# Patient Record
Sex: Female | Born: 1960 | Race: White | Hispanic: No | Marital: Married | Smoking: Former smoker
Health system: Southern US, Community
[De-identification: ages and names within clinical notes are randomized; demographics above are authoritative.]

## PROBLEM LIST (undated history)

## (undated) DIAGNOSIS — D219 Benign neoplasm of connective and other soft tissue, unspecified: Secondary | ICD-10-CM

## (undated) DIAGNOSIS — T8859XA Other complications of anesthesia, initial encounter: Secondary | ICD-10-CM

## (undated) DIAGNOSIS — R7303 Prediabetes: Secondary | ICD-10-CM

## (undated) DIAGNOSIS — F419 Anxiety disorder, unspecified: Secondary | ICD-10-CM

## (undated) DIAGNOSIS — R011 Cardiac murmur, unspecified: Secondary | ICD-10-CM

## (undated) DIAGNOSIS — K219 Gastro-esophageal reflux disease without esophagitis: Secondary | ICD-10-CM

## (undated) DIAGNOSIS — M1990 Unspecified osteoarthritis, unspecified site: Secondary | ICD-10-CM

## (undated) DIAGNOSIS — F32A Depression, unspecified: Secondary | ICD-10-CM

## (undated) DIAGNOSIS — T4145XA Adverse effect of unspecified anesthetic, initial encounter: Secondary | ICD-10-CM

## (undated) DIAGNOSIS — J302 Other seasonal allergic rhinitis: Secondary | ICD-10-CM

## (undated) DIAGNOSIS — E349 Endocrine disorder, unspecified: Secondary | ICD-10-CM

## (undated) DIAGNOSIS — H9190 Unspecified hearing loss, unspecified ear: Secondary | ICD-10-CM

## (undated) DIAGNOSIS — A6920 Lyme disease, unspecified: Secondary | ICD-10-CM

## (undated) HISTORY — DX: Benign neoplasm of connective and other soft tissue, unspecified: D21.9

## (undated) HISTORY — DX: Lyme disease, unspecified: A69.20

## (undated) HISTORY — PX: APPENDECTOMY: SHX54

## (undated) HISTORY — DX: Unspecified osteoarthritis, unspecified site: M19.90

## (undated) HISTORY — PX: COCHLEAR IMPLANT: SUR684

## (undated) HISTORY — DX: Gastro-esophageal reflux disease without esophagitis: K21.9

## (undated) HISTORY — DX: Endocrine disorder, unspecified: E34.9

---

## 1997-12-20 HISTORY — PX: TUBAL LIGATION: SHX77

## 2006-04-17 ENCOUNTER — Emergency Department (HOSPITAL_COMMUNITY): Admission: EM | Admit: 2006-04-17 | Discharge: 2006-04-17 | Payer: Self-pay | Admitting: *Deleted

## 2006-08-24 ENCOUNTER — Ambulatory Visit: Payer: Self-pay | Admitting: Internal Medicine

## 2006-08-26 ENCOUNTER — Other Ambulatory Visit: Admission: RE | Admit: 2006-08-26 | Discharge: 2006-08-26 | Payer: Self-pay | Admitting: Obstetrics & Gynecology

## 2006-09-06 ENCOUNTER — Ambulatory Visit: Payer: Self-pay | Admitting: Internal Medicine

## 2006-10-05 ENCOUNTER — Ambulatory Visit: Payer: Self-pay | Admitting: Internal Medicine

## 2006-12-05 ENCOUNTER — Ambulatory Visit: Payer: Self-pay | Admitting: Internal Medicine

## 2006-12-24 ENCOUNTER — Encounter: Payer: Self-pay | Admitting: Internal Medicine

## 2006-12-24 DIAGNOSIS — K589 Irritable bowel syndrome without diarrhea: Secondary | ICD-10-CM

## 2006-12-24 DIAGNOSIS — G5600 Carpal tunnel syndrome, unspecified upper limb: Secondary | ICD-10-CM

## 2006-12-24 DIAGNOSIS — N949 Unspecified condition associated with female genital organs and menstrual cycle: Secondary | ICD-10-CM

## 2006-12-24 DIAGNOSIS — J309 Allergic rhinitis, unspecified: Secondary | ICD-10-CM | POA: Insufficient documentation

## 2006-12-24 DIAGNOSIS — M129 Arthropathy, unspecified: Secondary | ICD-10-CM | POA: Insufficient documentation

## 2006-12-24 DIAGNOSIS — K219 Gastro-esophageal reflux disease without esophagitis: Secondary | ICD-10-CM | POA: Insufficient documentation

## 2006-12-24 DIAGNOSIS — H8109 Meniere's disease, unspecified ear: Secondary | ICD-10-CM | POA: Insufficient documentation

## 2006-12-24 DIAGNOSIS — E119 Type 2 diabetes mellitus without complications: Secondary | ICD-10-CM

## 2006-12-24 DIAGNOSIS — G43909 Migraine, unspecified, not intractable, without status migrainosus: Secondary | ICD-10-CM | POA: Insufficient documentation

## 2007-07-17 ENCOUNTER — Encounter (INDEPENDENT_AMBULATORY_CARE_PROVIDER_SITE_OTHER): Payer: Self-pay | Admitting: Internal Medicine

## 2008-03-08 ENCOUNTER — Ambulatory Visit: Payer: Self-pay | Admitting: Internal Medicine

## 2008-03-08 DIAGNOSIS — K625 Hemorrhage of anus and rectum: Secondary | ICD-10-CM

## 2008-03-08 LAB — CONVERTED CEMR LAB: Hemoglobin: 12.9 g/dL

## 2009-09-17 ENCOUNTER — Ambulatory Visit (HOSPITAL_COMMUNITY): Admission: RE | Admit: 2009-09-17 | Discharge: 2009-09-17 | Payer: Self-pay | Admitting: Internal Medicine

## 2009-09-25 ENCOUNTER — Ambulatory Visit (HOSPITAL_COMMUNITY): Admission: RE | Admit: 2009-09-25 | Discharge: 2009-09-25 | Payer: Self-pay | Admitting: Internal Medicine

## 2012-08-01 ENCOUNTER — Other Ambulatory Visit (HOSPITAL_COMMUNITY): Payer: Self-pay | Admitting: Internal Medicine

## 2012-08-01 DIAGNOSIS — R109 Unspecified abdominal pain: Secondary | ICD-10-CM

## 2012-08-07 ENCOUNTER — Other Ambulatory Visit (HOSPITAL_COMMUNITY): Payer: Self-pay

## 2012-08-10 ENCOUNTER — Ambulatory Visit (HOSPITAL_COMMUNITY)
Admission: RE | Admit: 2012-08-10 | Discharge: 2012-08-10 | Disposition: A | Discharge status: Home / Self Care | Payer: BC Managed Care – PPO | Source: Ambulatory Visit | Attending: Internal Medicine | Admitting: Internal Medicine

## 2012-08-10 DIAGNOSIS — R110 Nausea: Secondary | ICD-10-CM | POA: Insufficient documentation

## 2012-08-10 DIAGNOSIS — R109 Unspecified abdominal pain: Secondary | ICD-10-CM

## 2013-05-15 ENCOUNTER — Other Ambulatory Visit (HOSPITAL_COMMUNITY): Payer: Self-pay | Admitting: Internal Medicine

## 2013-05-15 DIAGNOSIS — Z139 Encounter for screening, unspecified: Secondary | ICD-10-CM

## 2013-06-07 ENCOUNTER — Ambulatory Visit (HOSPITAL_COMMUNITY)
Admission: RE | Admit: 2013-06-07 | Discharge: 2013-06-07 | Disposition: A | Discharge status: Home / Self Care | Payer: BC Managed Care – PPO | Source: Ambulatory Visit | Attending: Internal Medicine | Admitting: Internal Medicine

## 2013-06-07 DIAGNOSIS — Z1231 Encounter for screening mammogram for malignant neoplasm of breast: Secondary | ICD-10-CM | POA: Insufficient documentation

## 2013-06-07 DIAGNOSIS — Z139 Encounter for screening, unspecified: Secondary | ICD-10-CM

## 2013-10-05 ENCOUNTER — Emergency Department (HOSPITAL_COMMUNITY): Payer: BC Managed Care – PPO

## 2013-10-05 ENCOUNTER — Emergency Department (HOSPITAL_COMMUNITY)
Admission: EM | Admit: 2013-10-05 | Discharge: 2013-10-05 | Disposition: A | Discharge status: Home / Self Care | Payer: BC Managed Care – PPO | Attending: Emergency Medicine | Admitting: Emergency Medicine

## 2013-10-05 ENCOUNTER — Encounter (HOSPITAL_COMMUNITY): Payer: Self-pay | Admitting: Emergency Medicine

## 2013-10-05 ENCOUNTER — Encounter: Payer: Self-pay | Admitting: Gynecology

## 2013-10-05 DIAGNOSIS — S161XXA Strain of muscle, fascia and tendon at neck level, initial encounter: Secondary | ICD-10-CM

## 2013-10-05 DIAGNOSIS — S39012A Strain of muscle, fascia and tendon of lower back, initial encounter: Secondary | ICD-10-CM

## 2013-10-05 DIAGNOSIS — Z79899 Other long term (current) drug therapy: Secondary | ICD-10-CM | POA: Insufficient documentation

## 2013-10-05 DIAGNOSIS — S335XXA Sprain of ligaments of lumbar spine, initial encounter: Secondary | ICD-10-CM | POA: Insufficient documentation

## 2013-10-05 DIAGNOSIS — Y92410 Unspecified street and highway as the place of occurrence of the external cause: Secondary | ICD-10-CM | POA: Insufficient documentation

## 2013-10-05 DIAGNOSIS — S139XXA Sprain of joints and ligaments of unspecified parts of neck, initial encounter: Secondary | ICD-10-CM | POA: Insufficient documentation

## 2013-10-05 DIAGNOSIS — Y9389 Activity, other specified: Secondary | ICD-10-CM | POA: Insufficient documentation

## 2013-10-05 HISTORY — DX: Unspecified hearing loss, unspecified ear: H91.90

## 2013-10-05 MED ORDER — CYCLOBENZAPRINE HCL 10 MG PO TABS
10.0000 mg | ORAL_TABLET | Freq: Once | ORAL | Status: AC
  Administered 2013-10-05: 10 mg via ORAL
  Filled 2013-10-05: qty 1

## 2013-10-05 MED ORDER — CYCLOBENZAPRINE HCL 10 MG PO TABS: 10.0000 mg | ORAL_TABLET | Freq: Three times a day (TID) | ORAL | Status: DC | PRN
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
## 2013-10-05 NOTE — ED Notes (Signed)
Pt seen and eval by EDPa for initial assessment. 
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
## 2013-10-08 NOTE — ED Provider Notes (Signed)
Medical screening examination/treatment/procedure(s) were performed by non-physician practitioner and as supervising physician I was immediately available for consultation/collaboration.   Lou Loewe W Flint Hakeem, MD 10/08/13 2223 

## 2013-10-18 ENCOUNTER — Ambulatory Visit (INDEPENDENT_AMBULATORY_CARE_PROVIDER_SITE_OTHER): Payer: BC Managed Care – PPO | Admitting: Certified Nurse Midwife

## 2013-10-18 ENCOUNTER — Encounter: Payer: Self-pay | Admitting: Certified Nurse Midwife

## 2013-10-18 VITALS — BP 122/86 | HR 80 | Resp 18 | Ht 68.0 in | Wt 220.0 lb

## 2013-10-18 DIAGNOSIS — Z0000 Encounter for general adult medical examination without abnormal findings: Secondary | ICD-10-CM

## 2013-10-18 DIAGNOSIS — B373 Candidiasis of vulva and vagina: Secondary | ICD-10-CM

## 2013-10-18 DIAGNOSIS — Z01419 Encounter for gynecological examination (general) (routine) without abnormal findings: Secondary | ICD-10-CM

## 2013-10-18 DIAGNOSIS — M1990 Unspecified osteoarthritis, unspecified site: Secondary | ICD-10-CM | POA: Insufficient documentation

## 2013-10-18 LAB — POCT URINALYSIS DIPSTICK

## 2013-10-18 MED ORDER — NYSTATIN-TRIAMCINOLONE 100000-0.1 UNIT/GM-% EX CREA: TOPICAL_CREAM | Freq: Two times a day (BID) | CUTANEOUS | Status: DC

## 2013-10-18 NOTE — Progress Notes (Signed)
52 y.o. G6P1001 Married Caucasian Fe here for annual exam.  Periods regular until this month with no period this month so far. Patient has menstrual migraine once monthly,uses Excedrin migraine with great response. Patient is partially deaf, hears slightly, loss started at age 20. Functions well with speech. Patient does follow up as indicated from audiology. Patient complaining of vaginal itching that PCP/ and dermatology had been treating since 5/14. No new personal products. Patient describes as external itching only.  Patient's last menstrual period was 09/03/2013.          Sexually active: yes  The current method of family planning (spouse 23)   Exercising: yes  walking 2-4 miles/wk , lift weights 3x/wk, sit ups 3x/wk Smoker:  no  Health Maintenance: Pap:  2012/2013 Normal  Per patient MMG:  04/2013 negative per patient Colonoscopy:  3 years ago-Normal BMD:   Not recently TDaP:  Not sure  Labs: Hgb: PCP ; Urine: Leuks 2   reports that she has never smoked. She does not have any smokeless tobacco history on file. She reports that she does not drink alcohol or use illicit drugs.  Past Medical History  Diagnosis Date  . Hearing deficit   . Fibroid     Breast  . Hormone disorder   . Arthritis     Past Surgical History  Procedure Laterality Date  . Appendectomy    . Tubal ligation  1991    Current Outpatient Prescriptions  Medication Sig Dispense Refill  . chlorpheniramine (CHLOR-TRIMETON) 4 MG tablet Take 4 mg by mouth daily.      Marland Kitchen esomeprazole (NEXIUM) 20 MG capsule Take 20 mg by mouth daily before breakfast.      . mupirocin ointment (BACTROBAN) 2 % Apply topically as needed.      . Vitamin D, Ergocalciferol, (DRISDOL) 50000 UNITS CAPS capsule Take 50,000 Units by mouth every 7 (seven) days.      . cyclobenzaprine (FLEXERIL) 10 MG tablet Take 1 tablet (10 mg total) by mouth 3 (three) times daily as needed.  21 tablet  0   No current facility-administered medications for  this visit.    Family History  Problem Relation Age of Onset  . Adopted: Yes  . Breast cancer Mother     Late 40's-40's  . Lupus Maternal Aunt     ROS:  Pertinent items are noted in HPI.  Otherwise, a comprehensive ROS was negative.  Exam:   BP 122/86  Pulse 80  Resp 18  Ht 5\' 8"  (1.727 m)  Wt 220 lb (99.791 kg)  BMI 33.46 kg/m2  LMP 09/03/2013 Height: 5\' 8"  (172.7 cm)  Ht Readings from Last 3 Encounters:  10/18/13 5\' 8"  (1.727 m)  10/05/13 5\' 7"  (1.702 m)  12/05/06 5\' 9"  (1.753 m)    General appearance: alert, cooperative and appears stated age Head: Normocephalic, without obvious abnormality, atraumatic Neck: no adenopathy, supple, symmetrical, trachea midline and thyroid normal to inspection and palpation Lungs: clear to auscultation bilaterally Breasts: normal appearance, no masses or tenderness, No nipple retraction or dimpling, No nipple discharge or bleeding, No axillary or supraclavicular adenopathy Heart: regular rate and rhythm Abdomen: soft, non-tender; no masses,  no organomegaly Extremities: extremities normal, atraumatic, no cyanosis or edema Skin: Skin color, texture, turgor normal. No rashes or lesions Lymph nodes: Cervical, supraclavicular, and axillary nodes normal. No abnormal inguinal nodes palpated Neurologic: Grossly normal   Pelvic: External genitalia:  no lesions, red with scaling and exudate.  Urethra:  normal appearing urethra with no masses, tenderness or lesions              Bartholin's and Skene's: normal                 Vagina: normal appearing vagina with normal color and discharge, no lesions              Cervix: normal, non tender              Pap taken: yes Bimanual Exam:  Uterus:  normal size, contour, position, consistency, mobility, non-tender and anteverted              Adnexa: normal adnexa and no mass, fullness, tenderness               Rectovaginal: Confirms               Anus:  normal sphincter tone, no  lesions  A:  Well Woman with normal exam  Contraception none (spouse 70)  Progressive hearing loss ? Etiology being followed by audiologist, must face patient and speak very loud  Yeast vulvitis  P:   Reviewed health and wellness pertinent to exam  Reviewed findings of yeast and probable cause of itching. Rx Mycolog cream see order. Discussed aveeno sitz bath prn comfort if needed. Questions addressed. Change underwear when becomes moist to avoid increase risk of.  pap smear as per guidelines   Mammogram yearly, Discussed genetic screening available with history of early breast cancer in mother(patient adopted, only history given) pap smear taken today with HPVHR counseled on breast self exam, mammography screening, adequate intake of calcium and vitamin D, diet and exercise  return annually  Rv recheck yeast 2 weeks  An After Visit Summary was printed and given to the patient.

## 2013-10-18 NOTE — Patient Instructions (Signed)

## 2013-10-19 NOTE — Progress Notes (Signed)
Note reviewed, agree with plan.  Kataryna Mcquilkin, MD  

## 2013-10-22 LAB — IPS PAP TEST WITH HPV

## 2013-11-02 ENCOUNTER — Ambulatory Visit (INDEPENDENT_AMBULATORY_CARE_PROVIDER_SITE_OTHER): Payer: BC Managed Care – PPO | Admitting: Certified Nurse Midwife

## 2013-11-02 VITALS — BP 100/62 | HR 68 | Resp 16 | Ht 69.0 in | Wt 221.0 lb

## 2013-11-02 DIAGNOSIS — B373 Candidiasis of vulva and vagina: Secondary | ICD-10-CM

## 2013-11-02 NOTE — Progress Notes (Signed)
52 y.o. Married Caucasian female G1P1001 here for follow up of yeast vaginitis treated with Mycolog  initiated on October 18, 2013. Completed all medication as directed.  Denies any symptoms of vaginal itching, burning or discharge. Patient has not started menses yet, but is aware to notify if no menses by mid December. No other health issues  O: Healthy WD,WN female Affect: Normal, hearing impaired  Pelvic exam:EXTERNAL GENITALIA: normal appearing vulva with no masses, tenderness or lesions wet prep negative VAGINA: no abnormal discharge or lesions, Wet Prep/KOH no pathogens and ph 4.0 CERVIX: no lesions or cervical motion tenderness and normal, non tender UTERUS: normal ADNEXA: no masses palpable, nontender and adnexa normal  A:Yeast vulvitis resolved  Perimenopausal with amenorrhea Contraception: spouse vasectomy  P: Discussed findings of negative wet prep, patient reassured. Instructed to call if period occurs or does not occur by mid December.   RV prn

## 2013-11-06 NOTE — Progress Notes (Signed)
Note reviewed, agree with plan.  Jearld Hemp, MD  

## 2013-12-27 ENCOUNTER — Encounter (INDEPENDENT_AMBULATORY_CARE_PROVIDER_SITE_OTHER): Payer: Self-pay | Admitting: *Deleted

## 2014-01-10 ENCOUNTER — Encounter (INDEPENDENT_AMBULATORY_CARE_PROVIDER_SITE_OTHER): Payer: Self-pay | Admitting: *Deleted

## 2014-01-10 ENCOUNTER — Ambulatory Visit (INDEPENDENT_AMBULATORY_CARE_PROVIDER_SITE_OTHER): Payer: BC Managed Care – PPO | Admitting: Internal Medicine

## 2014-01-10 ENCOUNTER — Other Ambulatory Visit (INDEPENDENT_AMBULATORY_CARE_PROVIDER_SITE_OTHER): Payer: Self-pay | Admitting: *Deleted

## 2014-01-10 ENCOUNTER — Encounter (INDEPENDENT_AMBULATORY_CARE_PROVIDER_SITE_OTHER): Payer: Self-pay | Admitting: Internal Medicine

## 2014-01-10 VITALS — BP 112/66 | HR 72 | Temp 98.2°F | Ht 67.0 in | Wt 221.3 lb

## 2014-01-10 DIAGNOSIS — K219 Gastro-esophageal reflux disease without esophagitis: Secondary | ICD-10-CM | POA: Insufficient documentation

## 2014-01-10 NOTE — Patient Instructions (Signed)
EGD with DR. Rehman

## 2014-01-10 NOTE — Progress Notes (Addendum)
Subjective:     Patient ID: Brandi Farley, female   DOB: [birth year], 53 y.o.   MRN: [id number]  HPI Referred to our office by Dr Nevada Crane for uncontrolled GERD. She has been on Protonix Symptoms started about a year ago. She was started on Nexium but really did not help her symptoms. . She says the Protonix relieves her symptoms. She stopped the Protonix for 3 days to see if her symptoms would return and they did.  She had epigastric tenderness.  If feels like someone is poking her. She developed this pain after being under stress. She says it is hard to swallow at time. She does have some nausea. She is not having dysphagia. Appetite is okay. No weight loss. Usually has a BM about once a day.  No melena or bright red rectal bleeding.  06/29/2013 H and H 13.8 and 83.8  Review of Systems see hpi Current Outpatient Prescriptions  Medication Sig Dispense Refill  . Aspirin-Acetaminophen-Caffeine (EXCEDRIN MIGRAINE PO) Take by mouth.      . chlorpheniramine (CHLOR-TRIMETON) 4 MG tablet Take 4 mg by mouth daily.      Marland Kitchen guaiFENesin (MUCINEX) 600 MG 12 hr tablet Take by mouth 2 (two) times daily as needed.      . hydrochlorothiazide (HYDRODIURIL) 25 MG tablet Take 25 mg by mouth daily.      Marland Kitchen ibuprofen (ADVIL,MOTRIN) 200 MG tablet Take 200 mg by mouth every 6 (six) hours as needed.      . mupirocin ointment (BACTROBAN) 2 % Apply topically as needed.      . nystatin-triamcinolone (MYCOLOG II) cream Apply 1 application topically 2 (two) times daily.      Marland Kitchen oxymetazoline (AFRIN) 0.05 % nasal spray Place 1 spray into both nostrils 2 (two) times daily.      . pantoprazole (PROTONIX) 40 MG tablet Take 40 mg by mouth daily.      . Vitamin D, Ergocalciferol, (DRISDOL) 50000 UNITS CAPS capsule Take 50,000 Units by mouth every 7 (seven) days.       No current facility-administered medications for this visit.   Past Medical History  Diagnosis Date  . Hearing deficit   . Fibroid     Breast  . Hormone  disorder   . Arthritis    Past Surgical History  Procedure Laterality Date  . Appendectomy    . Tubal ligation  1991   Allergies  Allergen Reactions  . Bactrim [Sulfamethoxazole-Tmp Ds] Shortness Of Breath and Other (See Comments)    Cramping  . Doryx [Doxycycline] Shortness Of Breath  . Keflex [Cephalexin] Shortness Of Breath     Married, one child. Is a librian     Objective:   Physical Exam  Filed Vitals:   01/10/14 1133  BP: 112/66  Pulse: 72  Temp: 98.2 F (36.8 C)  Height: 5\' 7"  (1.702 m)  Weight: 221 lb 4.8 oz (100.381 kg)   Alert and oriented. Skin warm and dry. Oral mucosa is moist.   . Sclera anicteric, conjunctivae is pink. Thyroid not enlarged. No cervical lymphadenopathy. Lungs clear. Heart regular rate and rhythm.  Abdomen is soft. Bowel sounds are positive. No hepatomegaly. No abdominal masses felt. No tenderness.  No edema to lower extremities.       Assessment:   GERD controlled at this time. PUD needs to be ruled out.     Plan:     EGD with Dr Laural Golden.

## 2014-02-06 ENCOUNTER — Encounter (HOSPITAL_COMMUNITY): Payer: Self-pay | Admitting: *Deleted

## 2014-02-06 ENCOUNTER — Encounter (HOSPITAL_COMMUNITY)
Admission: RE | Disposition: A | Discharge status: Home / Self Care | Payer: Self-pay | Source: Ambulatory Visit | Attending: Internal Medicine

## 2014-02-06 ENCOUNTER — Ambulatory Visit (HOSPITAL_COMMUNITY)
Admission: RE | Admit: 2014-02-06 | Discharge: 2014-02-06 | Disposition: A | Discharge status: Home / Self Care | Payer: BC Managed Care – PPO | Source: Ambulatory Visit | Attending: Internal Medicine | Admitting: Internal Medicine

## 2014-02-06 DIAGNOSIS — R1013 Epigastric pain: Secondary | ICD-10-CM | POA: Insufficient documentation

## 2014-02-06 DIAGNOSIS — K449 Diaphragmatic hernia without obstruction or gangrene: Secondary | ICD-10-CM | POA: Insufficient documentation

## 2014-02-06 DIAGNOSIS — K2960 Other gastritis without bleeding: Secondary | ICD-10-CM

## 2014-02-06 DIAGNOSIS — Z87891 Personal history of nicotine dependence: Secondary | ICD-10-CM | POA: Insufficient documentation

## 2014-02-06 DIAGNOSIS — K219 Gastro-esophageal reflux disease without esophagitis: Secondary | ICD-10-CM

## 2014-02-06 HISTORY — PX: ESOPHAGOGASTRODUODENOSCOPY: SHX5428

## 2014-02-06 HISTORY — DX: Other seasonal allergic rhinitis: J30.2

## 2014-02-06 SURGERY — EGD (ESOPHAGOGASTRODUODENOSCOPY)
Anesthesia: Moderate Sedation

## 2014-02-06 MED ORDER — STERILE WATER FOR IRRIGATION IR SOLN
Status: DC | PRN
  Administered 2014-02-06: 12:00:00

## 2014-02-06 MED ORDER — MEPERIDINE HCL 50 MG/ML IJ SOLN
INTRAMUSCULAR | Status: DC | PRN
  Administered 2014-02-06 (×2): 25 mg via INTRAVENOUS

## 2014-02-06 MED ORDER — SODIUM CHLORIDE 0.9 % IV SOLN
INTRAVENOUS | Status: DC
  Administered 2014-02-06: 12:00:00 via INTRAVENOUS

## 2014-02-06 MED ORDER — MIDAZOLAM HCL 5 MG/5ML IJ SOLN
INTRAMUSCULAR | Status: DC | PRN
  Administered 2014-02-06 (×2): 2 mg via INTRAVENOUS
  Administered 2014-02-06: 1 mg via INTRAVENOUS

## 2014-02-06 MED ORDER — MIDAZOLAM HCL 5 MG/5ML IJ SOLN
INTRAMUSCULAR | Status: AC
  Filled 2014-02-06: qty 10

## 2014-02-06 MED ORDER — BUTAMBEN-TETRACAINE-BENZOCAINE 2-2-14 % EX AERO
INHALATION_SPRAY | CUTANEOUS | Status: DC | PRN
  Administered 2014-02-06: 1 via TOPICAL

## 2014-02-06 MED ORDER — MEPERIDINE HCL 50 MG/ML IJ SOLN
INTRAMUSCULAR | Status: AC
  Filled 2014-02-06: qty 1

## 2014-02-06 NOTE — Discharge Instructions (Signed)
Resume usual medications and diet. No driving for 24 hours. Physician will contact you with results of blood test.  Gastrointestinal Endoscopy Care After Refer to this sheet in the next few weeks. These instructions provide you with information on caring for yourself after your procedure. Your caregiver may also give you more specific instructions. Your treatment has been planned according to current medical practices, but problems sometimes occur. Call your caregiver if you have any problems or questions after your procedure. HOME CARE INSTRUCTIONS  If you were given medicine to help you relax (sedative), do not drive, operate machinery, or sign important documents for 24 hours.  Avoid alcohol and hot or warm beverages for the first 24 hours after the procedure.  Only take over-the-counter or prescription medicines for pain, discomfort, or fever as directed by your caregiver. You may resume taking your normal medicines unless your caregiver tells you otherwise. Ask your caregiver when you may resume taking medicines that may cause bleeding, such as aspirin, clopidogrel, or warfarin.  You may return to your normal diet and activities on the day after your procedure, or as directed by your caregiver. Walking may help to reduce any bloated feeling in your abdomen.  Drink enough fluids to keep your urine clear or pale yellow.  You may gargle with salt water if you have a sore throat. SEEK IMMEDIATE MEDICAL CARE IF:  You have severe nausea or vomiting.  You have severe abdominal pain, abdominal cramps that last longer than 6 hours, or abdominal swelling (distention).  You have severe shoulder or back pain.  You have trouble swallowing.  You have shortness of breath, your breathing is shallow, or you are breathing faster than normal.  You have a fever or a rapid heartbeat.  You vomit blood or material that looks like coffee grounds.  You have bloody, black, or tarry stools. MAKE SURE  YOU:  Understand these instructions.  Will watch your condition.  Will get help right away if you are not doing well or get worse.

## 2014-02-06 NOTE — H&P (Signed)
Brandi Farley is an 53 y.o. female.   Chief Complaint: Patient is here for EGD. HPI: Patient is 53 year old Caucasian female who presents with intermittent epigastric pain few months duration. She describes as if somebody is poking this area with knife. He has history of heartburn. Nexium did not help but she is doing better with pantoprazole. She denies dysphagia nausea vomiting melena or rectal bleeding. She rarely takes OTC Advil. Ultrasound on August 2013 was negative for cholelithiasis .  Past Medical History  Diagnosis Date  . Hearing deficit   . Fibroid     Breast  . Hormone disorder   . Arthritis   . Seasonal allergies     Past Surgical History  Procedure Laterality Date  . Appendectomy    . Tubal ligation  1991    Family History  Problem Relation Age of Onset  . Adopted: Yes  . Breast cancer Mother     Late 30's-40's, brain tumor  . Lupus Maternal Aunt    Social History:  reports that she has quit smoking. She does not have any smokeless tobacco history on file. She reports that she does not drink alcohol or use illicit drugs.  Allergies:  Allergies  Allergen Reactions  . Bactrim [Sulfamethoxazole-Tmp Ds] Shortness Of Breath and Other (See Comments)    Cramping  . Doryx [Doxycycline] Shortness Of Breath  . Keflex [Cephalexin] Shortness Of Breath    Medications Prior to Admission  Medication Sig Dispense Refill  . cetirizine (ZYRTEC) 10 MG tablet Take 10 mg by mouth daily as needed for allergies.      . chlorpheniramine (CHLOR-TRIMETON) 4 MG tablet Take 4 mg by mouth daily.      . hydrochlorothiazide (HYDRODIURIL) 25 MG tablet Take 25 mg by mouth daily.      . mupirocin ointment (BACTROBAN) 2 % Apply topically as needed.      . nystatin-triamcinolone (MYCOLOG II) cream Apply 1 application topically 2 (two) times daily.      Marland Kitchen oxymetazoline (AFRIN) 0.05 % nasal spray Place 1 spray into both nostrils 2 (two) times daily.      . pantoprazole (PROTONIX) 40 MG  tablet Take 40 mg by mouth daily.      . Vitamin D, Ergocalciferol, (DRISDOL) 50000 UNITS CAPS capsule Take 50,000 Units by mouth every 7 (seven) days.      . Aspirin-Acetaminophen-Caffeine (EXCEDRIN MIGRAINE PO) Take by mouth.      Marland Kitchen guaiFENesin (MUCINEX) 600 MG 12 hr tablet Take by mouth 2 (two) times daily as needed.      Marland Kitchen ibuprofen (ADVIL,MOTRIN) 200 MG tablet Take 200 mg by mouth every 6 (six) hours as needed.        No results found for this or any previous visit (from the past 48 hour(s)). No results found.  ROS  Blood pressure 135/71, pulse 65, temperature 97.9 F (36.6 C), temperature source Oral, resp. rate 15, height 5\' 7"  (1.702 m), weight 221 lb (100.245 kg), last menstrual period 02/01/2014, SpO2 98.00%. Physical Exam  Constitutional: She appears well-developed and well-nourished.  HENT:  Mouth/Throat: Oropharynx is clear and moist.  Eyes: Conjunctivae are normal. No scleral icterus.  Neck: No thyromegaly present.  Cardiovascular: Normal rate, regular rhythm and normal heart sounds.   No murmur heard. Respiratory: Effort normal and breath sounds normal.  GI: Soft. She exhibits no distension and no mass. There is no tenderness.  Musculoskeletal: She exhibits no edema.  Lymphadenopathy:    She has no cervical adenopathy.  Neurological: She is alert.  Skin: Skin is warm and dry.     Assessment/Plan Epigastric pain. Chronic GERD. Diagnostic EGD.  Brandi Farley 02/06/2014, 12:14 PM

## 2014-02-06 NOTE — Op Note (Signed)
EGD PROCEDURE REPORT  PATIENT:  Brandi Farley  MR#:  [id number] Birthdate:  20-Mar-1961, 53 y.o., female Endoscopist:  Dr. Rogene Houston, MD Referred By:  Dr. Wende Neighbors, MD Procedure Date: 02/06/2014  Procedure:   EGD  Indications:  Patient is 53 year old Caucasian female who presents with recurrent epigastric pain. 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 Ultrasound 18 months ago was negative for cholelithiasis.            Informed Consent:  The risks, benefits, alternatives & imponderables which include, but are not limited to, bleeding, infection, perforation, drug reaction and potential missed lesion have been reviewed.  The potential for biopsy, lesion removal, esophageal dilation, etc. have also been discussed.  Questions have been answered.  All parties agreeable.  Please see history & physical in medical record for more information.  Medications:  Demerol 50 mg IV Versed 5 mg IV Cetacaine spray topically for oropharyngeal anesthesia  Description of procedure:  The endoscope was introduced through the mouth and advanced to the second portion of the duodenum without difficulty or limitations. The mucosal surfaces were surveyed very carefully during advancement of the scope and upon withdrawal.  Findings:  Esophagus:  Mucosa of the esophagus was normal. GE junction was unremarkable. GEJ:  35 cm Hiatus:  39 cm Stomach: Stomach was empty and distended very well with insufflation. Folds in the proximal stomach were normal. Examination of mucosa at gastric body was normal. Few small antral erosions noted but no ulcer crater identified. Pyloric channel was patent. Angularis fundus and cardia were unremarkable. Duodenum:  Normal bulbar and post bulbar mucosa.  Therapeutic/Diagnostic Maneuvers Performed:  None  Complications:  None  Impression: Small sliding hiatal hernia. Erosive antral gastritis but no evidence of peptic ulcer disease.  Recommendations:   H. pylori serology. Patient will continue anti-reflux measures and pantoprazole as before.  Burney Calzadilla U  02/06/2014  12:34 PM  CC: Dr. Delphina Cahill, MD & Dr. Rayne Du ref. provider found

## 2014-02-07 ENCOUNTER — Encounter (HOSPITAL_COMMUNITY): Payer: Self-pay | Admitting: Internal Medicine

## 2014-02-07 LAB — H. PYLORI ANTIBODY, IGG: H Pylori IgG: <0.4 {ISR}

## 2014-02-13 ENCOUNTER — Telehealth (INDEPENDENT_AMBULATORY_CARE_PROVIDER_SITE_OTHER): Payer: Self-pay | Admitting: *Deleted

## 2014-02-13 NOTE — Telephone Encounter (Signed)
Would like to get her lab results. Dr. Laural Golden order blood work when she was at the hosptial for her procedure. Alfa would like for her husband to be called and given the results. She is at work during the day and death. It takes a little longer to speak with her. The return phone number is [phone].

## 2014-02-13 NOTE — Telephone Encounter (Signed)
I called and spoke with the patient's husband. I gave him the results of the H- Pylori ,Negative. I told him that his wife was to continue Anti Reflux Measures and to take the Pantoprazole as before. He was told that she Erosive Gastris but no peptic ulcer disease noted. He ask if she was to have any further test since nothing was found to cause her problems? Mr. Altman was advised that this would be addressed with Dr.Rehman, any further recommendations we would return the call.

## 2014-02-15 NOTE — Telephone Encounter (Signed)
Patient,s call return. There no answer when I called Patient's home number and her husband's number earlier in the week.

## 2014-03-11 ENCOUNTER — Ambulatory Visit (INDEPENDENT_AMBULATORY_CARE_PROVIDER_SITE_OTHER): Payer: BC Managed Care – PPO | Admitting: Certified Nurse Midwife

## 2014-03-11 ENCOUNTER — Encounter: Payer: Self-pay | Admitting: Certified Nurse Midwife

## 2014-03-11 VITALS — BP 118/64 | HR 68 | Resp 16 | Ht 69.0 in | Wt 226.0 lb

## 2014-03-11 DIAGNOSIS — L723 Sebaceous cyst: Secondary | ICD-10-CM

## 2014-03-11 DIAGNOSIS — B372 Candidiasis of skin and nail: Secondary | ICD-10-CM

## 2014-03-11 NOTE — Patient Instructions (Signed)
Epidermal Cyst An epidermal cyst is sometimes called a sebaceous cyst, epidermal inclusion cyst, or infundibular cyst. These cysts usually contain a substance that looks "pasty" or "cheesy" and may have a bad smell. This substance is a protein called keratin. Epidermal cysts are usually found on the face, neck, or trunk. They may also occur in the vaginal area or other parts of the genitalia of both men and women. Epidermal cysts are usually small, painless, slow-growing bumps or lumps that move freely under the skin. It is important not to try to pop them. This may cause an infection and lead to tenderness and swelling. CAUSES  Epidermal cysts may be caused by a deep penetrating injury to the skin or a plugged hair follicle, often associated with acne. SYMPTOMS  Epidermal cysts can become inflamed and cause:  Redness.  Tenderness.  Increased temperature of the skin over the bumps or lumps.  Grayish-white, bad smelling material that drains from the bump or lump. DIAGNOSIS  Epidermal cysts are easily diagnosed by your caregiver during an exam. Rarely, a tissue sample (biopsy) may be taken to rule out other conditions that may resemble epidermal cysts. TREATMENT   Epidermal cysts often get better and disappear on their own. They are rarely ever cancerous.  If a cyst becomes infected, it may become inflamed and tender. This may require opening and draining the cyst. Treatment with antibiotics may be necessary. When the infection is gone, the cyst may be removed with minor surgery.  Small, inflamed cysts can often be treated with antibiotics or by injecting steroid medicines.  Sometimes, epidermal cysts become large and bothersome. If this happens, surgical removal in your caregiver's office may be necessary. HOME CARE INSTRUCTIONS  Only take over-the-counter or prescription medicines as directed by your caregiver.  Epsom salt soak an do squeeze area SEEK MEDICAL CARE IF:   Your cyst  becomes tender, red, or swollen.  Your condition is not improving or is getting worse.  You have any other questions or concerns. MAKE SURE YOU:  Understand these instructions.  Will watch your condition.  Will get help right away if you are not doing well or get worse. Document Released: 11/06/2004 Document Revised: 02/28/2012 Document Reviewed: 06/14/2011 [facility] Patient Information 2014 [place], [place]. Monilial Vaginitis Vaginitis in a soreness, swelling and redness (inflammation) of the vagina and vulva. Monilial vaginitis is not a sexually transmitted infection. CAUSES  Yeast vaginitis is caused by yeast (candida) that is normally found in your vagina. With a yeast infection, the candida has overgrown in number to a point that upsets the chemical balance. SYMPTOMS   White, thick vaginal discharge.  Swelling, itching, redness and irritation of the vagina and possibly the lips of the vagina (vulva).  Burning or painful urination.  Painful intercourse. DIAGNOSIS  Things that may contribute to monilial vaginitis are:  Postmenopausal and virginal states.  Pregnancy.  Infections.  Being tired, sick or stressed, especially if you had monilial vaginitis in the past.  Diabetes. Good control will help lower the chance.  Birth control pills.  Tight fitting garments.  Using bubble bath, feminine sprays, douches or deodorant tampons.  Taking certain medications that kill germs (antibiotics).  Sporadic recurrence can occur if you become ill. TREATMENT  Your caregiver will give you medication.  There are several kinds of anti monilial vaginal creams and suppositories specific for monilial vaginitis. For recurrent yeast infections, use a suppository or cream in the vagina 2 times a week, or as directed.  Anti-monilial or  steroid cream for the itching or irritation of the vulva may also be used. Get your caregiver's permission.  Painting the vagina with methylene blue  solution may help if the monilial cream does not work.  Eating yogurt may help prevent monilial vaginitis. HOME CARE INSTRUCTIONS   Finish all medication as prescribed.  Do not have sex until treatment is completed or after your caregiver tells you it is okay.  Take warm sitz baths.  Do not douche.  Do not use tampons, especially scented ones.  Wear cotton underwear.  Avoid tight pants and panty hose.  Tell your sexual partner that you have a yeast infection. They should go to their caregiver if they have symptoms such as mild rash or itching.  Your sexual partner should be treated as well if your infection is difficult to eliminate.  Practice safer sex. Use condoms.  Some vaginal medications cause latex condoms to fail. Vaginal medications that harm condoms are:  Cleocin cream.  Butoconazole (Femstat).  Terconazole (Terazol) vaginal suppository.  Miconazole (Monistat) (may be purchased over the counter). SEEK MEDICAL CARE IF:   You have a temperature by mouth above 102 F (38.9 C).  The infection is getting worse after 2 days of treatment.  The infection is not getting better after 3 days of treatment.  You develop blisters in or around your vagina.  You develop vaginal bleeding, and it is not your menstrual period.  You have pain when you urinate.  You develop intestinal problems.  You have pain with sexual intercourse. Document Released: 09/15/2005 Document Revised: 02/28/2012 Document Reviewed: 05/30/2009 [facility] Patient Information 2014 [place], [place].

## 2014-03-11 NOTE — Progress Notes (Signed)
52 y.o.Married Caucasian female G1P1001 with a off and on 2 months history of the following:vulvar itching, no vaginal itching or discharge or burning with period each month. Patient uses pads for period. Patient is not on period today. Sexually active: yes  Pt also reports the following associated symptoms: small bump on pubic hair area when this occurs with period. Patient has tried over the counter treatment of Monistat internal cream with minimal relief. No new personal products .No other health issues.  O: Healthy female WDWN Affect: normal, orientation x 3 Bilateral hearing impaired with hearing aids. Verbalized understanding of questions.    Exam:Mons pubis area on right small healing sebaceous cyst noted, not red,non tender, no exudate Inguinal lymph nodes not enlarged or tender   KCM:[id number], Urethra, Skene's normal, vulva slightly red with scaling, wet prep taken  no exudate, non tender, no lesions                Vag:no lesions, discharge: normal and physiologic, pH 3.5, wet prep done                Cx:  normal appearance and non tender                Uterus:normal size, mid position                Adnexa: normal adnexa and no mass, fullness, tenderness  Wet Prep shows:negative vaginal, positvie yeast external vulva   A: Yeast dermatitis of vulva Sebaceous cyst   P: Reviewed findings of yeast dermatitis as she has had before. Patient still has Rx Mycolg cream and instructed to apply bid X 5 days to area.  Aveeno sitz bath prn.  Try changing menstrual pads more frequently or a different brand. Reviewed findings and usually will resolve without treatment. Avoid squeezing area. Can put epsom salt soak to area to help with resolving when occurs. Questions addressed. Handout given regarding both concerns.   Rv prn

## 2014-03-11 NOTE — Progress Notes (Signed)
Reviewed personally.  M. Suzanne Sebastian Dzik, MD.  

## 2014-03-18 ENCOUNTER — Other Ambulatory Visit: Payer: Self-pay | Admitting: Certified Nurse Midwife

## 2014-03-18 NOTE — Telephone Encounter (Signed)
Last AEX 10/18/2013 Last refill 10/18/2013 # 60g/0 refills No future appt.  Please approve or deny Rx.

## 2014-05-24 ENCOUNTER — Other Ambulatory Visit: Payer: Self-pay | Admitting: Obstetrics & Gynecology

## 2014-05-24 MED ORDER — NYSTATIN-TRIAMCINOLONE 100000-0.1 UNIT/GM-% EX CREA: 1.0000 "application " | TOPICAL_CREAM | Freq: Two times a day (BID) | CUTANEOUS | Status: DC

## 2014-05-24 NOTE — Telephone Encounter (Signed)
Patient request a refill of "vaginal cream", patient is not sure of the name. Pharmacy on file.

## 2014-05-24 NOTE — Telephone Encounter (Signed)
Last refilled 03/11/14 at [facility] Last AEX: 10/18/13  No AEX scheduled  Please advise

## 2014-05-27 NOTE — Telephone Encounter (Signed)
Patient notified that rx has been sent. 

## 2014-06-13 ENCOUNTER — Encounter (INDEPENDENT_AMBULATORY_CARE_PROVIDER_SITE_OTHER): Payer: Self-pay

## 2014-08-16 ENCOUNTER — Other Ambulatory Visit: Payer: Self-pay | Admitting: Certified Nurse Midwife

## 2014-08-16 NOTE — Telephone Encounter (Signed)
Last AEX 10/18/13 Last refill 05/24/14 30g/0 R No future appt  Please approve

## 2014-10-21 ENCOUNTER — Encounter: Payer: Self-pay | Admitting: Certified Nurse Midwife

## 2014-12-03 ENCOUNTER — Other Ambulatory Visit: Payer: Self-pay | Admitting: Nurse Practitioner

## 2014-12-03 NOTE — Telephone Encounter (Signed)
Medication refill request: Mycolog II Last AEX:  10/18/13 with Ms. Debbie Next AEX:  AEX scheduled for 12/27/13 with Ms. Debbie  Last MMG (if hormonal medication request): N/A Refill authorized: #30 gm   S/w patient and scheduled her for AEX 12/27/13 at 2:15, patient was wondering if Ms. Debbie could send in a refill to last her until AEX, she only has a little bit left.  Please advise.

## 2014-12-27 ENCOUNTER — Ambulatory Visit: Payer: BC Managed Care – PPO | Admitting: Certified Nurse Midwife

## 2015-01-29 IMAGING — CR DG CERVICAL SPINE COMPLETE 4+V
6 series · 6 of 6 positions shown · non-contrast
Comparison: None.

CLINICAL DATA: Pain post trauma

EXAM:
CERVICAL SPINE  4+ VIEWS

[view not recorded (1 of 6)]
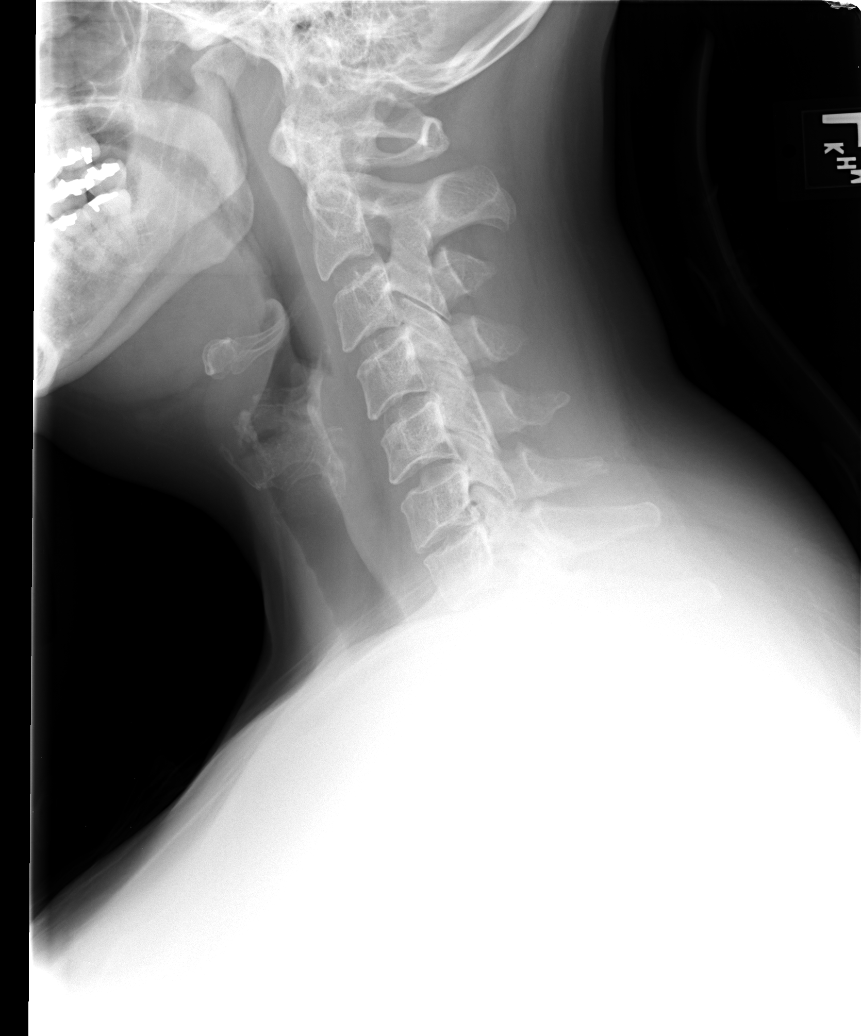

[view not recorded (2 of 6)]
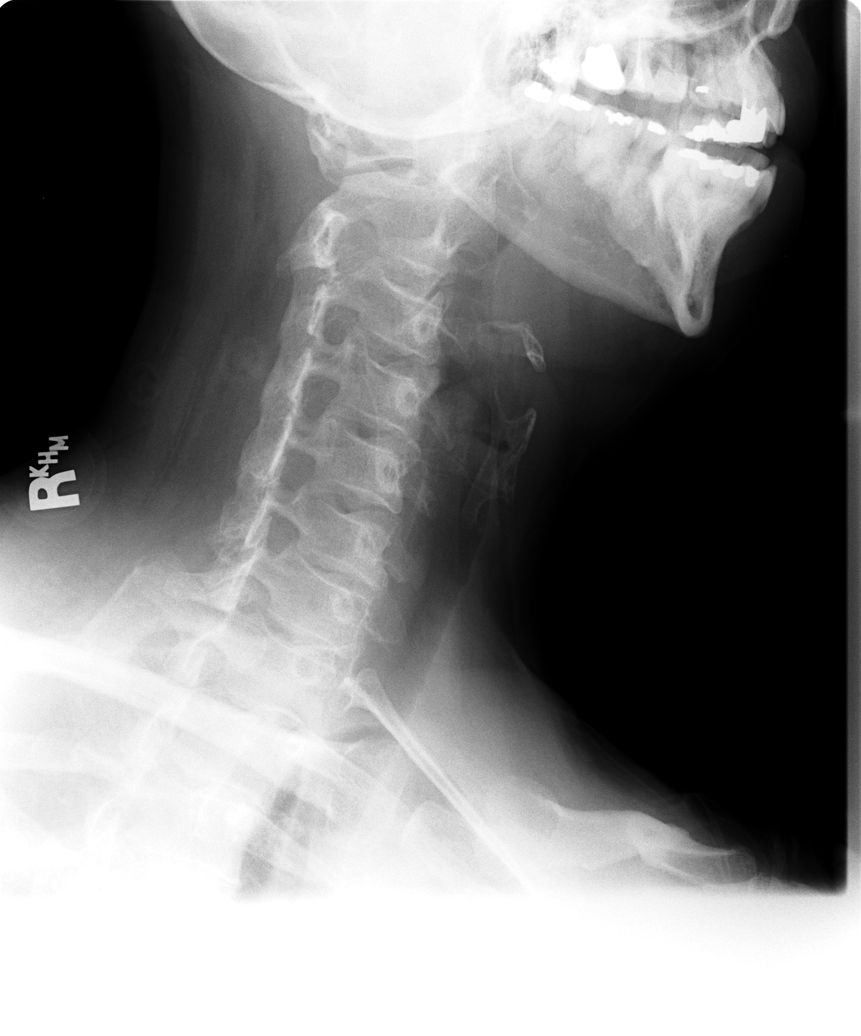

[view not recorded (3 of 6)]
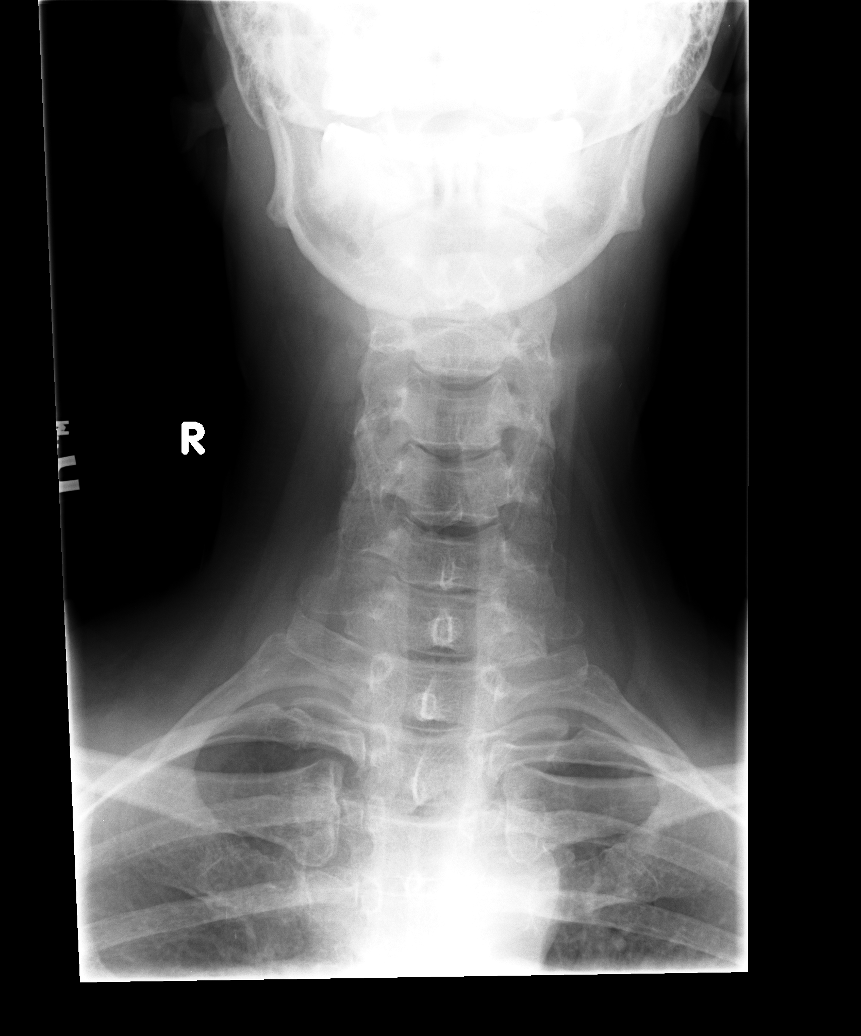

[view not recorded (4 of 6)]
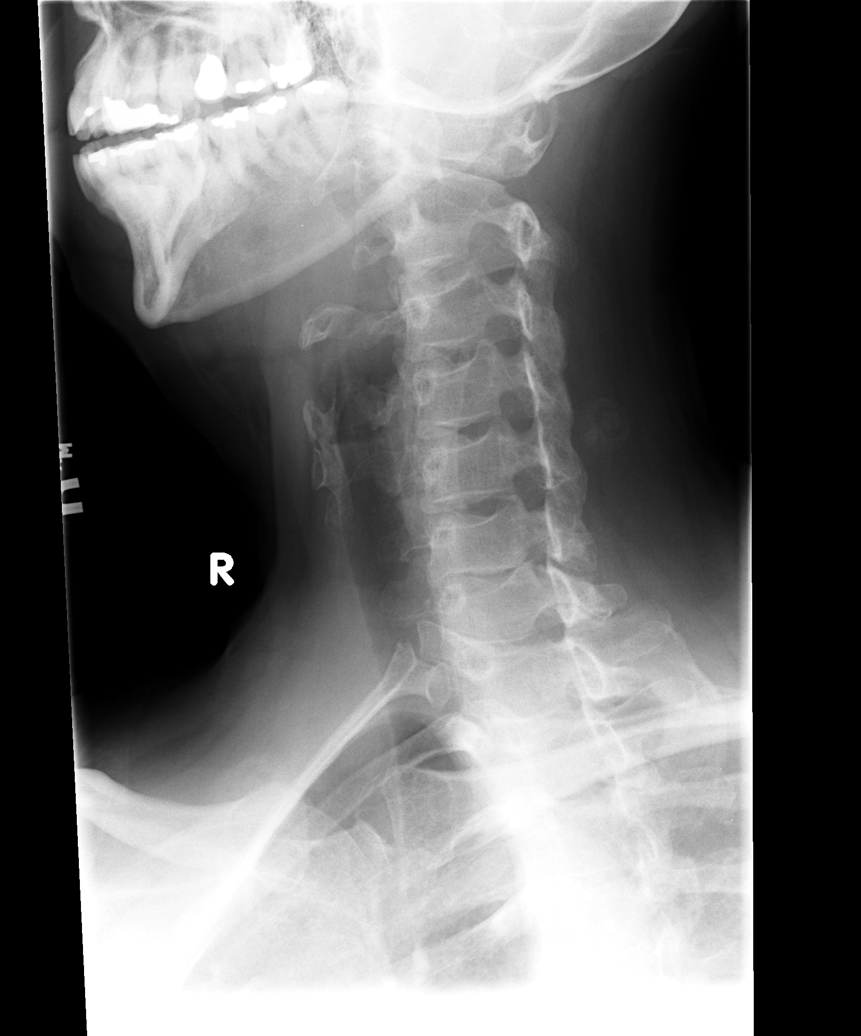

[view not recorded (5 of 6)]
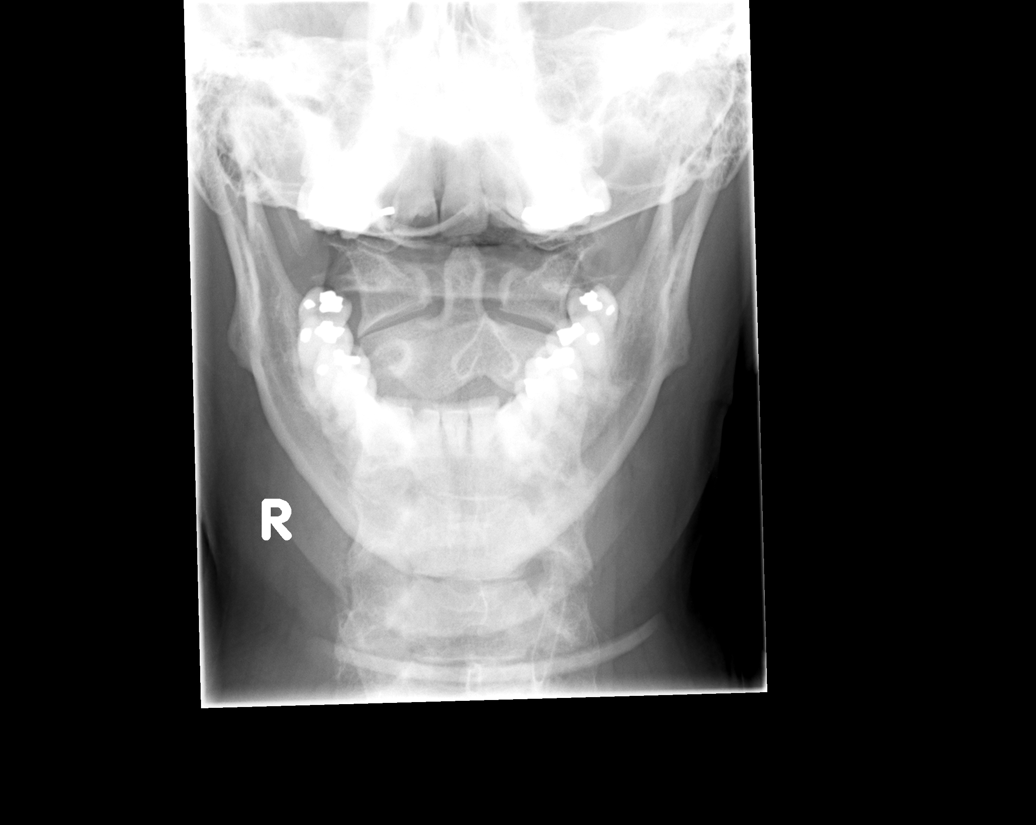

[view not recorded (6 of 6)]
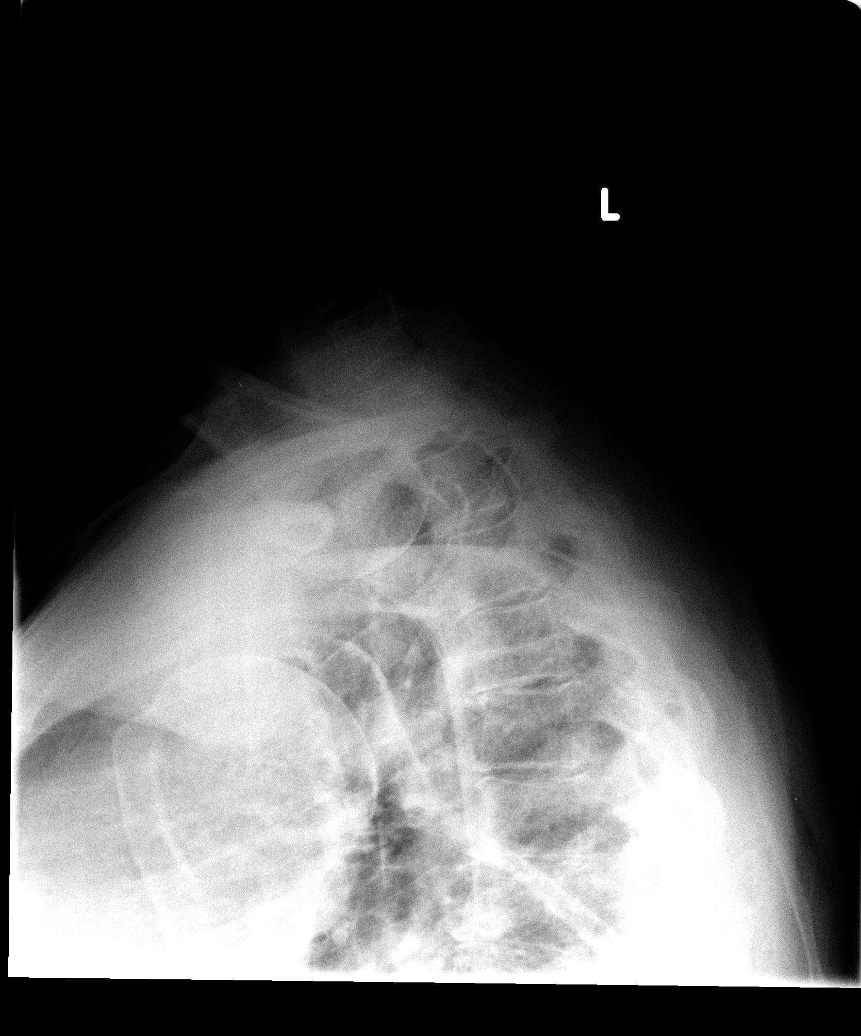

[6 of 6 positions shown; findings below may reference images not displayed]

FINDINGS: Frontal, lateral, open-mouth odontoid, and bilateral oblique views
were obtained. There is no fracture or spondylolisthesis.
Prevertebral soft tissues and predental space regions are normal.
There is moderate disc space narrowing at C6-7. Other disc spaces
appear normal. There is the exit foraminal narrowing on the left at
C6-7 due to bony hypertrophy.
IMPRESSION: Osteoarthritic changes C6-7. No fracture or spondylolisthesis.

## 2015-02-21 ENCOUNTER — Encounter: Payer: Self-pay | Admitting: Certified Nurse Midwife

## 2015-02-21 ENCOUNTER — Ambulatory Visit (INDEPENDENT_AMBULATORY_CARE_PROVIDER_SITE_OTHER): Payer: BC Managed Care – PPO | Admitting: Certified Nurse Midwife

## 2015-02-21 VITALS — BP 118/62 | HR 72 | Resp 16 | Ht 67.75 in | Wt 234.0 lb

## 2015-02-21 DIAGNOSIS — Z0000 Encounter for general adult medical examination without abnormal findings: Secondary | ICD-10-CM | POA: Diagnosis not present

## 2015-02-21 DIAGNOSIS — Z124 Encounter for screening for malignant neoplasm of cervix: Secondary | ICD-10-CM

## 2015-02-21 DIAGNOSIS — Z01419 Encounter for gynecological examination (general) (routine) without abnormal findings: Secondary | ICD-10-CM

## 2015-02-21 DIAGNOSIS — N912 Amenorrhea, unspecified: Secondary | ICD-10-CM

## 2015-02-21 DIAGNOSIS — N951 Menopausal and female climacteric states: Secondary | ICD-10-CM

## 2015-02-21 LAB — POCT URINALYSIS DIPSTICK
BILIRUBIN UA: NEGATIVE
Blood, UA: NEGATIVE
Glucose, UA: NEGATIVE
KETONES UA: NEGATIVE
LEUKOCYTES UA: NEGATIVE
NITRITE UA: NEGATIVE
Protein, UA: NEGATIVE
UROBILINOGEN UA: NEGATIVE
pH, UA: 5

## 2015-02-21 NOTE — Patient Instructions (Signed)
EXERCISE AND DIET:  We recommended that you start or continue a regular exercise program for good health. Regular exercise means any activity that makes your heart beat faster and makes you sweat.  We recommend exercising at least 30 minutes per day at least 3 days a week, preferably 4 or 5.  We also recommend a diet low in fat and sugar.  Inactivity, poor dietary choices and obesity can cause diabetes, heart attack, stroke, and kidney damage, among others.    ALCOHOL AND SMOKING:  Women should limit their alcohol intake to no more than 7 drinks/beers/glasses of wine (combined, not each!) per week. Moderation of alcohol intake to this level decreases your risk of breast cancer and liver damage. And of course, no recreational drugs are part of a healthy lifestyle.  And absolutely no smoking or even second hand smoke. Most people know smoking can cause heart and lung diseases, but did you know it also contributes to weakening of your bones? Aging of your skin?  Yellowing of your teeth and nails?  CALCIUM AND VITAMIN D:  Adequate intake of calcium and Vitamin D are recommended.  The recommendations for exact amounts of these supplements seem to change often, but generally speaking 600 mg of calcium (either carbonate or citrate) and 800 units of Vitamin D per day seems prudent. Certain women may benefit from higher intake of Vitamin D.  If you are among these women, your doctor will have told you during your visit.    PAP SMEARS:  Pap smears, to check for cervical cancer or precancers,  have traditionally been done yearly, although recent scientific advances have shown that most women can have pap smears less often.  However, every woman still should have a physical exam from her gynecologist every year. It will include a breast check, inspection of the vulva and vagina to check for abnormal growths or skin changes, a visual exam of the cervix, and then an exam to evaluate the size and shape of the uterus and  ovaries.  And after 52 years of age, a rectal exam is indicated to check for rectal cancers. We will also provide age appropriate advice regarding health maintenance, like when you should have certain vaccines, screening for sexually transmitted diseases, bone density testing, colonoscopy, mammograms, etc.   MAMMOGRAMS:  All women over 52 years old should have a yearly mammogram. Many facilities now offer a "3D" mammogram, which may cost around $50 extra out of pocket. If possible,  we recommend you accept the option to have the 3D mammogram performed.  It both reduces the number of women who will be called back for extra views which then turn out to be normal, and it is better than the routine mammogram at detecting truly abnormal areas.    COLONOSCOPY:  Colonoscopy to screen for colon cancer is recommended for all women at age 52.  We know, you hate the idea of the prep.  We agree, BUT, having colon cancer and not knowing it is worse!!  Colon cancer so often starts as a polyp that can be seen and removed at colonscopy, which can quite literally save your life!  And if your first colonoscopy is normal and you have no family history of colon cancer, most women don't have to have it again for 10 years.  Once every ten years, you can do something that may end up saving your life, right?  We will be happy to help you get it scheduled when you are ready.    Be sure to check your insurance coverage so you understand how much it will cost.  It may be covered as a preventative service at no cost, but you should check your particular policy.     Perimenopause Perimenopause is the time when your body begins to move into the menopause (no menstrual period for 12 straight months). It is a natural process. Perimenopause can begin 2-8 years before the menopause and usually lasts for 1 year after the menopause. During this time, your ovaries may or may not produce an egg. The ovaries vary in their production of estrogen and  progesterone hormones each month. This can cause irregular menstrual periods, difficulty getting pregnant, vaginal bleeding between periods, and uncomfortable symptoms. CAUSES  Irregular production of the ovarian hormones, estrogen and progesterone, and not ovulating every month.  Other causes include:  Tumor of the pituitary gland in the brain.  Medical disease that affects the ovaries.  Radiation treatment.  Chemotherapy.  Unknown causes.  Heavy smoking and excessive alcohol intake can bring on perimenopause sooner. SIGNS AND SYMPTOMS   Hot flashes.  Night sweats.  Irregular menstrual periods.  Decreased sex drive.  Vaginal dryness.  Headaches.  Mood swings.  Depression.  Memory problems.  Irritability.  Tiredness.  Weight gain.  Trouble getting pregnant.  The beginning of losing bone cells (osteoporosis).  The beginning of hardening of the arteries (atherosclerosis). DIAGNOSIS  Your health care provider will make a diagnosis by analyzing your age, menstrual history, and symptoms. He or she will do a physical exam and note any changes in your body, especially your female organs. Female hormone tests may or may not be helpful depending on the amount of female hormones you produce and when you produce them. However, other hormone tests may be helpful to rule out other problems. TREATMENT  In some cases, no treatment is needed. The decision on whether treatment is necessary during the perimenopause should be made by you and your health care provider based on how the symptoms are affecting you and your lifestyle. Various treatments are available, such as:  Treating individual symptoms with a specific medicine for that symptom.  Herbal medicines that can help specific symptoms.  Counseling.  Group therapy. HOME CARE INSTRUCTIONS   Keep track of your menstrual periods (when they occur, how heavy they are, how long between periods, and how long they last) as  well as your symptoms and when they started.  Only take over-the-counter or prescription medicines as directed by your health care provider.  Sleep and rest.  Exercise.  Eat a diet that contains calcium (good for your bones) and soy (acts like the estrogen hormone).  Do not smoke.  Avoid alcoholic beverages.  Take vitamin supplements as recommended by your health care provider. Taking vitamin E may help in certain cases.  Take calcium and vitamin D supplements to help prevent bone loss.  Group therapy is sometimes helpful.  Acupuncture may help in some cases. SEEK MEDICAL CARE IF:   You have questions about any symptoms you are having.  You need a referral to a specialist (gynecologist, psychiatrist, or psychologist). SEEK IMMEDIATE MEDICAL CARE IF:   You have vaginal bleeding.  Your period lasts longer than 8 days.  Your periods are recurring sooner than 21 days.  You have bleeding after intercourse.  You have severe depression.  You have pain when you urinate.  You have severe headaches.  You have vision problems. Document Released: 01/13/2005 Document Revised: 09/26/2013 Document Reviewed: 07/05/2013 [employer]  Patient Information 2015 [employer]. This information is not intended to replace advice given to you by your health care provider. Make sure you discuss any questions you have with your health care provider.  

## 2015-02-21 NOTE — Progress Notes (Signed)
54 y.o. G71P1001 Married  Caucasian Fe here for annual exam. Periods irregular prior to 12/15. This is the longest she has gone without cycle. Patient feels like period will start. Denies hot flashes or night sweats. Hearing loss has increased, needs Cochlear implant. Staying busy with work, reading lips working well for patient at this point, still using hearing aids. Sees PCP for type 2 diabetes management and aex/labs. No health concerns today.  Patient's last menstrual period was 12/13/2014.          Sexually active: Yes.    The current method of family planning is tubal ligation.    Exercising: Yes.    walking Smoker:  no  Health Maintenance: Pap: 10-18-13 neg HPV HR neg MMG: 06-07-13 category b density,birads 1:neg Colonoscopy:  2011 neg. Per patient BMD:   Not recently previous normal per patient TDaP:  2015 Labs: Poct urine-neg Self breast exam: daily   reports that she has quit smoking. She does not have any smokeless tobacco history on file. She reports that she does not drink alcohol or use illicit drugs.  Past Medical History  Diagnosis Date  . Hearing deficit   . Fibroid     Breast  . Hormone disorder   . Arthritis   . Seasonal allergies   . Acid reflux     Past Surgical History  Procedure Laterality Date  . Appendectomy    . Tubal ligation  1991  . Esophagogastroduodenoscopy N/A 02/06/2014    Procedure: ESOPHAGOGASTRODUODENOSCOPY (EGD);  Surgeon: Rogene Houston, MD;  Location: [facility];  Service: Endoscopy;  Laterality: N/A;  1200    Current Outpatient Prescriptions  Medication Sig Dispense Refill  . Aspirin-Acetaminophen-Caffeine (EXCEDRIN MIGRAINE PO) Take by mouth.    . cetirizine (ZYRTEC) 10 MG tablet Take 10 mg by mouth daily as needed for allergies.    . chlorpheniramine (CHLOR-TRIMETON) 4 MG tablet Take 4 mg by mouth as needed.     . diclofenac (VOLTAREN) 75 MG EC tablet   2  . guaiFENesin (MUCINEX) 600 MG 12 hr tablet Take by mouth 2 (two) times  daily as needed.    . hydrochlorothiazide (HYDRODIURIL) 25 MG tablet Take 25 mg by mouth daily.    Marland Kitchen ibuprofen (ADVIL,MOTRIN) 200 MG tablet Take 200 mg by mouth every 6 (six) hours as needed.    . metaxalone (SKELAXIN) 800 MG tablet   0  . mupirocin ointment (BACTROBAN) 2 % Apply topically as needed.    . nystatin (MYCOSTATIN/NYSTOP) 100000 UNIT/GM POWD     . nystatin-triamcinolone (MYCOLOG II) cream APPLY TO THE AFFECTED AREA TWICE DAILY 30 g 0  . pantoprazole (PROTONIX) 40 MG tablet Take 40 mg by mouth daily.    . Vitamin D, Ergocalciferol, (DRISDOL) 50000 UNITS CAPS capsule Take 50,000 Units by mouth every 7 (seven) days.     No current facility-administered medications for this visit.    Family History  Problem Relation Age of Onset  . Adopted: Yes  . Breast cancer Mother     Late 30's-40's, brain tumor  . Lupus Maternal Aunt     ROS:  Pertinent items are noted in HPI.  Otherwise, a comprehensive ROS was negative.  Exam:   BP 118/62 mmHg  Pulse 72  Resp 16  Ht 5' 7.75" (1.721 m)  Wt 234 lb (106.142 kg)  BMI 35.84 kg/m2  LMP 12/13/2014 Height: 5' 7.75" (172.1 cm) Ht Readings from Last 3 Encounters:  02/21/15 5' 7.75" (1.721 m)  03/11/14 5'  9" (1.753 m)  02/06/14 5\' 7"  (1.702 m)    General appearance: alert, cooperative and appears stated age Head: Normocephalic, without obvious abnormality, atraumatic Neck: no adenopathy, supple, symmetrical, trachea midline and thyroid normal to inspection and palpation Lungs: clear to auscultation bilaterally Breasts: normal appearance, no masses or tenderness, No nipple retraction or dimpling, No nipple discharge or bleeding, No axillary or supraclavicular adenopathy Heart: regular rate and rhythm Abdomen: soft, non-tender; no masses,  no organomegaly Extremities: extremities normal, atraumatic, no cyanosis or edema Skin: Skin color, texture, turgor normal. No rashes or lesions Lymph nodes: Cervical, supraclavicular, and axillary  nodes normal. No abnormal inguinal nodes palpated Neurologic: Grossly normal   Pelvic: External genitalia:  no lesions              Urethra:  normal appearing urethra with no masses, tenderness or lesions              Bartholin's and Skene's: normal                 Vagina: normal appearing vagina with normal color and discharge, no lesions              Cervix: normal,non tender, no lesions              Pap taken: Yes.   Bimanual Exam:  Uterus:  normal size, contour, position, consistency, mobility, non-tender and mid position              Adnexa: normal adnexa and no mass, fullness, tenderness               Rectovaginal: Confirms               Anus:  normal sphincter tone, no lesions  Chaperone present: Yes  A:  Well Woman with normal exam  Contraception BTL  Perimenopausal with amenorrhea  Type 2 Diabetes stable with PCP management  Hearing loss bilateral progressive  P:   Reviewed health and wellness pertinent to exam  Discussed etiology of perimenopause and bleeding expectations. Given menses calendar with normal and abnormal parameters. Discussed labs to evaluate amenorrhea. Patient agreeable. Discussed Provera challenge in detail if indicated, patient agreeable and understands instructions if given.  Lab:FSH,TSH,Prolactin  Continue MD follow up as indicated  Encouraged to follow up with cochlear implant information  Pap smear taken today with HPV reflex   counseled on breast self exam, mammography screening, menopause, adequate intake of calcium and vitamin D, diet and exercise  return annually or prn  An After Visit Summary was printed and given to the patient.

## 2015-02-22 LAB — PROLACTIN: Prolactin: 13.8 ng/mL

## 2015-02-22 LAB — FOLLICLE STIMULATING HORMONE: FSH: 8.5 m[IU]/mL

## 2015-02-22 LAB — TSH: TSH: 0.853 u[IU]/mL (ref 0.350–4.500)

## 2015-02-24 LAB — IPS PAP TEST WITH REFLEX TO HPV

## 2015-02-24 MED ORDER — MEDROXYPROGESTERONE ACETATE 10 MG PO TABS: 10.0000 mg | ORAL_TABLET | Freq: Every day | ORAL | Status: DC

## 2015-02-25 NOTE — Progress Notes (Signed)
Reviewed personally.  M. Suzanne Avry Roedl, MD.  

## 2015-02-25 NOTE — Addendum Note (Signed)
Addended by: Regina Eck on: 02/25/2015 02:38 PM   Modules accepted: Level of Service, SmartSet

## 2015-03-04 ENCOUNTER — Telehealth: Payer: Self-pay | Admitting: Certified Nurse Midwife

## 2015-03-04 NOTE — Telephone Encounter (Signed)
Patient is calling to give an update. She has started her cycle yesterday and wants to know if she needs to continue the rest of the pills.

## 2015-03-04 NOTE — Telephone Encounter (Signed)
Called patient work number per request. Left detailed message via voicemail, okay per designated party release form.  Advised patient via message that since has started cycle can DC provera. Advised patient to keep a calendar of menses and to call back if does not have a cycle in 3 months.  Patient is hearing impaired, requests detailed message to work number.  Routing to provider for final review. Patient agreeable to disposition. Will close encounter

## 2015-04-20 ENCOUNTER — Other Ambulatory Visit: Payer: Self-pay | Admitting: Certified Nurse Midwife

## 2015-04-21 NOTE — Telephone Encounter (Signed)
Medication refill request: Mycolog II Cream  Last AEX:  02/21/15 DL Next AEX: 02/27/16 DL Last MMG (if hormonal medication request): 06/08/13 BIRADS1:Neg Refill authorized: 12/03/14 #30g/ 0 R.   Today please advise.  Routed to [facility]

## 2015-04-29 ENCOUNTER — Other Ambulatory Visit: Payer: Self-pay | Admitting: Nurse Practitioner

## 2015-04-29 NOTE — Telephone Encounter (Signed)
04/22/15 #30 gm/0 rfs was sent to [facility].

## 2015-06-12 ENCOUNTER — Other Ambulatory Visit (HOSPITAL_COMMUNITY): Payer: Self-pay | Admitting: Internal Medicine

## 2015-06-12 DIAGNOSIS — Z1231 Encounter for screening mammogram for malignant neoplasm of breast: Secondary | ICD-10-CM

## 2015-06-19 ENCOUNTER — Ambulatory Visit (HOSPITAL_COMMUNITY)
Admission: RE | Admit: 2015-06-19 | Discharge: 2015-06-19 | Disposition: A | Discharge status: Home / Self Care | Payer: BC Managed Care – PPO | Source: Ambulatory Visit | Attending: Internal Medicine | Admitting: Internal Medicine

## 2015-06-19 DIAGNOSIS — Z1231 Encounter for screening mammogram for malignant neoplasm of breast: Secondary | ICD-10-CM | POA: Diagnosis present

## 2015-06-24 ENCOUNTER — Other Ambulatory Visit: Payer: Self-pay | Admitting: Internal Medicine

## 2015-06-24 DIAGNOSIS — R928 Other abnormal and inconclusive findings on diagnostic imaging of breast: Secondary | ICD-10-CM

## 2015-07-15 ENCOUNTER — Ambulatory Visit (HOSPITAL_COMMUNITY)
Admission: RE | Admit: 2015-07-15 | Discharge: 2015-07-15 | Disposition: A | Discharge status: Home / Self Care | Payer: BC Managed Care – PPO | Source: Ambulatory Visit | Attending: Internal Medicine | Admitting: Internal Medicine

## 2015-07-15 DIAGNOSIS — R928 Other abnormal and inconclusive findings on diagnostic imaging of breast: Secondary | ICD-10-CM

## 2015-09-10 ENCOUNTER — Ambulatory Visit (INDEPENDENT_AMBULATORY_CARE_PROVIDER_SITE_OTHER): Payer: BC Managed Care – PPO | Admitting: Internal Medicine

## 2015-09-10 ENCOUNTER — Encounter (INDEPENDENT_AMBULATORY_CARE_PROVIDER_SITE_OTHER): Payer: Self-pay | Admitting: Internal Medicine

## 2015-09-10 VITALS — BP 102/58 | HR 72 | Temp 98.0°F | Ht 67.0 in | Wt 217.1 lb

## 2015-09-10 DIAGNOSIS — M546 Pain in thoracic spine: Secondary | ICD-10-CM | POA: Diagnosis not present

## 2015-09-10 NOTE — Patient Instructions (Signed)
HIDA scan.

## 2015-09-10 NOTE — Progress Notes (Signed)
Subjective:    Patient ID: Brandi Farley, female    DOB: 1961-08-12, 54 y.o.   MRN: [id number]  HPI Patient presents  Today with c/o rt upper back pain. Pain x 2 yrs. ? MS pain.  She says if eats blue berries she says she sees the blue berries will be in her stool. She denies any pain after eating. She is off the Protonix after losing 17 pounds. Her appetite is good.  She also c/o itching to her palms of hand and feet.  She denies any abdominal pain.  She takes Vitamin D once a week.  She has been dieting. She has lost 17 pounds wihich was intentional.  She is worried about her gallbladder.  She tells me her lat colonoscopy was in 2011 and was normal per patient. (Dr. Laural Golden)     08/15/2015: H andH a 13.5 and 40.6, MCV 86.2, Total protein 5.9, Calcium 8,4, total bili 0.5, AST 16, ALT 16.   02/06/2014 EGD  Indications: Patient is 54 year old Caucasian female who presents with recurrent epigastric pain. 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 Ultrasound 18 months ago was negative for cholelithiasis.  Impression: Small sliding hiatal hernia. Erosive antral gastritis but no evidence of peptic ulcer disease. H. Pylori was negative.    Review of Systems Past Medical History  Diagnosis Date  . Hearing deficit   . Fibroid     Breast  . Hormone disorder   . Arthritis   . Seasonal allergies   . Acid reflux     Past Surgical History  Procedure Laterality Date  . Appendectomy    . Tubal ligation  1991  . Esophagogastroduodenoscopy N/A 02/06/2014    Procedure: ESOPHAGOGASTRODUODENOSCOPY (EGD);  Surgeon: Rogene Houston, MD;  Location: [facility];  Service: Endoscopy;  Laterality: N/A;  1200    Allergies  Allergen Reactions  . Bactrim [Sulfamethoxazole-Trimethoprim] Shortness Of Breath and Other (See Comments)    Cramping  . Doryx  [Doxycycline] Shortness Of Breath  . Keflex [Cephalexin] Shortness Of Breath    Current Outpatient Prescriptions on File Prior to Visit  Medication Sig Dispense Refill  . Aspirin-Acetaminophen-Caffeine (EXCEDRIN MIGRAINE PO) Take by mouth.    . cetirizine (ZYRTEC) 10 MG tablet Take 10 mg by mouth daily as needed for allergies.    . chlorpheniramine (CHLOR-TRIMETON) 4 MG tablet Take 4 mg by mouth as needed.     . diclofenac (VOLTAREN) 75 MG EC tablet   2  . guaiFENesin (MUCINEX) 600 MG 12 hr tablet Take by mouth 2 (two) times daily as needed.    . hydrochlorothiazide (HYDRODIURIL) 25 MG tablet Take 25 mg by mouth daily.    Marland Kitchen ibuprofen (ADVIL,MOTRIN) 200 MG tablet Take 200 mg by mouth every 6 (six) hours as needed.    . medroxyPROGESTERone (PROVERA) 10 MG tablet Take 1 tablet (10 mg total) by mouth daily. 10 tablet 0  . metaxalone (SKELAXIN) 800 MG tablet   0  . mupirocin ointment (BACTROBAN) 2 % Apply topically as needed.    . nystatin (MYCOSTATIN/NYSTOP) 100000 UNIT/GM POWD     . nystatin-triamcinolone (MYCOLOG II) cream APPLY TO THE AFFECTED AREA TWICE DAILY 30 g 0  . Vitamin D, Ergocalciferol, (DRISDOL) 50000 UNITS CAPS capsule Take 50,000 Units by mouth every 7 (seven) days.    . pantoprazole (PROTONIX) 40 MG tablet Take 40 mg by mouth daily.     No current facility-administered medications on file prior to visit.  Objective:   Physical Exam Blood pressure 102/58, pulse 72, temperature 98 F (36.7 C), height 5\' 7"  (1.702 m), weight 217 lb 1.6 oz (98.476 kg).  Alert and oriented. Skin warm and dry. Oral mucosa is moist.   . Sclera anicteric, conjunctivae is pink. Thyroid not enlarged. No cervical lymphadenopathy. Lungs clear. Heart regular rate and rhythm.  Abdomen is soft. Bowel sounds are positive. No hepatomegaly. No abdominal masses felt. No tenderness.  No edema to lower extremities.         Assessment & Plan:  Rt back pain. None today. Patient is concerned this is  her GB. Probably  MS in nature. Will however get a HIDA scan to rule GB disease out.

## 2015-09-11 ENCOUNTER — Encounter (INDEPENDENT_AMBULATORY_CARE_PROVIDER_SITE_OTHER): Payer: Self-pay

## 2015-09-15 ENCOUNTER — Other Ambulatory Visit: Payer: Self-pay | Admitting: Nurse Practitioner

## 2015-09-15 ENCOUNTER — Encounter (INDEPENDENT_AMBULATORY_CARE_PROVIDER_SITE_OTHER): Payer: Self-pay

## 2015-09-15 ENCOUNTER — Encounter (INDEPENDENT_AMBULATORY_CARE_PROVIDER_SITE_OTHER): Payer: Self-pay | Admitting: Pharmacist

## 2015-09-15 NOTE — Telephone Encounter (Signed)
Medication refill request: Mycolog Last AEX:  02-21-15  Next AEX: 02-27-16  Last MMG (if hormonal medication request): 07-15-15 WNL Refill authorized: please advise

## 2015-09-22 ENCOUNTER — Encounter (HOSPITAL_COMMUNITY): Payer: BC Managed Care – PPO

## 2015-09-24 ENCOUNTER — Encounter (HOSPITAL_COMMUNITY)
Admission: RE | Admit: 2015-09-24 | Discharge: 2015-09-24 | Disposition: A | Discharge status: Home / Self Care | Payer: BC Managed Care – PPO | Source: Ambulatory Visit | Attending: Internal Medicine | Admitting: Internal Medicine

## 2015-09-24 ENCOUNTER — Encounter (HOSPITAL_COMMUNITY): Payer: Self-pay

## 2015-09-24 DIAGNOSIS — M546 Pain in thoracic spine: Secondary | ICD-10-CM

## 2015-09-24 MED ORDER — SODIUM CHLORIDE 0.9 % IJ SOLN
INTRAMUSCULAR | Status: AC
  Filled 2015-09-24: qty 12

## 2015-09-24 MED ORDER — TECHNETIUM TC 99M MEBROFENIN IV KIT
5.0000 | PACK | Freq: Once | INTRAVENOUS | Status: DC | PRN
  Administered 2015-09-24: 5 via INTRAVENOUS
  Filled 2015-09-24: qty 6

## 2015-09-24 MED ORDER — STERILE WATER FOR INJECTION IJ SOLN
INTRAMUSCULAR | Status: AC
  Administered 2015-09-24: 1.97 mL via INTRAVENOUS
  Filled 2015-09-24: qty 10

## 2015-09-24 MED ORDER — SINCALIDE 5 MCG IJ SOLR
INTRAMUSCULAR | Status: AC
  Administered 2015-09-24: 1.97 ug via INTRAVENOUS
  Filled 2015-09-24: qty 5

## 2015-11-24 DIAGNOSIS — H903 Sensorineural hearing loss, bilateral: Secondary | ICD-10-CM | POA: Insufficient documentation

## 2016-01-05 ENCOUNTER — Other Ambulatory Visit: Payer: Self-pay | Admitting: Certified Nurse Midwife

## 2016-01-06 NOTE — Telephone Encounter (Signed)
Medication refill request: Mycolog cream Last AEX:  02-21-15 Next AEX: 02-27-16 Last MMG (if hormonal medication request): 07-15-15 U/S WNL  Refill authorized: please advise

## 2016-02-27 ENCOUNTER — Ambulatory Visit (INDEPENDENT_AMBULATORY_CARE_PROVIDER_SITE_OTHER): Payer: BC Managed Care – PPO | Admitting: Certified Nurse Midwife

## 2016-02-27 ENCOUNTER — Encounter: Payer: Self-pay | Admitting: Certified Nurse Midwife

## 2016-02-27 VITALS — BP 120/62 | HR 70 | Resp 16 | Ht 67.75 in | Wt 220.0 lb

## 2016-02-27 DIAGNOSIS — Z0000 Encounter for general adult medical examination without abnormal findings: Secondary | ICD-10-CM | POA: Diagnosis not present

## 2016-02-27 DIAGNOSIS — Z01419 Encounter for gynecological examination (general) (routine) without abnormal findings: Secondary | ICD-10-CM | POA: Diagnosis not present

## 2016-02-27 LAB — POCT URINALYSIS DIPSTICK
Bilirubin, UA: NEGATIVE
Glucose, UA: NEGATIVE
Ketones, UA: NEGATIVE
Leukocytes, UA: NEGATIVE
NITRITE UA: NEGATIVE
PH UA: 5
PROTEIN UA: NEGATIVE
RBC UA: NEGATIVE
UROBILINOGEN UA: NEGATIVE

## 2016-02-27 NOTE — Progress Notes (Signed)
55 y.o. G33P1001 Married  Caucasian Fe here for annual exam. Periods every other month for the most part of 2016, light to moderate bleeding only, no issues.  Continues to keep menses calendar and will advise if she misses 3 months as before. Sees PCP every 6 months for aex/labs and medication management for hypertension and Diabetes monitoring. Had cochlear implant last year after our discussion regarding usage. So happy now can actually call her spouse on the phone! 14 pound weight loss and continues to work on. No health issues today.  Patient's last menstrual period was 02/17/2016.          Sexually active: No.  The current method of family planning is tubal ligation.    Exercising: Yes.    walking, weights Smoker:  no  Health Maintenance: Pap:  02-21-15 neg MMG: 06-20-15 bilateral & f/u U/s rt breast 7/16category b density birads 2:neg Colonoscopy:  2011 neg per patient BMD:   Neg previously per patient TDaP:  2015 Shingles: no Pneumonia: no Hep C and HIV: HIV done in the 1990's Labs: poct urine-neg Self breast exam: done monthly   reports that she has quit smoking. She does not have any smokeless tobacco history on file. She reports that she does not drink alcohol or use illicit drugs.  Past Medical History  Diagnosis Date  . Hearing deficit   . Fibroid     Breast  . Hormone disorder   . Arthritis   . Seasonal allergies   . Acid reflux   . Asthma     Past Surgical History  Procedure Laterality Date  . Appendectomy    . Tubal ligation  1991  . Esophagogastroduodenoscopy N/A 02/06/2014    Procedure: ESOPHAGOGASTRODUODENOSCOPY (EGD);  Surgeon: Rogene Houston, MD;  Location: [facility];  Service: Endoscopy;  Laterality: N/A;  1200  . Cochlear implant      Current Outpatient Prescriptions  Medication Sig Dispense Refill  . Aspirin-Acetaminophen-Caffeine (EXCEDRIN MIGRAINE PO) Take by mouth.    . cetirizine (ZYRTEC) 10 MG tablet Take 10 mg by mouth daily as needed for  allergies.    . chlorpheniramine (CHLOR-TRIMETON) 4 MG tablet Take 4 mg by mouth as needed.     Marland Kitchen guaiFENesin (MUCINEX) 600 MG 12 hr tablet Take by mouth 2 (two) times daily as needed.    . hydrochlorothiazide (HYDRODIURIL) 25 MG tablet Take 25 mg by mouth daily.    Marland Kitchen ibuprofen (ADVIL,MOTRIN) 200 MG tablet Take 200 mg by mouth every 6 (six) hours as needed.    . mupirocin ointment (BACTROBAN) 2 % Apply topically as needed.    . nystatin (MYCOSTATIN/NYSTOP) 100000 UNIT/GM POWD     . nystatin-triamcinolone (MYCOLOG II) cream APPLY TO THE AFFECTED AREA TWICE DAILY 60 g 0  . Vitamin D, Ergocalciferol, (DRISDOL) 50000 UNITS CAPS capsule Take 50,000 Units by mouth every 7 (seven) days.     No current facility-administered medications for this visit.    Family History  Problem Relation Age of Onset  . Adopted: Yes  . Breast cancer Mother     Late 30's-40's, brain tumor  . Lupus Maternal Aunt     ROS:  Pertinent items are noted in HPI.  Otherwise, a comprehensive ROS was negative.  Exam:   BP 120/62 mmHg  Pulse 70  Resp 16  Ht 5' 7.75" (1.721 m)  Wt 220 lb (99.791 kg)  BMI 33.69 kg/m2  LMP 02/17/2016 Height: 5' 7.75" (172.1 cm) Ht Readings from Last 3 Encounters:  02/27/16 5' 7.75" (1.721 m)  09/10/15 5\' 7"  (1.702 m)  02/21/15 5' 7.75" (1.721 m)    General appearance: alert, cooperative and appears stated age Head: Normocephalic, without obvious abnormality, atraumatic Neck: no adenopathy, supple, symmetrical, trachea midline and thyroid normal to inspection and palpation Lungs: clear to auscultation bilaterally Breasts: normal appearance, no masses or tenderness, No nipple retraction or dimpling, No nipple discharge or bleeding, No axillary or supraclavicular adenopathy Heart: regular rate and rhythm Abdomen: soft, non-tender; no masses,  no organomegaly Extremities: extremities normal, atraumatic, no cyanosis or edema Skin: Skin color, texture, turgor normal. No rashes or  lesions Lymph nodes: Cervical, supraclavicular, and axillary nodes normal. No abnormal inguinal nodes palpated Neurologic: Grossly normal   Pelvic: External genitalia:  no lesions              Urethra:  normal appearing urethra with no masses, tenderness or lesions              Bartholin's and Skene's: normal                 Vagina: normal appearing vagina with normal color and discharge, no lesions              Cervix: normal,non tender, no lesions              Pap taken: No. Bimanual Exam:  Uterus:  normal size, contour, position, consistency, mobility, non-tender              Adnexa: normal adnexa and no mass, fullness, tenderness               Rectovaginal: Confirms               Anus:  normal sphincter tone, no lesions  Chaperone present: yes  A:  Well Woman with normal exam  Contraception BTL  Perimenopausal with menstrual cycle changes keeping menses calendar  Hypertension management with PCP  Recent cochlear implant to help with Meniere"s disease with hearing restoration in progress.   P:   Reviewed health and wellness pertinent to exam  Discussed need to call if no misses period for 3 months, currently every month. Questions addressed regarding menopause.  Continue follow up with PCP as indicated  Pap smear as above  Not taken  Congratulated on weight progress and courage to have cochlear implant.  counseled on breast self exam, mammography screening, diet and exercise, vitamin D importance . return annually or prn  An After Visit Summary was printed and given to the patient.

## 2016-02-27 NOTE — Patient Instructions (Signed)

## 2016-03-03 NOTE — Progress Notes (Signed)
Encounter reviewed [name], MD   

## 2016-06-19 ENCOUNTER — Other Ambulatory Visit: Payer: Self-pay | Admitting: Certified Nurse Midwife

## 2016-06-21 NOTE — Telephone Encounter (Signed)
Medication refill request: medroxyPROGESTERone 10mg  Last AEX:  02/27/16 DL Next AEX: 03/04/17 Last MMG (if hormonal medication request): 06-20-15 bilateral & f/u U/s rt breast 7/16category b density birads 2:neg Refill authorized: Med has been discontinued 02/27/16

## 2016-08-24 ENCOUNTER — Other Ambulatory Visit: Payer: Self-pay | Admitting: Certified Nurse Midwife

## 2016-08-24 NOTE — Telephone Encounter (Signed)
Medication refill request: Nystatin  Last AEX:  02-27-16  Next AEX: 03-04-17 Last MMG (if hormonal medication request): 07-15-15 WNL  Refill authorized: please advise   Sending to Vivian since DL is out of the office

## 2016-11-01 ENCOUNTER — Telehealth: Payer: Self-pay | Admitting: Certified Nurse Midwife

## 2016-11-01 NOTE — Telephone Encounter (Signed)
Patient was told to call if she did not start her cycle for 3 months. Patient said "last time a prescription was called to my pharmacy". Patient said you may leave a message on her voicemail.

## 2016-11-01 NOTE — Telephone Encounter (Signed)
Spoke with patient. Patient states LMP July 11, 2016 -reports as light and last for approximately 5 days. Patient states was advised to call if no cycle for 3 months. Last AEX 02/27/16. Recommended OV for further evaluation. Patient scheduled for 11/02/16 at 11:00am with Melvia Heaps, CNM. Patient agreeable to date and time.    Routing to provider for final review. Patient is agreeable to disposition. Will close encounter.

## 2016-11-02 ENCOUNTER — Encounter: Payer: Self-pay | Admitting: Certified Nurse Midwife

## 2016-11-02 ENCOUNTER — Ambulatory Visit (INDEPENDENT_AMBULATORY_CARE_PROVIDER_SITE_OTHER): Payer: BC Managed Care – PPO | Admitting: Certified Nurse Midwife

## 2016-11-02 VITALS — BP 102/70 | HR 72 | Resp 16 | Ht 67.25 in | Wt 219.0 lb

## 2016-11-02 DIAGNOSIS — N912 Amenorrhea, unspecified: Secondary | ICD-10-CM

## 2016-11-02 DIAGNOSIS — N951 Menopausal and female climacteric states: Secondary | ICD-10-CM | POA: Diagnosis not present

## 2016-11-02 LAB — TSH: TSH: 1.32 mIU/L

## 2016-11-02 NOTE — Patient Instructions (Signed)

## 2016-11-02 NOTE — Progress Notes (Signed)
55 y.o. Married Caucasian female G1P1001 here with complaint of amenorrhea. LNMP 07/11/16 with 5 days duration. Has felt like she was going to start menses, but none seen. Has had increase fluid retention and took diuretic with good results. Contraception Tubal. Occasional? Hot flashes, no night sweats or vaginal dryness. Had episode of amenorrhea in 2016 and given Provera with withdrawal bleeding occurrence. No other health issues today.   O: Healthy WD,WN female Affect: normal, orientation x 3 Skin:warm and dry Abdomen:soft , non tender Pelvic exam:EXTERNAL GENITALIA: normal appearing vulva with no masses, tenderness or lesions VAGINA: no abnormal discharge or lesions and normal appearance CERVIX: no lesions or cervical motion tenderness and normal appearance UTERUS: non tender, no masses , normal size ADNEXA: no masses palpable and nontender  A:Perimenopausal with amenorrhea Contraception Tubal ligation Normal pelvic exam    P: Discussed findings of normal pelvic exam. Discussed perimenopause and menopause and etiology. Discussed expectations with bleeding profiles and other symptoms. Discussed need to evaluate with labs and agreeable with this. Discussed possible need for Provera challenge and will advise once labs in. Questions addressed. Labs: FSH,TSH,Prolactin  Rv prn   Labs   Instructions given regarding:  RV

## 2016-11-03 ENCOUNTER — Other Ambulatory Visit: Payer: Self-pay | Admitting: Certified Nurse Midwife

## 2016-11-03 DIAGNOSIS — N912 Amenorrhea, unspecified: Secondary | ICD-10-CM

## 2016-11-03 LAB — PROLACTIN: Prolactin: 9.1 ng/mL

## 2016-11-03 LAB — FOLLICLE STIMULATING HORMONE: FSH: 45.4 m[IU]/mL

## 2016-11-03 MED ORDER — MEDROXYPROGESTERONE ACETATE 10 MG PO TABS: 10.0000 mg | ORAL_TABLET | Freq: Every day | ORAL | 0 refills | Status: DC

## 2016-11-09 NOTE — Progress Notes (Signed)
Encounter reviewed [name], MD   

## 2016-12-20 HISTORY — PX: KNEE SURGERY: SHX244

## 2017-02-14 ENCOUNTER — Other Ambulatory Visit: Payer: Self-pay | Admitting: Nurse Practitioner

## 2017-02-14 NOTE — Telephone Encounter (Signed)
Medication refill request: Nystatin-Triamcinolone Last AEX:  02/27/16 DL Next AEX: 03/02/17 DL Last MMG (if hormonal medication request): 06/19/15 [id number], Michaelene Song; 07/14/16 Dx & U/S R Breast Christean Grief Refill authorized: 08/24/16 #60g 0R. Please advise. Thank you.

## 2017-03-02 ENCOUNTER — Ambulatory Visit (INDEPENDENT_AMBULATORY_CARE_PROVIDER_SITE_OTHER): Payer: BC Managed Care – PPO | Admitting: Certified Nurse Midwife

## 2017-03-02 ENCOUNTER — Encounter: Payer: Self-pay | Admitting: Certified Nurse Midwife

## 2017-03-02 VITALS — BP 112/58 | HR 64 | Resp 16 | Ht 67.25 in | Wt 218.0 lb

## 2017-03-02 DIAGNOSIS — Z124 Encounter for screening for malignant neoplasm of cervix: Secondary | ICD-10-CM | POA: Diagnosis not present

## 2017-03-02 DIAGNOSIS — N898 Other specified noninflammatory disorders of vagina: Secondary | ICD-10-CM

## 2017-03-02 DIAGNOSIS — L298 Other pruritus: Secondary | ICD-10-CM

## 2017-03-02 DIAGNOSIS — Z01419 Encounter for gynecological examination (general) (routine) without abnormal findings: Secondary | ICD-10-CM | POA: Diagnosis not present

## 2017-03-02 DIAGNOSIS — N951 Menopausal and female climacteric states: Secondary | ICD-10-CM

## 2017-03-02 NOTE — Patient Instructions (Signed)

## 2017-03-02 NOTE — Progress Notes (Signed)
56 y.o. G34P1001 Married  Caucasian Fe here for annual exam. Periods changing, last period 01/09/17, light. Has been working on weight loss down 15 pounds. Sees PCP for  Hypertension and diabetes management, aex/labs. No change in medications. 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 Busy with family. Complaining of vaginal itching on and off with sweating only. Using Nystatin cream with good results. No other health issues today.  Patient's last menstrual period was 01/09/2017.          Sexually active: No.  The current method of family planning is tubal ligation.    Exercising: Yes.    walking Smoker:  no  Health Maintenance: Pap:  02-21-15 neg MMG:  7/16 & rt breast u/s category b density birads 2:neg Colonoscopy: 2011 neg f/u 68yrs BMD:  Had done long time ago TDaP:  2015 Shingles: no Pneumonia: no Hep C and HIV: HIV done in the 90's Labs: none Self breast exam: done daily   reports that she has quit smoking. She has never used smokeless tobacco. She reports that she does not drink alcohol or use drugs.  Past Medical History:  Diagnosis Date  . Acid reflux   . Arthritis   . Asthma   . Fibroid    Breast  . Hearing deficit   . Hormone disorder   . Seasonal allergies     Past Surgical History:  Procedure Laterality Date  . APPENDECTOMY    . COCHLEAR IMPLANT    . ESOPHAGOGASTRODUODENOSCOPY N/A 02/06/2014   Procedure: ESOPHAGOGASTRODUODENOSCOPY (EGD);  Surgeon: Rogene Houston, MD;  Location: [facility];  Service: Endoscopy;  Laterality: N/A;  1200  . TUBAL LIGATION  1991    Current Outpatient Prescriptions  Medication Sig Dispense Refill  . Aspirin-Acetaminophen-Caffeine (EXCEDRIN MIGRAINE PO) Take by mouth.    . cetirizine (ZYRTEC) 10 MG tablet Take 10 mg by mouth daily as needed for allergies.    Marland Kitchen diclofenac (VOLTAREN) 75 MG EC tablet Take 1 tablet by mouth as needed.  0  . guaiFENesin (MUCINEX) 600 MG 12 hr  tablet Take by mouth 2 (two) times daily as needed.    . hydrochlorothiazide (HYDRODIURIL) 25 MG tablet Take 25 mg by mouth daily.    Marland Kitchen ibuprofen (ADVIL,MOTRIN) 200 MG tablet Take 200 mg by mouth every 6 (six) hours as needed.    . mupirocin ointment (BACTROBAN) 2 % Apply topically as needed.    . nystatin-triamcinolone (MYCOLOG II) cream APPLY EXTERNALLY TO THE AFFECTED AREA TWICE DAILY 60 g 0   No current facility-administered medications for this visit.     Family History  Problem Relation Age of Onset  . Adopted: Yes  . Breast cancer Mother     Late 30's-40's, brain tumor  . Lupus Maternal Aunt     ROS:  Pertinent items are noted in HPI.  Otherwise, a comprehensive ROS was negative.  Exam:   BP (!) 112/58   Pulse 64   Resp 16   Ht 5' 7.25" (1.708 m)   Wt 218 lb (98.9 kg)   LMP 01/09/2017   BMI 33.89 kg/m  Height: 5' 7.25" (170.8 cm) Ht Readings from Last 3 Encounters:  03/02/17 5' 7.25" (1.708 m)  11/02/16 5' 7.25" (1.708 m)  02/27/16 5' 7.75" (1.721 m)    General appearance: alert, cooperative and appears stated age Head: Normocephalic, without obvious abnormality, atraumatic Neck: no adenopathy, supple, symmetrical, trachea midline and thyroid normal to inspection and  palpation Lungs: clear to auscultation bilaterally Breasts: normal appearance, no masses or tenderness, No nipple retraction or dimpling, No nipple discharge or bleeding, No axillary or supraclavicular adenopathy, sebaceous cyst still present on right breast at lower edge, no change Heart: regular rate and rhythm Abdomen: soft, non-tender; no masses,  no organomegaly Extremities: extremities normal, atraumatic, no cyanosis or edema Skin: Skin color, texture, turgor normal. No rashes or lesions Lymph nodes: Cervical, supraclavicular, and axillary nodes normal. No abnormal inguinal nodes palpated Neurologic: Grossly normal   Pelvic: External genitalia:  no lesions              Urethra:  normal  appearing urethra with no masses, tenderness or lesions              Bartholin's and Skene's: normal                 Vagina: normal appearing vagina with normal color and discharge, no lesions              Cervix: multiparous appearance, no bleeding following Pap, no cervical motion tenderness and no lesions              Pap taken: Yes.   Bimanual Exam:  Uterus:  normal size, contour, position, consistency, mobility, non-tender and anteverted              Adnexa: normal adnexa and no mass, fullness, tenderness               Rectovaginal: Confirms               Anus:  normal sphincter tone, no lesions  Chaperone present: yes  A:  Well Woman with normal exam  Contraception absentinence  Perimenopausal with cycle change  Hypertension /diabetes management with PCP  R/O vaginal infection due to vaginal itching   P:   Reviewed health and wellness pertinent to exam  Discussed importance of monitoring periods as before, and if no period in 3 months needs to advise. Previous Provera challenge with no withdrawal bleeding. Patient aware and will advise.  Continue MD follow up as indicated  Lab Affirm will treat if indicated  Pap smear as above   counseled on breast self exam, mammography screening, menopause, adequate intake of calcium and vitamin D, diet and exercise  return annually or prn  An After Visit Summary was printed and given to the patient.

## 2017-03-03 ENCOUNTER — Encounter: Payer: Self-pay | Admitting: Certified Nurse Midwife

## 2017-03-03 LAB — WET PREP BY MOLECULAR PROBE
Candida species: NOT DETECTED
GARDNERELLA VAGINALIS: NOT DETECTED
Trichomonas vaginosis: NOT DETECTED

## 2017-03-04 ENCOUNTER — Ambulatory Visit: Payer: BC Managed Care – PPO | Admitting: Certified Nurse Midwife

## 2017-03-04 LAB — IPS PAP TEST WITH HPV

## 2017-03-07 MED ORDER — METRONIDAZOLE 0.75 % VA GEL: 1.0000 | Freq: Every day | VAGINAL | 0 refills | Status: AC

## 2017-03-07 NOTE — Progress Notes (Signed)
Encounter reviewed Kiing Deakin, MD   

## 2017-03-07 NOTE — Addendum Note (Signed)
Addended by: Terence Lux A on: 03/07/2017 11:20 AM   Modules accepted: Orders

## 2017-07-04 ENCOUNTER — Other Ambulatory Visit: Payer: Self-pay | Admitting: Orthopedic Surgery

## 2017-07-04 DIAGNOSIS — M238X1 Other internal derangements of right knee: Secondary | ICD-10-CM

## 2017-07-09 ENCOUNTER — Ambulatory Visit
Admission: RE | Admit: 2017-07-09 | Discharge: 2017-07-09 | Disposition: A | Discharge status: Home / Self Care | Payer: BC Managed Care – PPO | Source: Ambulatory Visit | Attending: Orthopedic Surgery | Admitting: Orthopedic Surgery

## 2017-07-09 DIAGNOSIS — M238X1 Other internal derangements of right knee: Secondary | ICD-10-CM

## 2018-03-03 ENCOUNTER — Ambulatory Visit: Payer: BC Managed Care – PPO | Admitting: Certified Nurse Midwife

## 2018-03-13 ENCOUNTER — Encounter: Payer: Self-pay | Admitting: Allergy & Immunology

## 2018-03-13 ENCOUNTER — Ambulatory Visit: Payer: BC Managed Care – PPO | Admitting: Allergy & Immunology

## 2018-03-13 VITALS — BP 126/74 | HR 92 | Temp 97.7°F | Resp 16 | Ht 67.0 in | Wt 232.4 lb

## 2018-03-13 DIAGNOSIS — J3089 Other allergic rhinitis: Secondary | ICD-10-CM

## 2018-03-13 DIAGNOSIS — L508 Other urticaria: Secondary | ICD-10-CM

## 2018-03-13 DIAGNOSIS — J302 Other seasonal allergic rhinitis: Secondary | ICD-10-CM

## 2018-03-13 MED ORDER — MONTELUKAST SODIUM 10 MG PO TABS
10.0000 mg | ORAL_TABLET | Freq: Every day | ORAL | 5 refills | Status: DC
Start: 2018-03-13 — End: 2018-08-15

## 2018-03-13 MED ORDER — RANITIDINE HCL 150 MG PO TABS: 150.0000 mg | ORAL_TABLET | Freq: Two times a day (BID) | ORAL | 5 refills | Status: DC

## 2018-03-13 NOTE — Patient Instructions (Addendum)
1. Chronic urticaria - Your history does not have any "red flags" such as fevers, joint pains, or permanent skin changes that would be concerning for a more serious cause of hives.  - Testing was negative to the most common food (peanut, tree nuts, soy, fish mix, shellfish mix, wheat, milk, egg, sesame). - We will get some labs to rule out serious causes of hives: complete blood count, tryptase level, thyroid antibodies, CMP, and ANA. - Chronic hives are often times a self limited process and will "burn themselves out" over 6-12 months, although this is not always the case.  - In the meantime, start suppressive dosing of antihistamines:   - Morning: Xyzal (levocetirizine) 5mg  (one tablet)  - Evening: Xyzal (levocetirizine) 5mg  (one tablet) + Singulair (montelukast) 10mg   - If the above is not working, try adding: Zantac (ranitidine) 300mg  (two tablets) - You can change this dosing at home, decreasing the dose as needed or increasing the dosing as needed.  - If you are not tolerating the medications or are tired of taking them every day, we can start treatment with a monthly injectable medication called Xolair.   2. Perennial and seasonal allergic rhinitis (grasses, weeds, trees, cats, dogs, indoor molds, outdoor molds) - Testing today showed: trees, weeds, grasses and outdoor molds - Avoidance measures provided. - Continue with: Xyzal (levocetirizine) 5mg  twice daily - Start taking: Flonase (fluticasone) one spray per nostril daily - You can use an extra dose of the antihistamine, if needed, for breakthrough symptoms.  - Consider nasal saline rinses 1-2 times daily to remove allergens from the nasal cavities as well as help with mucous clearance (this is especially helpful to do before the nasal sprays are given) - Consider allergy shots as a means of long-term control. - Allergy shots "re-train" and "reset" the immune system to ignore environmental allergens and decrease the resulting immune  response to those allergens (sneezing, itchy watery eyes, runny nose, nasal congestion, etc).    - Allergy shots improve symptoms in 75-85% of patients.  - We can discuss more at the next appointment if the medications are not working for you.  3. Return in about 3 months (around 06/13/2018).  Please inform us of any Emergency Department visits, hospitalizations, or changes in symptoms. Call us before going to the ED for breathing or allergy symptoms since we might be able to fit you in for a sick visit. Feel free to contact us anytime with any questions, problems, or concerns.  It was a pleasure to meet you today!  Websites that have reliable patient information: 1. American Academy of Asthma, Allergy, and Immunology: www.aaaai.org 2. Food Allergy Research and Education (FARE): foodallergy.org 3. Mothers of Asthmatics: http://www.asthmacommunitynetwork.org 4. American College of Allergy, Asthma, and Immunology: www.acaai.org   Reducing Pollen Exposure  The American Academy of Allergy, Asthma and Immunology suggests the following steps to reduce your exposure to pollen during allergy seasons.    1. Do not hang sheets or clothing out to dry; pollen may collect on these items. 2. Do not mow lawns or spend time around freshly cut grass; mowing stirs up pollen. 3. Keep windows closed at night.  Keep car windows closed while driving. 4. Minimize morning activities outdoors, a time when pollen counts are usually at their highest. 5. Stay indoors as much as possible when pollen counts or humidity is high and on windy days when pollen tends to remain in the air longer. 6. Use air conditioning when possible.  Many air conditioners  have filters that trap the pollen spores. 7. Use a HEPA room air filter to remove pollen form the indoor air you breathe.  Control of Mold Allergen   Mold and fungi can grow on a variety of surfaces provided certain temperature and moisture conditions exist.  Outdoor  molds grow on plants, decaying vegetation and soil.  The major outdoor mold, Alternaria and Cladosporium, are found in very high numbers during hot and dry conditions.  Generally, a late Summer - Fall peak is seen for common outdoor fungal spores.  Rain will temporarily lower outdoor mold spore count, but counts rise rapidly when the rainy period ends.  The most important indoor molds are Aspergillus and Penicillium.  Dark, humid and poorly ventilated basements are ideal sites for mold growth.  The next most common sites of mold growth are the bathroom and the kitchen.  Outdoor (Seasonal) Mold Control  Positive outdoor molds via skin testing: Alternaria, Bipolaris (Helminthsporium), Drechslera (Curvalaria) and Mucor  1. Use air conditioning and keep windows closed 2. Avoid exposure to decaying vegetation. 3. Avoid leaf raking. 4. Avoid grain handling. 5. Consider wearing a face mask if working in moldy areas.  6.   Indoor (Perennial) Mold Control   Positive indoor molds via skin testing: Aspergillus, Penicillium, Fusarium, Aureobasidium (Pullulara) and Rhizopus  1. Maintain humidity below 50%. 2. Clean washable surfaces with 5% bleach solution. 3. Remove sources e.g. contaminated carpets.     Control of Dog or Cat Allergen  Avoidance is the best way to manage a dog or cat allergy. If you have a dog or cat and are allergic to dog or cats, consider removing the dog or cat from the home. If you have a dog or cat but don't want to find it a new home, or if your family wants a pet even though someone in the household is allergic, here are some strategies that may help keep symptoms at bay:  1. Keep the pet out of your bedroom and restrict it to only a few rooms. Be advised that keeping the dog or cat in only one room will not limit the allergens to that room. 2. Don't pet, hug or kiss the dog or cat; if you do, wash your hands with soap and water. 3. High-efficiency particulate air (HEPA)  cleaners run continuously in a bedroom or living room can reduce allergen levels over time. 4. Regular use of a high-efficiency vacuum cleaner or a central vacuum can reduce allergen levels. 5. Giving your dog or cat a bath at least once a week can reduce airborne allergen.

## 2018-03-13 NOTE — Progress Notes (Signed)
NEW PATIENT  Date of Service/Encounter:  03/13/18  Referring provider: Celene Squibb, MD   Assessment:   Chronic idiopathic urticaria  Seasonal and perennial allergic rhinitis (grasses, weeds, trees, cats, dogs, indoor molds, outdoor molds)  Plan/Recommendations:   1. Chronic urticaria - Your history does not have any "red flags" such as fevers, joint pains, or permanent skin changes that would be concerning for a more serious cause of hives.  - Testing was negative to the most common food (peanut, tree nuts, soy, fish mix, shellfish mix, wheat, milk, egg, sesame). - We will get some labs to rule out serious causes of hives: complete blood count, tryptase level, thyroid antibodies, CMP, and ANA. - Chronic hives are often times a self limited process and will "burn themselves out" over 6-12 months, although this is not always the case.  - In the meantime, start suppressive dosing of antihistamines:   - Morning: Xyzal (levocetirizine) 5mg  (one tablet)  - Evening: Xyzal (levocetirizine) 5mg  (one tablet) + Singulair (montelukast) 10mg   - If the above is not working, try adding: Zantac (ranitidine) 300mg  (two tablets) - You can change this dosing at home, decreasing the dose as needed or increasing the dosing as needed.  - If you are not tolerating the medications or are tired of taking them every day, we can start treatment with a monthly injectable medication called Xolair.   2. Perennial and seasonal allergic rhinitis (grasses, weeds, trees, cats, dogs, indoor molds, outdoor molds) - Testing today showed: trees, weeds, grasses and outdoor molds - Avoidance measures provided. - Continue with: Xyzal (levocetirizine) 5mg  twice daily - Start taking: Flonase (fluticasone) one spray per nostril daily - You can use an extra dose of the antihistamine, if needed, for breakthrough symptoms.  - Consider nasal saline rinses 1-2 times daily to remove allergens from the nasal cavities as well as  help with mucous clearance (this is especially helpful to do before the nasal sprays are given) - Consider allergy shots as a means of long-term control. - Allergy shots "re-train" and "reset" the immune system to ignore environmental allergens and decrease the resulting immune response to those allergens (sneezing, itchy watery eyes, runny nose, nasal congestion, etc).    - Allergy shots improve symptoms in 75-85% of patients.  - We can discuss more at the next appointment if the medications are not working for you.  3. Return in about 3 months (around 06/13/2018).  Subjective:   Brandi Farley is a 57 y.o. female presenting today for evaluation of  Chief Complaint  Patient presents with  . Urticaria    Dec 2018    Brandi Farley has a history of the following: Patient Active Problem List   Diagnosis Date Noted  . Seasonal and perennial allergic rhinitis 03/13/2018  . GERD (gastroesophageal reflux disease) 01/10/2014  . Arthritis   . RECTAL BLEEDING 03/08/2008  . DIABETES MELLITUS, TYPE II 12/24/2006  . MIGRAINE HEADACHE 12/24/2006  . CARPAL TUNNEL SYNDROME 12/24/2006  . [place] DISEASE 12/24/2006  . ALLERGIC RHINITIS 12/24/2006  . GERD 12/24/2006  . IBS 12/24/2006  . 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 ARTHRITIS 12/24/2006    History obtained from: chart review and patient.  Brandi Farley was referred by Celene Squibb, MD.     Brandi Farley is a 57 y.o. female presenting for an evaluation of urticaria and possible food allergies. She never had hives in the past, even when she was following with Dr. Ishmael Holter. Her last visit  with Dr. Ishmael Holter was in 2012, although we do not have the chart here today to review.   In any case, she has developed hives which has been ongoing for almost six months. They had been misdiagnosed one a couple of occasions. She did have diarrhea for a period of time when this first started, but this is no longer a problem. She was treated as a  stomach virus at some point and she was treated the itching of the skin with various things, although nothing seems to have worked.   She did have some breathing problems associated with the urticaria at one point in December. Evidently, her face was swollen as well during that time. She did see a Paediatric nurse, who officially diagnosed her with hives. She has tried changing her detergents and changing soaps and other cosmetics; this has not seemed to have helped.   She does eat a lot of red meat and she does report tick bites that occurred last summer. She did have some tingling in her lips with seafood at one point. She has tried eliminating peanut butter without improvement. She also thinks that this might be related to wheat.   Otherwise, there is no history of other atopic diseases, including asthma, drug allergies, or stinging insect allergies. There is no significant infectious history. Vaccinations are up to date.    Past Medical History: Patient Active Problem List   Diagnosis Date Noted  . Seasonal and perennial allergic rhinitis 03/13/2018  . GERD (gastroesophageal reflux disease) 01/10/2014  . Arthritis   . RECTAL BLEEDING 03/08/2008  . DIABETES MELLITUS, TYPE II 12/24/2006  . MIGRAINE HEADACHE 12/24/2006  . CARPAL TUNNEL SYNDROME 12/24/2006  . [place] DISEASE 12/24/2006  . ALLERGIC RHINITIS 12/24/2006  . GERD 12/24/2006  . IBS 12/24/2006  . 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 ARTHRITIS 12/24/2006    Medication List:  Allergies as of 03/13/2018      Reactions   Bactrim [sulfamethoxazole-trimethoprim] Shortness Of Breath, Other (See Comments)   Cramping   Doryx [doxycycline] Shortness Of Breath   Keflex [cephalexin] Shortness Of Breath      Medication List        Accurate as of 03/13/18 10:12 PM. Always use your most recent med list.          cimetidine 400 MG tablet Commonly known as:  TAGAMET Take 400 mg by mouth 2 (two) times daily.   EPIPEN  2-PAK 0.3 mg/0.3 mL Soaj injection Generic drug:  EPINEPHrine Inject into the muscle once.   levocetirizine 5 MG tablet Commonly known as:  XYZAL Take 5 mg by mouth 2 (two) times daily.   montelukast 10 MG tablet Commonly known as:  SINGULAIR Take 1 tablet (10 mg total) by mouth at bedtime.   ranitidine 150 MG tablet Commonly known as:  ZANTAC Take 1 tablet (150 mg total) by mouth 2 (two) times daily.   Vitamin D2 400 units Tabs Take by mouth.       Birth History: non-contributory.  Developmental History: non-contributory.   Past Surgical History: Past Surgical History:  Procedure Laterality Date  . APPENDECTOMY    . COCHLEAR IMPLANT    . ESOPHAGOGASTRODUODENOSCOPY N/A 02/06/2014   Procedure: ESOPHAGOGASTRODUODENOSCOPY (EGD);  Surgeon: Rogene Houston, MD;  Location: [facility];  Service: Endoscopy;  Laterality: N/A;  1200  . TUBAL LIGATION  1991     Family History: Family History  Adopted: Yes  Problem Relation Age of Onset  . Breast cancer Mother  Late 30's-40's, brain tumor  . Lupus Maternal Aunt      Social History: Zeniya lives at home with her husband. 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 She lives in a house that is 76+ years old. There is hardwood throughout the home. They have propane heating and central cooling. There is one dog and one cat in the home. There are no dust mite coverings on the bedding. There is no tobacco exposure in the home. She was a smoker from 45 through 1999, smoking 2-3 packs per day.     Review of Systems: a 14-point review of systems is pertinent for what is mentioned in HPI.  Otherwise, all other systems were negative. Constitutional: negative other than that listed in the HPI Eyes: negative other than that listed in the HPI Ears, nose, mouth, throat, and face: negative other than that listed in the HPI Respiratory: negative other  than that listed in the HPI Cardiovascular: negative other than that listed in the HPI Gastrointestinal: negative other than that listed in the HPI Genitourinary: negative other than that listed in the HPI Integument: negative other than that listed in the HPI Hematologic: negative other than that listed in the HPI Musculoskeletal: negative other than that listed in the HPI Neurological: negative other than that listed in the HPI Allergy/Immunologic: negative other than that listed in the HPI    Objective:   Blood pressure 126/74, pulse 92, temperature 97.7 F (36.5 C), temperature source Oral, resp. rate 16, height 5\' 7"  (1.702 m), weight 232 lb 6.4 oz (105.4 kg). Body mass index is 36.4 kg/m.   Physical Exam:  General: Alert, interactive, in no acute distress. Pleasant female. Talkative.  Eyes: No conjunctival injection bilaterally, no discharge on the right, no discharge on the left and no Horner-Trantas dots present. PERRL bilaterally. EOMI without pain. No photophobia.  Ears: Right TM pearly gray with normal light reflex, Left TM pearly gray with normal light reflex, Right TM intact without perforation and Left TM intact without perforation.  Nose/Throat: External nose within normal limits and septum midline. Turbinates edematous and pale with clear discharge. Posterior oropharynx erythematous with cobblestoning in the posterior oropharynx. Tonsils 3+ without exudates.  Tongue without thrush. Neck: Supple without thyromegaly. Trachea midline. Adenopathy: no enlarged lymph nodes appreciated in the anterior cervical, occipital, axillary, epitrochlear, inguinal, or popliteal regions. Lungs: Clear to auscultation without wheezing, rhonchi or rales. No increased work of breathing. CV: Normal S1/S2. No murmurs. Capillary refill <2 seconds.  Abdomen: Nondistended, nontender. No guarding or rebound tenderness. Bowel sounds present in all fields and hyperactive  Skin: Scattered  erythematous urticarial type lesions primarily located bilatearl arms, nonvesicular. Extremities:  No clubbing, cyanosis or edema. Neuro:   Grossly intact. No focal deficits appreciated. Responsive to questions.  Diagnostic studies:   Allergy Studies:   Indoor/Outdoor Percutaneous Adult Environmental Panel: positive to bahia grass, [place] grass, johnson grass, [place] blue grass, meadow fescue grass, perennial rye grass, sweet vernal grass, lamb's quarters, sheep sorrel, rough pigweed, American beech, elm, hickory, maple, pecan pollen and Alternaria. Otherwise negative with adequate controls.   Indoor/Outdoor Selected Intradermal Environmental Panel: positive to mold mix #2, mold mix #3, mold mix #4, cat and dog. Otherwise negative with adequate controls.  Most Common Foods Panel (peanut, cashew, soy, fish mix, shellfish mix, wheat, milk, egg): negative with all with adequate controls  Allergy testing results were read and interpreted by  myself, documented by clinical staff.     Salvatore Marvel, MD [facility]

## 2018-03-16 LAB — CMP14+EGFR
A/G RATIO: 1.2 (ref 1.2–2.2)
ALBUMIN: 3.8 g/dL (ref 3.5–5.5)
ALK PHOS: 85 IU/L (ref 39–117)
ALT: 22 IU/L (ref 0–32)
AST: 17 IU/L (ref 0–40)
BUN / CREAT RATIO: 19 (ref 9–23)
BUN: 11 mg/dL (ref 6–24)
Bilirubin Total: 0.4 mg/dL (ref 0.0–1.2)
CALCIUM: 8.9 mg/dL (ref 8.7–10.2)
CO2: 22 mmol/L (ref 20–29)
Chloride: 103 mmol/L (ref 96–106)
Creatinine, Ser: 0.59 mg/dL (ref 0.57–1.00)
GFR calc Af Amer: 120 mL/min/{1.73_m2} (ref >59)
GFR, EST NON AFRICAN AMERICAN: 104 mL/min/{1.73_m2} (ref >59)
GLOBULIN, TOTAL: 3.1 g/dL (ref 1.5–4.5)
Glucose: 96 mg/dL (ref 65–99)
POTASSIUM: 4.2 mmol/L (ref 3.5–5.2)
SODIUM: 139 mmol/L (ref 134–144)
Total Protein: 6.9 g/dL (ref 6.0–8.5)

## 2018-03-16 LAB — CBC WITH DIFFERENTIAL/PLATELET
BASOS: 0 %
Basophils Absolute: 0 10*3/uL (ref 0.0–0.2)
EOS (ABSOLUTE): 0.2 10*3/uL (ref 0.0–0.4)
EOS: 2 %
HEMATOCRIT: 41.5 % (ref 34.0–46.6)
HEMOGLOBIN: 13.6 g/dL (ref 11.1–15.9)
Immature Grans (Abs): 0 10*3/uL (ref 0.0–0.1)
Immature Granulocytes: 0 %
LYMPHS ABS: 2.2 10*3/uL (ref 0.7–3.1)
Lymphs: 25 %
MCH: 27.9 pg (ref 26.6–33.0)
MCHC: 32.8 g/dL (ref 31.5–35.7)
MCV: 85 fL (ref 79–97)
MONOS ABS: 0.6 10*3/uL (ref 0.1–0.9)
Monocytes: 7 %
Neutrophils Absolute: 5.7 10*3/uL (ref 1.4–7.0)
Neutrophils: 66 %
Platelets: 381 10*3/uL — ABNORMAL HIGH (ref 150–379)
RBC: 4.87 x10E6/uL (ref 3.77–5.28)
RDW: 14.6 % (ref 12.3–15.4)
WBC: 8.7 10*3/uL (ref 3.4–10.8)

## 2018-03-16 LAB — ALPHA-GAL PANEL
Alpha Gal IgE*: 1.75 kU/L — ABNORMAL HIGH (ref <0.10)
BEEF (BOS SPP) IGE: 0.67 kU/L — AB (ref <0.35)
BEEF CLASS INTERPRETATION: 1
LAMB CLASS INTERPRETATION: 0
PORK (SUS SPP) IGE: 0.26 kU/L (ref <0.35)

## 2018-03-16 LAB — THYROID ANTIBODIES
Thyroglobulin Antibody: <1 IU/mL (ref 0.0–0.9)
Thyroperoxidase Ab SerPl-aCnc: 9 IU/mL (ref 0–34)

## 2018-03-16 LAB — ANA: Anti Nuclear Antibody(ANA): NEGATIVE

## 2018-03-16 LAB — TRYPTASE: Tryptase: 3.8 ug/L (ref 2.2–13.2)

## 2018-04-19 DIAGNOSIS — R011 Cardiac murmur, unspecified: Secondary | ICD-10-CM | POA: Insufficient documentation

## 2018-05-24 ENCOUNTER — Ambulatory Visit: Payer: BC Managed Care – PPO | Admitting: Certified Nurse Midwife

## 2018-05-24 ENCOUNTER — Encounter: Payer: Self-pay | Admitting: Certified Nurse Midwife

## 2018-05-24 ENCOUNTER — Other Ambulatory Visit (HOSPITAL_COMMUNITY)
Admission: RE | Admit: 2018-05-24 | Discharge: 2018-05-24 | Disposition: A | Discharge status: Home / Self Care | Payer: BC Managed Care – PPO | Source: Ambulatory Visit | Attending: Certified Nurse Midwife | Admitting: Certified Nurse Midwife

## 2018-05-24 VITALS — BP 120/64 | HR 70 | Resp 16 | Ht 67.25 in | Wt 232.0 lb

## 2018-05-24 DIAGNOSIS — N950 Postmenopausal bleeding: Secondary | ICD-10-CM | POA: Diagnosis not present

## 2018-05-24 DIAGNOSIS — R87610 Atypical squamous cells of undetermined significance on cytologic smear of cervix (ASC-US): Secondary | ICD-10-CM | POA: Insufficient documentation

## 2018-05-24 DIAGNOSIS — Z8669 Personal history of other diseases of the nervous system and sense organs: Secondary | ICD-10-CM

## 2018-05-24 DIAGNOSIS — Z01419 Encounter for gynecological examination (general) (routine) without abnormal findings: Secondary | ICD-10-CM

## 2018-05-24 DIAGNOSIS — Z124 Encounter for screening for malignant neoplasm of cervix: Secondary | ICD-10-CM

## 2018-05-24 NOTE — Progress Notes (Signed)
57 y.o. G73P1001 Married  Caucasian Fe here for annual exam. Periods none since 1/18.Marland Kitchen Denies vaginal bleeding or vaginal dryness, until started bleeding on 05/18/18.  Normal period like bleeding and cramping with duration 6 days, with still some spotting today. No breast tenderness with occurrence. Was treated for Lyme disease recently and given Amoxicillin and Fluconazole for 21 days prior to bleeding occurring.. No hot flashes or night sweats that have been an issue. 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 Sees PCP Dr. Nevada Crane for aex and labs, asthma management. All normal per patient. No other health issues today.        Sexually active: No.  The current method of family planning is tubal ligation.   Exercising: Yes.    walking & sit ups Smoker:  no  Health Maintenance: Pap:  02-21-15 neg, 03-02-17 neg HPV HR neg History of Abnormal Pap: no MMG:  2016 Self Breast exams: yes Colonoscopy:  2011 neg f/u 80yrs BMD:   Done long time ago TDaP:  2015 Shingles: never done Pneumonia: never done Hep C and HIV: HIV done in the 90's Labs: no   reports that she has quit smoking. She has never used smokeless tobacco. She reports that she does not drink alcohol or use drugs.  Past Medical History:  Diagnosis Date  . Acid reflux   . Arthritis   . Fibroid    Breast  . Hearing deficit   . Hormone disorder   . Seasonal allergies     Past Surgical History:  Procedure Laterality Date  . APPENDECTOMY    . COCHLEAR IMPLANT    . ESOPHAGOGASTRODUODENOSCOPY N/A 02/06/2014   Procedure: ESOPHAGOGASTRODUODENOSCOPY (EGD);  Surgeon: Rogene Houston, MD;  Location: [facility];  Service: Endoscopy;  Laterality: N/A;  1200  . TUBAL LIGATION  1991    Current Outpatient Medications  Medication Sig Dispense Refill  . cimetidine (TAGAMET) 400 MG tablet Take 400 mg by mouth 2 (two) times daily.    Marland Kitchen EPINEPHrine (EPIPEN 2-PAK) 0.3 mg/0.3 mL IJ SOAJ injection Inject into the muscle  once.    . Ergocalciferol (VITAMIN D2) 400 units TABS Take by mouth.    . levocetirizine (XYZAL) 5 MG tablet Take 5 mg by mouth 2 (two) times daily.    . montelukast (SINGULAIR) 10 MG tablet Take 1 tablet (10 mg total) by mouth at bedtime. 30 tablet 5  . ranitidine (ZANTAC) 150 MG tablet Take 1 tablet (150 mg total) by mouth 2 (two) times daily. 60 tablet 5   No current facility-administered medications for this visit.     Family History  Adopted: Yes  Problem Relation Age of Onset  . Breast cancer Mother        Late 30's-40's, brain tumor  . Lupus Maternal Aunt     ROS:  Pertinent items are noted in HPI.  Otherwise, a comprehensive ROS was negative.  Exam:   There were no vitals taken for this visit.   Ht Readings from Last 3 Encounters:  03/13/18 5\' 7"  (1.702 m)  03/02/17 5' 7.25" (1.708 m)  11/02/16 5' 7.25" (1.708 m)    General appearance: alert, cooperative and appears stated age Head: Normocephalic, without obvious abnormality, atraumatic Neck: no adenopathy, supple, symmetrical, trachea midline and thyroid normal to inspection and palpation Lungs: clear to auscultation bilaterally Breasts: normal appearance, no masses or tenderness, No nipple retraction or dimpling, No nipple discharge or bleeding, No axillary or supraclavicular adenopathy, right breast fibroid tissue  still present no change on center of breast ridge of right breast Heart: regular rate and rhythm Abdomen: soft, non-tender; no masses,  no organomegaly Extremities: extremities normal, atraumatic, no cyanosis or edema Skin: Skin color, texture, turgor normal. No rashes or lesions Lymph nodes: Cervical, supraclavicular, and axillary nodes normal. No abnormal inguinal nodes palpated Neurologic: Grossly normal   Pelvic: External genitalia:  no lesions              Urethra:  normal appearing urethra with no masses, tenderness or lesions              Bartholin's and Skene's: normal                 Vagina:  normal appearing vagina with normal color and discharge, no lesions              Cervix: multiparous appearance, no cervical motion tenderness and questionable lesion noted on upper lip of cervix vs just cervical appearance, bleeding present from cervix              Pap taken: Yes.   Bimanual Exam:  Uterus:  normal size, contour, position, consistency, mobility, non-tender and anteverted              Adnexa: normal adnexa and no mass, fullness, tenderness               Rectovaginal: Confirms               Anus:  normal sphincter tone, no lesions  Chaperone present: yes  A:  Well Woman with normal exam  Post menopausal bleeding after 16 months of amenorrhea  Asthma/allergy management with PCP  Recent treatment for Lyme disease  Hearing improved with cochlear implant  P:   Reviewed health and wellness pertinent to exam  Discussed due to more than a year since LMP will need PUS and possible endometrial biopsy for PMB, to make sure no concerns. Patient agreeable. Patient will be called with insurance information and scheduled  Continue follow up with MD's as indicated  Pap smear: yes   counseled on breast self exam, mammography screening, feminine hygiene, adequate intake of calcium and vitamin D, diet and exercise  return annually or prn  An After Visit Summary was printed and given to the patient.

## 2018-05-24 NOTE — Patient Instructions (Signed)
EXERCISE AND DIET:  We recommended that you start or continue a regular exercise program for good health. Regular exercise means any activity that makes your heart beat faster and makes you sweat.  We recommend exercising at least 30 minutes per day at least 3 days a week, preferably 4 or 5.  We also recommend a diet low in fat and sugar.  Inactivity, poor dietary choices and obesity can cause diabetes, heart attack, stroke, and kidney damage, among others.    ALCOHOL AND SMOKING:  Women should limit their alcohol intake to no more than 7 drinks/beers/glasses of wine (combined, not each!) per week. Moderation of alcohol intake to this level decreases your risk of breast cancer and liver damage. And of course, no recreational drugs are part of a healthy lifestyle.  And absolutely no smoking or even second hand smoke. Most people know smoking can cause heart and lung diseases, but did you know it also contributes to weakening of your bones? Aging of your skin?  Yellowing of your teeth and nails?  CALCIUM AND VITAMIN D:  Adequate intake of calcium and Vitamin D are recommended.  The recommendations for exact amounts of these supplements seem to change often, but generally speaking 600 mg of calcium (either carbonate or citrate) and 800 units of Vitamin D per day seems prudent. Certain women may benefit from higher intake of Vitamin D.  If you are among these women, your doctor will have told you during your visit.    PAP SMEARS:  Pap smears, to check for cervical cancer or precancers,  have traditionally been done yearly, although recent scientific advances have shown that most women can have pap smears less often.  However, every woman still should have a physical exam from her gynecologist every year. It will include a breast check, inspection of the vulva and vagina to check for abnormal growths or skin changes, a visual exam of the cervix, and then an exam to evaluate the size and shape of the uterus and  ovaries.  And after 55 years of age, a rectal exam is indicated to check for rectal cancers. We will also provide age appropriate advice regarding health maintenance, like when you should have certain vaccines, screening for sexually transmitted diseases, bone density testing, colonoscopy, mammograms, etc.   MAMMOGRAMS:  All women over 40 years old should have a yearly mammogram. Many facilities now offer a "3D" mammogram, which may cost around $50 extra out of pocket. If possible,  we recommend you accept the option to have the 3D mammogram performed.  It both reduces the number of women who will be called back for extra views which then turn out to be normal, and it is better than the routine mammogram at detecting truly abnormal areas.    COLONOSCOPY:  Colonoscopy to screen for colon cancer is recommended for all women at age 50.  We know, you hate the idea of the prep.  We agree, BUT, having colon cancer and not knowing it is worse!!  Colon cancer so often starts as a polyp that can be seen and removed at colonscopy, which can quite literally save your life!  And if your first colonoscopy is normal and you have no family history of colon cancer, most women don't have to have it again for 10 years.  Once every ten years, you can do something that may end up saving your life, right?  We will be happy to help you get it scheduled when you are ready.    Be sure to check your insurance coverage so you understand how much it will cost.  It may be covered as a preventative service at no cost, but you should check your particular policy.      Postmenopausal Bleeding Postmenopausal bleeding is any bleeding after menopause. Menopause is when a woman's period stops. Any type of bleeding after menopause is concerning. It should be checked by your doctor. Any treatment will depend on the cause. Follow these instructions at home: Watch your condition for any changes.  Avoid the use of tampons and douches as told by  your doctor.  Change your pads often.  Get regular pelvic exams and Pap tests.  Keep all appointments for tests as told by your doctor.  Contact a doctor if:  Your bleeding lasts for more than 1 week.  You have belly (abdominal) pain.  You have bleeding after sex (intercourse). Get help right away if:  You have a fever, chills, a headache, dizziness, muscle aches, and bleeding.  You have strong pain with bleeding.  You have clumps of blood (blood clots) coming from your vagina.  You have bleeding and need more than 1 pad an hour.  You feel like you are going to pass out (faint). This information is not intended to replace advice given to you by your health care provider. Make sure you discuss any questions you have with your health care provider. Document Released: 09/14/2008 Document Revised: 05/13/2016 Document Reviewed: 07/05/2013 Elsevier Interactive Patient Education  2017 [employer].

## 2018-05-25 ENCOUNTER — Telehealth: Payer: Self-pay | Admitting: Obstetrics & Gynecology

## 2018-05-25 NOTE — Telephone Encounter (Signed)
Spoke with patient regarding benefit for an ultrasound. Patient understood and agreeable. Patient ready to schedule. Patient requested an appointment after 06/06/18, due to her schedule. Patient scheduled  06/08/18 with Dr Sabra Heck. Patient aware of appointment date, arrival time and cancellation policy. Patient had no further questions.   Routing to provider for final review. Patient agreeable to disposition. Will close encounter.  Routing to Dr Sabra Heck  cc: Melvia Heaps, CNM

## 2018-05-28 LAB — CYTOLOGY - PAP
Diagnosis: UNDETERMINED — AB
HPV (WINDOPATH): NOT DETECTED

## 2018-05-30 ENCOUNTER — Encounter: Payer: Self-pay | Admitting: Cardiovascular Disease

## 2018-06-06 ENCOUNTER — Encounter: Payer: Self-pay | Admitting: Allergy & Immunology

## 2018-06-06 ENCOUNTER — Ambulatory Visit: Payer: BC Managed Care – PPO | Admitting: Allergy & Immunology

## 2018-06-06 VITALS — BP 124/74 | HR 71 | Resp 18

## 2018-06-06 DIAGNOSIS — J302 Other seasonal allergic rhinitis: Secondary | ICD-10-CM

## 2018-06-06 DIAGNOSIS — E7521 Fabry (-Anderson) disease: Secondary | ICD-10-CM | POA: Diagnosis not present

## 2018-06-06 DIAGNOSIS — T7800XD Anaphylactic reaction due to unspecified food, subsequent encounter: Secondary | ICD-10-CM | POA: Diagnosis not present

## 2018-06-06 DIAGNOSIS — J3089 Other allergic rhinitis: Secondary | ICD-10-CM

## 2018-06-06 NOTE — Patient Instructions (Addendum)
1. 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 - Information on alpha gal provided.  - Consider emu as a meat substitute.  - We will retest in March 2020.   2. Perennial and seasonal allergic rhinitis (grasses, weeds, trees, cats, dogs, indoor molds, outdoor molds) - You can try getting off of the montelukast (Singulair).  - Continue with: Xyzal (levocetirizine) 5mg  twice daily and Flonase (fluticasone) one spray per nostril daily as needed - You can use an extra dose of the antihistamine, if needed, for breakthrough symptoms.  - Consider nasal saline rinses 1-2 times daily to remove allergens from the nasal cavities as well as help with mucous clearance (this is especially helpful to do before the nasal sprays are given) - Consider allergy shots as a means of long-term control.  3. Return in about 9 months (around 03/07/2019).   Please inform us of any Emergency Department visits, hospitalizations, or changes in symptoms. Call us before going to the ED for breathing or allergy symptoms since we might be able to fit you in for a sick visit. Feel free to contact us anytime with any questions, problems, or concerns.  It was a pleasure to see you again today!  Websites that have reliable patient information: 1. American Academy of Asthma, Allergy, and Immunology: www.aaaai.org 2. Food Allergy Research and Education (FARE): foodallergy.org 3. Mothers of Asthmatics: http://www.asthmacommunitynetwork.org 4. American College of Allergy, Asthma, and Immunology: MonthlyElectricBill.co.uk   Make sure you are registered to vote!

## 2018-06-06 NOTE — Progress Notes (Signed)
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 - Information on alpha gal provided.  - Consider emu as a meat substitute.  - We will retest in March 2020.   2. Perennial and seasonal allergic rhinitis (grasses, weeds, trees, cats, dogs, indoor molds, outdoor molds) - You can try getting off of the montelukast (Singulair).  - Continue with: Xyzal (levocetirizine) 5mg  twice daily and Flonase (fluticasone) one spray per nostril daily as needed - You can use an extra dose of the antihistamine, if needed, for breakthrough symptoms.  - Consider nasal saline rinses 1-2 times daily to remove allergens from the nasal cavities as well as help with mucous clearance (this is especially helpful to do before the nasal sprays are given) - Consider allergy shots as a means of long-term control.  3. Return in about 9 months (around 03/07/2019).    Subjective:   Brandi Farley is a 57 y.o. female presenting today for follow up of  Chief Complaint  Patient presents with  . Urticaria    Brandi Farley has a history of the following: Patient Active Problem List   Diagnosis Date Noted  . Seasonal and perennial allergic rhinitis 03/13/2018  . GERD (gastroesophageal reflux disease) 01/10/2014  . Arthritis   . RECTAL BLEEDING 03/08/2008  . DIABETES MELLITUS, TYPE II 12/24/2006  . MIGRAINE HEADACHE 12/24/2006  . CARPAL TUNNEL SYNDROME 12/24/2006  . [place] DISEASE 12/24/2006  . ALLERGIC RHINITIS 12/24/2006  . GERD 12/24/2006  . IBS 12/24/2006  . 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 ARTHRITIS 12/24/2006    History obtained from: chart review and patient.  Brandi Farley's Primary Care  Provider is Celene Squibb, MD.     Brandi Farley is a 57 y.o. female presenting for a follow up visit.  She was last seen in March 2019.  At that time, we evaluated her for chronic urticaria.  We obtained a lab work-up, which noted an elevated IgE to alpha gal of 1.75.  We recommended that she avoid all red meat.  Started her on suppressive doses of antihistamines including Xyzal 5 mg twice daily and Singulair 10 mg.  Also added Zantac with worsening flares.  We did do environmental allergy testing that showed positives to trees, weeds, grasses, cats, dogs, indoor molds, and outdoor molds.  We started her on Nasacort and Xyzal.  We did discuss allergy shots but wanted to get medications time to work.  Since the last visit, she has mostly done well.  In the interim, she was diagnosed with Lyme disease in May when she woke up with a fever and swollen glands.  She was treated with antibiotics.  She did get another tick bite around 1 week ago, but was able to remove it in less than 24 hours.  Her hives have completely resolved with avoidance of red meat.  She is very unhappy with this diet.  She has been eating shrimp, [place], and chicken.  She will occasionally eats a fin fish  such as salmon.  She does have an EpiPen.  There have been no accidental exposures, but she does note that there is a particular place on her flank but develops a high of whenever she is around red meat  that is cooking, such as on the grill.  She is able to eat cows milk based products without a problem.  She has not changed any of her medications that have gelcaps.  Otherwise, there have been no changes to her past medical history, surgical history, family history, or social history.    Review of Systems: a 14-point review of systems is pertinent for what is mentioned in HPI.  Otherwise, all other systems were negative. Constitutional: negative other than that listed in the HPI Eyes: negative other than that listed in the HPI Ears,  nose, mouth, throat, and face: negative other than that listed in the HPI Respiratory: negative other than that listed in the HPI Cardiovascular: negative other than that listed in the HPI Gastrointestinal: negative other than that listed in the HPI Genitourinary: negative other than that listed in the HPI Integument: negative other than that listed in the HPI Hematologic: negative other than that listed in the HPI Musculoskeletal: negative other than that listed in the HPI Neurological: negative other than that listed in the HPI Allergy/Immunologic: negative other than that listed in the HPI    Objective:   Blood pressure 124/74, pulse 71, resp. rate 18, last menstrual period 05/18/2018, SpO2 96 %. There is no height or weight on file to calculate BMI.   Physical Exam:  General: Alert, interactive, in no acute distress. Talkative.  Eyes: No conjunctival injection bilaterally, no discharge on the right, no discharge on the left and no Horner-Trantas dots present. PERRL bilaterally. EOMI without pain. No photophobia.  Ears: Right TM pearly gray with normal light reflex, Left TM pearly gray with normal light reflex, Right TM intact without perforation and Left TM intact without perforation.  Nose/Throat: External nose within normal limits and nasal crease present. Turbinates markedly edematous and pale with clear discharge. Posterior oropharynx erythematous without cobblestoning in the posterior oropharynx. Tonsils 2+ without exudates.  Tongue without thrush. Lungs: Clear to auscultation without wheezing, rhonchi or rales. No increased work of breathing. CV: Normal S1/S2. No murmurs. Capillary refill <2 seconds.  Skin: Warm and dry, without lesions or rashes. Neuro:   Grossly intact. No focal deficits appreciated. Responsive to questions.  Diagnostic studies: none    Salvatore Marvel, MD  [facility] [place] [facility] [place]

## 2018-06-08 ENCOUNTER — Encounter: Payer: Self-pay | Admitting: Obstetrics & Gynecology

## 2018-06-08 ENCOUNTER — Other Ambulatory Visit: Payer: BC Managed Care – PPO

## 2018-06-08 ENCOUNTER — Ambulatory Visit (INDEPENDENT_AMBULATORY_CARE_PROVIDER_SITE_OTHER): Payer: BC Managed Care – PPO | Admitting: Obstetrics & Gynecology

## 2018-06-08 ENCOUNTER — Other Ambulatory Visit: Payer: Self-pay | Admitting: *Deleted

## 2018-06-08 ENCOUNTER — Ambulatory Visit (INDEPENDENT_AMBULATORY_CARE_PROVIDER_SITE_OTHER): Payer: BC Managed Care – PPO

## 2018-06-08 ENCOUNTER — Other Ambulatory Visit: Payer: BC Managed Care – PPO | Admitting: Obstetrics & Gynecology

## 2018-06-08 VITALS — BP 116/80 | HR 60 | Resp 14 | Ht 67.25 in | Wt 232.0 lb

## 2018-06-08 DIAGNOSIS — N950 Postmenopausal bleeding: Secondary | ICD-10-CM

## 2018-06-08 DIAGNOSIS — N838 Other noninflammatory disorders of ovary, fallopian tube and broad ligament: Secondary | ICD-10-CM

## 2018-06-08 DIAGNOSIS — N839 Noninflammatory disorder of ovary, fallopian tube and broad ligament, unspecified: Secondary | ICD-10-CM | POA: Diagnosis not present

## 2018-06-08 NOTE — Progress Notes (Signed)
Follow-up pelvic ultrasound scheduled for 08-17-18 at 1230 here is office.

## 2018-06-08 NOTE — Progress Notes (Signed)
57 y.o. G38P1001 Married Caucasian female here for pelvic ultrasound due to PMP bleeding.  She felt this was like a period and she bled for 7 days.  States she did not feel "hormonal" before this happened but it did feel just like a period.  Denies breast tenderness, acne, pelvic pain.  Denies vaginal discharge.  Bleeding has stopped.    Last cycles before this was 1/18.  Pap was ASCUS with neg HR HPV 6/19.  Patient's last menstrual period was 05/18/2018 (exact date).  Contraception: BTL  Findings: UTERUS: 8.0 x 4.7 x 3.8cm with three fibroids, 2cm and less EMS: 4.69mm ADNEXA: Left ovary: 31. X 2.4 x 1.8cm with 2.5 x 2.0 x 2.7cm simple cyst       Right ovary: 2.5 x 1.8 x 2.1cm with possible paratubal cyst measuring 30 x 74mm with single septation CUL DE SAC: no free fluid  Endometrial biopsy recommended.  Discussed with patient.  Verbal and written consent obtained.   Procedure:  Speculum placed.  Cervix visualized and cleansed with betadine prep.  A single toothed tenaculum was not applied to the anterior lip of the cervix.  Endometrial pipelle was advanced through the cervix into the endometrial cavity without difficulty.  Pipelle passed to 6.5cm.  Suction applied and pipelle removed with good tissue sample obtained.  Tenculum removed.  No bleeding noted.  Patient tolerated procedure well.  Discussion:  Findings reviewed with pt.  I feel she needs repeat PUS in 2-3 months to recheck ovaries and reassess right cystic finding.  Pathology will be called to pt and additional results discsused.  Assessment:  PMP bleeding Uterine fibroids Thickened endometrium 2.7cm left ovarian cyst 2.5cm right paratubal cyst  Plan:  Endometrial biopsy pending Repeat PUS 2-3 months.   ~15 minutes spent with patient >50% of time was in face to face discussion of above.

## 2018-06-21 ENCOUNTER — Ambulatory Visit: Payer: BC Managed Care – PPO | Admitting: Cardiology

## 2018-06-21 ENCOUNTER — Other Ambulatory Visit: Payer: Self-pay

## 2018-06-21 ENCOUNTER — Encounter: Payer: Self-pay | Admitting: Cardiology

## 2018-06-21 VITALS — BP 144/76 | HR 85 | Ht 67.0 in | Wt 234.0 lb

## 2018-06-21 DIAGNOSIS — R079 Chest pain, unspecified: Secondary | ICD-10-CM | POA: Diagnosis not present

## 2018-06-21 DIAGNOSIS — R0602 Shortness of breath: Secondary | ICD-10-CM | POA: Diagnosis not present

## 2018-06-21 NOTE — Progress Notes (Signed)
Clinical Summary Brandi Farley is a 57 y.o.female seen as new consult, referred by Dr Nevada Crane for chest pain.   1. Chest pain - started about 3 months ago. Sharp mid to left chest, can be throbbing into neck. +SOB. Can occur at rest or with activity. Lasts a few seconds to few minutes. 1-5/10 in severity at its worst. Not psoitional. Occurs daily, only one intesnse episode - some DOE with activities, example walking up stairs at her home  CAD risk factors: prediabetes, former smoker x 23 years. Brother with CAD blockage early 55s - Cant run on treadmill due knee pain.    Past Medical History:  Diagnosis Date  . Acid reflux   . Arthritis   . Fibroid    Breast  . Hearing deficit   . Hormone disorder   . Lyme disease   . Seasonal allergies      Allergies  Allergen Reactions  . Bactrim [Sulfamethoxazole-Trimethoprim] Shortness Of Breath and Other (See Comments)    Cramping  . Doryx [Doxycycline] Shortness Of Breath  . Keflex [Cephalexin] Shortness Of Breath  . Alpha-Gal Hives    Can't do pork     Current Outpatient Medications  Medication Sig Dispense Refill  . aspirin-acetaminophen-caffeine (EXCEDRIN MIGRAINE) 250-250-65 MG tablet Take by mouth every 6 (six) hours as needed for headache.    . cimetidine (TAGAMET) 400 MG tablet Take 400 mg by mouth 2 (two) times daily.    Marland Kitchen EPINEPHrine (EPIPEN 2-PAK) 0.3 mg/0.3 mL IJ SOAJ injection Inject into the muscle once.    . Ergocalciferol (VITAMIN D2) 400 units TABS Take by mouth.    . Ibuprofen (ADVIL PO) Take by mouth as needed.    Marland Kitchen levocetirizine (XYZAL) 5 MG tablet Take 5 mg by mouth 2 (two) times daily.    . montelukast (SINGULAIR) 10 MG tablet Take 1 tablet (10 mg total) by mouth at bedtime. 30 tablet 5  . ranitidine (ZANTAC) 150 MG tablet Take 1 tablet (150 mg total) by mouth 2 (two) times daily. 60 tablet 5   No current facility-administered medications for this visit.      Past Surgical History:  Procedure  Laterality Date  . APPENDECTOMY    . COCHLEAR IMPLANT    . ESOPHAGOGASTRODUODENOSCOPY N/A 02/06/2014   Procedure: ESOPHAGOGASTRODUODENOSCOPY (EGD);  Surgeon: Rogene Houston, MD;  Location: [facility];  Service: Endoscopy;  Laterality: N/A;  1200  . KNEE SURGERY Right   . TUBAL LIGATION  1991     Allergies  Allergen Reactions  . Bactrim [Sulfamethoxazole-Trimethoprim] Shortness Of Breath and Other (See Comments)    Cramping  . Doryx [Doxycycline] Shortness Of Breath  . Keflex [Cephalexin] Shortness Of Breath  . Alpha-Gal Hives    Can't do pork      Family History  Adopted: Yes  Problem Relation Age of Onset  . Breast cancer Mother        Late 30's-40's, brain tumor  . Lupus Maternal Aunt      Social History Brandi Farley reports that she has quit smoking. She has never used smokeless tobacco. Brandi Farley reports that she does not drink alcohol.   Review of Systems CONSTITUTIONAL: No weight loss, fever, chills, weakness or fatigue.  HEENT: Eyes: No visual loss, blurred vision, double vision or yellow sclerae.No hearing loss, sneezing, congestion, runny nose or sore throat.  SKIN: No rash or itching.  CARDIOVASCULAR: per hpi RESPIRATORY: per hpi GASTROINTESTINAL: No anorexia, nausea, vomiting or diarrhea. No abdominal  pain or blood.  GENITOURINARY: No burning on urination, no polyuria NEUROLOGICAL: No headache, dizziness, syncope, paralysis, ataxia, numbness or tingling in the extremities. No change in bowel or bladder control.  MUSCULOSKELETAL: No muscle, back pain, joint pain or stiffness.  LYMPHATICS: No enlarged nodes. No history of splenectomy.  PSYCHIATRIC: No history of depression or anxiety.  ENDOCRINOLOGIC: No reports of sweating, cold or heat intolerance. No polyuria or polydipsia.  Marland Kitchen   Physical Examination Vitals:   06/21/18 0829  BP: (!) 144/76  Pulse: 85  SpO2: 95%   Vitals:   06/21/18 0829  Weight: 234 lb (106.1 kg)  Height: 5\' 7"  (1.702 m)     Gen: resting comfortably, no acute distress HEENT: no scleral icterus, pupils equal round and reactive, no palptable cervical adenopathy,  CV: RRR, no m/r/g, no jvd Resp: Clear to auscultation bilaterally GI: abdomen is soft, non-tender, non-distended, normal bowel sounds, no hepatosplenomegaly MSK: extremities are warm, no edema.  Skin: warm, no rash Neuro:  no focal deficits Psych: appropriate affect     Assessment and Plan  1. Chest pain - recent chest pain and SOB. We will start workup with echo, pending results would consider lexicsan - EKG today shows SR, no ischemic changes       Arnoldo Lenis, M.D

## 2018-06-21 NOTE — Patient Instructions (Signed)
Your physician recommends that you schedule a follow-up appointment in: TO BE DETERMINED AFTER TESTING   Your physician recommends that you continue on your current medications as directed. Please refer to the Current Medication list given to you today.  Your physician has requested that you have an echocardiogram. Echocardiography is a painless test that uses sound waves to create images of your heart. It provides your doctor with information about the size and shape of your heart and how well your heart's chambers and valves are working. This procedure takes approximately one hour. There are no restrictions for this procedure.  Thank you for choosing [facility]!!

## 2018-06-29 ENCOUNTER — Encounter: Payer: Self-pay | Admitting: Cardiology

## 2018-07-12 ENCOUNTER — Ambulatory Visit (INDEPENDENT_AMBULATORY_CARE_PROVIDER_SITE_OTHER): Payer: BC Managed Care – PPO

## 2018-07-12 ENCOUNTER — Other Ambulatory Visit: Payer: Self-pay

## 2018-07-12 DIAGNOSIS — R079 Chest pain, unspecified: Secondary | ICD-10-CM | POA: Diagnosis not present

## 2018-07-18 ENCOUNTER — Telehealth: Payer: Self-pay | Admitting: *Deleted

## 2018-07-18 DIAGNOSIS — R079 Chest pain, unspecified: Secondary | ICD-10-CM

## 2018-07-18 NOTE — Telephone Encounter (Signed)
Pt will call back regarding to schedule stress test - orders placed

## 2018-07-18 NOTE — Telephone Encounter (Signed)
-----   Message from Acquanetta Chain, LPN sent at 01/27/1387 10:08 AM EDT -----   ----- Message ----- From: Arnoldo Lenis, MD Sent: 07/14/2018   1:36 PM To: Acquanetta Chain, LPN  Echo looks good, please order lexiscan for chest pain. Does not need to hold any meds   J BrancH MD

## 2018-07-19 ENCOUNTER — Encounter: Payer: Self-pay | Admitting: *Deleted

## 2018-07-21 ENCOUNTER — Telehealth: Payer: Self-pay | Admitting: Cardiology

## 2018-07-21 ENCOUNTER — Encounter: Payer: Self-pay | Admitting: Cardiology

## 2018-07-21 NOTE — Telephone Encounter (Signed)
Pre-cert Verification for the following procedure   Lexiscan scheduled for 08-10-2018 at [facility]

## 2018-08-03 ENCOUNTER — Telehealth: Payer: Self-pay | Admitting: Certified Nurse Midwife

## 2018-08-03 NOTE — Telephone Encounter (Signed)
Routing to [place] for review as Melvia Heaps CNM and Dr.Miller are out of the office today. Patient was seen on 06/08/2018 for evaluation of PMB. Contraception BTL. PUS showed UTERUS: 8.0 x 4.7 x 3.8cm with three fibroids, 2cm and less EMS: 4.56mm ADNEXA: Left ovary: 31. X 2.4 x 1.8cm with 2.5 x 2.0 x 2.7cm simple cyst Right ovary: 2.5 x 1.8 x 2.1cm with possible paratubal cyst measuring 30 x 6mm with single septation CUL DE SAC: no free fluid. EMB on 06/08/2018 was normal. Has another PUS scheduled for 8/29. Anything further needed?

## 2018-08-03 NOTE — Telephone Encounter (Signed)
Patient sent the following message through [place]. Routing to triage to assist patient with request.   Not really a question, just a report.    Dr. Hollice Espy asked me to let the office know if I resumed bleeding. Last week, I had breast tenderness generally associated with what I normally experienced prior to having my period, and this morning I began spotting. I've also been using Monistat 7, because I have a yeast infection (yay! summer! hates it, we do!). I've only got 2 more doses of Monistat (tonight and tomorrow) before I'm done.    I have an upcoming appointment on August 29 for another vaginal ultrasound, so I'll keep track of this month's bleeding and give you an update then. However, at this point, the bleeding seems to exactly like a normal, albeit light, period. If anything changes, I'll send another email or call.    T

## 2018-08-03 NOTE — Telephone Encounter (Signed)
I would have her keep her appointment for her follow up pelvic ultrasound.  Her endometrial biopsy was benign.

## 2018-08-04 NOTE — Telephone Encounter (Signed)
Call to home number as listed on DPR and snapshot. Female answered. Declined to transfer call to patient-- call disconnected.

## 2018-08-04 NOTE — Telephone Encounter (Signed)
MyChart message to patient:  08-04-18 10:30 AM  Good morning Brandi Farley,   My name is Raquel Sarna, one of the Triage Nurses at [facility]. I was unable to reach you at your home number provided. I just wanted to let you know that Dr. Quincy Simmonds reviewed your MyChart message, as Manon Hilding is out of the office today. Dr. Quincy Simmonds states to keep your appointment for your Pelvic Ultrasound that is scheduled with Dr. Sabra Heck on 08-17-18 as your endometrial biopsy was benign. Please call the office if you have any additional questions.   Thank you,  Raquel Sarna, RN    This MyChart message has not been read.

## 2018-08-10 ENCOUNTER — Encounter (HOSPITAL_COMMUNITY): Payer: Self-pay

## 2018-08-10 ENCOUNTER — Ambulatory Visit (HOSPITAL_COMMUNITY)
Admission: RE | Admit: 2018-08-10 | Discharge: 2018-08-10 | Disposition: A | Discharge status: Home / Self Care | Payer: BC Managed Care – PPO | Source: Ambulatory Visit | Attending: Cardiology | Admitting: Cardiology

## 2018-08-10 ENCOUNTER — Encounter (HOSPITAL_BASED_OUTPATIENT_CLINIC_OR_DEPARTMENT_OTHER)
Admission: RE | Admit: 2018-08-10 | Discharge: 2018-08-10 | Disposition: A | Discharge status: Home / Self Care | Payer: BC Managed Care – PPO | Source: Ambulatory Visit | Attending: Cardiology | Admitting: Cardiology

## 2018-08-10 DIAGNOSIS — R079 Chest pain, unspecified: Secondary | ICD-10-CM

## 2018-08-10 LAB — NM MYOCAR MULTI W/SPECT W/WALL MOTION / EF
CHL CUP RESTING HR STRESS: 55 {beats}/min
LHR: 0.27
LVDIAVOL: 65 mL (ref 46–106)
LVSYSVOL: 16 mL
Peak HR: 86 {beats}/min
SDS: 1
SRS: 1
SSS: 2
TID: 1.12

## 2018-08-10 MED ORDER — REGADENOSON 0.4 MG/5ML IV SOLN
INTRAVENOUS | Status: AC
  Administered 2018-08-10: 0.4 mg via INTRAVENOUS
  Filled 2018-08-10: qty 5

## 2018-08-10 MED ORDER — TECHNETIUM TC 99M TETROFOSMIN IV KIT
30.0000 | PACK | Freq: Once | INTRAVENOUS | Status: AC | PRN
  Administered 2018-08-10: 33 via INTRAVENOUS

## 2018-08-10 MED ORDER — TECHNETIUM TC 99M TETROFOSMIN IV KIT
10.0000 | PACK | Freq: Once | INTRAVENOUS | Status: AC | PRN
  Administered 2018-08-10: 11 via INTRAVENOUS

## 2018-08-10 MED ORDER — REGADENOSON 0.4 MG/5ML IV SOLN
INTRAVENOUS | Status: AC
  Filled 2018-08-10: qty 5

## 2018-08-10 MED ORDER — SODIUM CHLORIDE 0.9% FLUSH
INTRAVENOUS | Status: AC
  Filled 2018-08-10: qty 10

## 2018-08-10 MED ORDER — SODIUM CHLORIDE 0.9% FLUSH
INTRAVENOUS | Status: AC
  Administered 2018-08-10: 10 mL via INTRAVENOUS
  Filled 2018-08-10: qty 10

## 2018-08-11 ENCOUNTER — Telehealth: Payer: Self-pay | Admitting: *Deleted

## 2018-08-11 NOTE — Telephone Encounter (Signed)
-----   Message from Arnoldo Lenis, MD sent at 08/11/2018 12:55 PM EDT ----- Stress test overall looks good. There is a slightly abnormal area that looks to be due to breast shadowing and not a true blockage, if it did represent a true blockage its so minor that it would not create any significant risk. Overall all her heart testing has been reassuring. Needs to f/u with pcp to discuss other causes of her symptoms, can f/u with [place] 6 months  Zandra Abts MD

## 2018-08-11 NOTE — Telephone Encounter (Signed)
Pt aware and voiced understanding - would like results sent as mychart message - routed to pcp - place recall

## 2018-08-14 ENCOUNTER — Other Ambulatory Visit (HOSPITAL_COMMUNITY): Payer: Self-pay | Admitting: Internal Medicine

## 2018-08-14 DIAGNOSIS — Z1231 Encounter for screening mammogram for malignant neoplasm of breast: Secondary | ICD-10-CM

## 2018-08-15 ENCOUNTER — Other Ambulatory Visit: Payer: Self-pay | Admitting: Allergy & Immunology

## 2018-08-16 ENCOUNTER — Encounter: Payer: Self-pay | Admitting: Obstetrics & Gynecology

## 2018-08-16 ENCOUNTER — Encounter: Payer: Self-pay | Admitting: Certified Nurse Midwife

## 2018-08-16 ENCOUNTER — Ambulatory Visit: Payer: BC Managed Care – PPO | Admitting: Certified Nurse Midwife

## 2018-08-16 ENCOUNTER — Telehealth: Payer: Self-pay | Admitting: Obstetrics & Gynecology

## 2018-08-16 ENCOUNTER — Other Ambulatory Visit: Payer: Self-pay

## 2018-08-16 VITALS — BP 114/68 | HR 68 | Temp 97.6°F | Resp 16 | Ht 67.25 in | Wt 233.0 lb

## 2018-08-16 DIAGNOSIS — B3731 Acute candidiasis of vulva and vagina: Secondary | ICD-10-CM

## 2018-08-16 DIAGNOSIS — R319 Hematuria, unspecified: Secondary | ICD-10-CM | POA: Diagnosis not present

## 2018-08-16 DIAGNOSIS — B373 Candidiasis of vulva and vagina: Secondary | ICD-10-CM | POA: Diagnosis not present

## 2018-08-16 DIAGNOSIS — N390 Urinary tract infection, site not specified: Secondary | ICD-10-CM | POA: Diagnosis not present

## 2018-08-16 LAB — POCT URINALYSIS DIPSTICK
BILIRUBIN UA: NEGATIVE
Glucose, UA: NEGATIVE
KETONES UA: NEGATIVE
Leukocytes, UA: NEGATIVE
NITRITE UA: NEGATIVE
PH UA: 5 (ref 5.0–8.0)
PROTEIN UA: NEGATIVE
UROBILINOGEN UA: NEGATIVE U/dL — AB

## 2018-08-16 MED ORDER — NITROFURANTOIN MONOHYD MACRO 100 MG PO CAPS: ORAL_CAPSULE | ORAL | 0 refills | Status: DC

## 2018-08-16 MED ORDER — FLUCONAZOLE 150 MG PO TABS: ORAL_TABLET | ORAL | 0 refills | Status: DC

## 2018-08-16 NOTE — Telephone Encounter (Signed)
Response to patient via mychart.

## 2018-08-16 NOTE — Telephone Encounter (Signed)
Patient called to office to schedule appointment. Scheduled with Melvia Heaps at 11:00 today.

## 2018-08-16 NOTE — Patient Instructions (Signed)

## 2018-08-16 NOTE — Telephone Encounter (Signed)
Noted message. Pt scheduled. Encounter closed.

## 2018-08-16 NOTE — Telephone Encounter (Signed)
Patient sent the following correspondence through [place]. Routing to triage to assist patient with request.  I am supposed to come in tomorrow for a follow-up transvaginal ultrasound. I think I have a bladder infection. Pelvic pain and a constant need to urinate. I have some Amoxicillin 500 mg at home and I began taking it 3X a day on Tues. (it was prescribed in case I had a recurrence of Lyme disease--I didn't so I haven't taken it until now). Some of the pain has receded but the infection is still there. Is there any way that I can talk to Dr. Sabra Heck about this tomorrow after the ultrasound? Thank you, T

## 2018-08-16 NOTE — Progress Notes (Signed)
57 y.o. Married Caucasian female G1P1001 here with complaint of UTI, with onset  on 3 days ago. Patient was having low back pain and emptying better now. She started on Amoxicillin 500 mg tid for the past 2 days and symptoms are improving. Had low grade fever 2 days ago and but none in the past 24 hours. Patient complaining of urinary frequency/urgency with urination. Patient denies fever, chills, nausea today.. Dysuria has improved. No new personal products. Patient feels not related to sexual activity. Denies any vaginal symptoms.    Menopausal with vaginal dryness but uses moisturizer as needed.. Patient admits to  not drinking adequate water intake. No other health issues today.  Review of Systems  Constitutional: Positive for malaise/fatigue. Negative for chills and fever.  Gastrointestinal: Negative for abdominal pain, nausea and vomiting.  Genitourinary: Positive for frequency and urgency. Negative for dysuria and hematuria.  Musculoskeletal: Negative.   Neurological: Negative.   Psychiatric/Behavioral: Negative.     O: Healthy female WDWN Affect: Normal, orientation x 3 Skin : warm and dry CVAT: negative bilateral Abdomen: positive for suprapubic tenderness and urgency with palpation  Pelvic exam: External genital area: normal, no lesions Bladder,Urethra tender, Urethral meatus: tender, red, prominent Vagina: white cheese like vaginal discharge, normal appearance  Wet prep taken Cervix: normal, non tender Uterus:normal,non tender Adnexa: normal non tender, no fullness or masses  Wet prep: Koh, Saline positive for yeast, occasional RBC  A: UTI not resolving on Amoxicillin patient had at home Normal pelvic exam poct urine- rbc tr Yeast vaginitis  P: Reviewed findings of UTI and need for change in treatment. Instructed to stop Amoxicillin. TO:[id number] see order with instructions DXI:PJASN micro, culture Reviewed warning signs and symptoms of UTI and need to advise if  occurring. Encouraged to limit soda, tea, and coffee and be sure to increase water intake.  Reviewed yeast vaginitis finding and need for treatment. Rx Diflucan see order with instructions. Call if not resolving. Questions addressed.   RV prn

## 2018-08-17 ENCOUNTER — Ambulatory Visit: Payer: BC Managed Care – PPO | Admitting: Obstetrics & Gynecology

## 2018-08-17 ENCOUNTER — Ambulatory Visit (INDEPENDENT_AMBULATORY_CARE_PROVIDER_SITE_OTHER): Payer: BC Managed Care – PPO

## 2018-08-17 VITALS — BP 130/64 | HR 64 | Resp 16 | Ht 67.0 in

## 2018-08-17 DIAGNOSIS — N839 Noninflammatory disorder of ovary, fallopian tube and broad ligament, unspecified: Secondary | ICD-10-CM | POA: Diagnosis not present

## 2018-08-17 DIAGNOSIS — N939 Abnormal uterine and vaginal bleeding, unspecified: Secondary | ICD-10-CM | POA: Diagnosis not present

## 2018-08-17 DIAGNOSIS — D251 Intramural leiomyoma of uterus: Secondary | ICD-10-CM | POA: Diagnosis not present

## 2018-08-17 DIAGNOSIS — N950 Postmenopausal bleeding: Secondary | ICD-10-CM

## 2018-08-17 DIAGNOSIS — N838 Other noninflammatory disorders of ovary, fallopian tube and broad ligament: Secondary | ICD-10-CM

## 2018-08-17 LAB — URINALYSIS, MICROSCOPIC ONLY
Bacteria, UA: NONE SEEN
CASTS: NONE SEEN /LPF

## 2018-08-17 LAB — URINE CULTURE

## 2018-08-17 NOTE — Progress Notes (Signed)
57 y.o. G82P1001 Married Caucasian female here for pelvic ultrasound due to episode of bleeding that occurred 05/18/2018.  This felt like a normal cycle to her with typical bleeding and cramping that lasts 6 days.  She had not experienced any bleeding since January 2018.  Patient did have another cycle again in August that lasted a similar amount of time.  Denies any pelvic pain.    Patient did have an ultrasound 06/08/18 showing small fibroids, left ovarian cyst measuring 2.5 cm and a probable paratubal cyst measuring 30 x 24 mm with a single septation.  This is being assessed again today.  Endometrial biopsy was negative for abnormal cells in June.  Pap 05/24/18 was ASCUS with neg HR HPV.  Patient's last menstrual period was 08/10/2018 (exact date).  Contraception: BTL  Findings:  UTERUS: 7.8 x 5 x 4.4 cm with small fibroids Endometrium: 4.5 mm, cannot rule out submucosal versus encroaching 7 mm fibroid ADNEXA: Left ovary: 2.4 x 1.7 x 1.5 cm with 2.2 x 2.0 x 2.5 cm cyst.  This was previously 25 x 20 x 27 mm.  It is echo-free and avascular.       Right ovary: 2.4 x 2.0 x 1.5 cm with separate septated cyst measuring 30 x 24 mm.  This was previously 35 x 21 mm. CUL DE SAC: No free fluid  Discussion: Findings reviewed with patient.  Both the ovarian cyst and right paratubal cyst are still present.  There is no enlargement of either one.  With a second episode of bleeding, I am going to test her hormonal function today.    Assessment:  PMP bleeding Small uterine fibroids Right probable paratubal cyst measuring 30 x 34mm Left ovarian cyst, avascular, measuing 22 x 20 x 31mm  Plan:  FSH and estradiol will be obtained today.  If this is in menopausal range, should consider hysteroscopy for additional evaluation as she should not be bleeding.    ~20 minutes spent with patient >50% of time was in face to face discussion of above.

## 2018-08-18 ENCOUNTER — Encounter: Payer: Self-pay | Admitting: Obstetrics & Gynecology

## 2018-08-18 LAB — FOLLICLE STIMULATING HORMONE: FSH: 38.8 m[IU]/mL

## 2018-08-18 LAB — ESTRADIOL: Estradiol: 25.2 pg/mL

## 2018-08-22 ENCOUNTER — Inpatient Hospital Stay (HOSPITAL_COMMUNITY): Admission: RE | Admit: 2018-08-22 | Payer: BC Managed Care – PPO | Source: Ambulatory Visit

## 2018-08-23 ENCOUNTER — Ambulatory Visit (HOSPITAL_COMMUNITY)
Admission: RE | Admit: 2018-08-23 | Discharge: 2018-08-23 | Disposition: A | Discharge status: Home / Self Care | Payer: BC Managed Care – PPO | Source: Ambulatory Visit | Attending: Internal Medicine | Admitting: Internal Medicine

## 2018-08-23 ENCOUNTER — Encounter (HOSPITAL_COMMUNITY): Payer: BC Managed Care – PPO

## 2018-08-23 DIAGNOSIS — Z1231 Encounter for screening mammogram for malignant neoplasm of breast: Secondary | ICD-10-CM | POA: Diagnosis not present

## 2018-08-28 ENCOUNTER — Telehealth: Payer: Self-pay | Admitting: Emergency Medicine

## 2018-08-28 DIAGNOSIS — N950 Postmenopausal bleeding: Secondary | ICD-10-CM

## 2018-08-28 NOTE — Telephone Encounter (Signed)
-----   Message from Megan Salon, MD sent at 08/25/2018  5:59 PM EDT ----- Called pt. No answer at home or work number.  DPR reviewed.  Left detailed message about test results.  advsied if she has additional bleeding, I will want to proceed with a hysteroscopy with D&C.  She does need repeat PUS in 6 months.  Pt aware of this.  Please call to schedule.  Thanks.

## 2018-08-28 NOTE — Telephone Encounter (Signed)
Spoke with patient. She was unable to receive the message Dr. Sabra Heck left her.  She is given message to call us with any further bleeding.  6 month ultrasound scheduled.

## 2018-11-14 ENCOUNTER — Encounter: Payer: Self-pay | Admitting: Obstetrics & Gynecology

## 2018-11-14 ENCOUNTER — Telehealth: Payer: Self-pay | Admitting: Obstetrics & Gynecology

## 2018-11-14 NOTE — Telephone Encounter (Signed)
Spoke with patient. 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  States she is having vaginal bleeding, feels like a normnal cycle is coming. Not heavy per patient.  Office visit offered for today and or tomorrow. She declines due to her schedule and car availability. Also declines appointment for 11/20/18. Accepts office visit for evaluation by Dr. Sabra Heck 11/21/18. Advised very limited time options for surgical scheduling remain and delay in evaluation further delays surgical scheduling. Patient voices understanding. Advised to call back if bleeding becomes heavy or pt develops any new concerns. Voices understanding of plan of care.  Encounter to Dr. Sabra Heck.

## 2018-11-14 NOTE — Telephone Encounter (Signed)
Message   At my last visit with Dr. Sabra Heck, she told me to let the office know if I started my period again. I began spotting again on Saturday, November 23, and it appears that I'll experience a full cycle by the end of this week. Dr. Sabra Heck suggest that if I did start my period again, we might need to do an D&C. If this is something that we need to do, then we must schedule it prior to Jan. 1, 2020, because I have met my insurance deductible for 2019. Any time after Jan. 1 will have to wait until my flex plan renews in May 2020.

## 2018-11-14 NOTE — Telephone Encounter (Signed)
Routing to Lamont Snowball, RN, of possible surgery in December.  Pt has appt on 12/3.

## 2018-11-15 NOTE — Telephone Encounter (Signed)
Return call from patient.  Patient states she has a couple of issues going on that she needs to discuss with Dr Sabra Heck. Interested in possible surgery before the end of December. Advised there are very limited options remaining in December. Type of procedure needed will determine what options are available for her.  Patient will be contacted after appointment with Dr Sabra Heck. She requests I send My Chart message with dates available.     Encounter closed.

## 2018-11-15 NOTE — Telephone Encounter (Signed)
Patient returning call to Sally. °

## 2018-11-15 NOTE — Telephone Encounter (Signed)
Call to patient to review surgery scheduling process. Left message to call back.

## 2018-11-15 NOTE — Telephone Encounter (Signed)
Spoke with patient in regards to possible surgery benefits. Patient understood and is agreeable. Patient is aware this this is the professional benefits. Patient is scheduled an appointment with Dr Sabra Heck on 11/21/18. Patient will confirm at that appointment surgery plan.   cc: Lamont Snowball, RN

## 2018-11-21 ENCOUNTER — Encounter: Payer: Self-pay | Admitting: Obstetrics & Gynecology

## 2018-11-21 ENCOUNTER — Ambulatory Visit: Payer: BC Managed Care – PPO | Admitting: Obstetrics & Gynecology

## 2018-11-21 VITALS — BP 118/64 | HR 76 | Resp 16 | Ht 67.0 in | Wt 235.0 lb

## 2018-11-21 DIAGNOSIS — N950 Postmenopausal bleeding: Secondary | ICD-10-CM | POA: Diagnosis not present

## 2018-11-21 DIAGNOSIS — L292 Pruritus vulvae: Secondary | ICD-10-CM

## 2018-11-21 DIAGNOSIS — N83202 Unspecified ovarian cyst, left side: Secondary | ICD-10-CM

## 2018-11-21 NOTE — Progress Notes (Signed)
GYNECOLOGY  VISIT  CC:   Vaginal bleeidng  HPI: 57 y.o. G55P1001 Married White or Caucasian female here for vaginal bleeding that started 11/22-11/26.  She did have a yeast infection at this time and used the Monistat 7 for treatment.  She felt, initially, that the spotting was due to the monistat but then it continued.  Bleeding was never heavy.  She has associated bloating which has persisted since the spotting started.    Bleeding has been evaluated with endometrial biopy on 06/08/18 as well as an ultrasound.  Biopsy showed: Endometrium, biopsy - ATROPHIC ENDOMETRIUM WITH BREAKDOWN IN A BACKGROUND OF MUCOID AND INFLAMMATORY MATERIAL. - NO HYPERPLASIA, ATYPIA, OR MALIGNANCY IDENTIFIED.  Pap smear was normal 05/24/18.  Ultrasound showed 4.71mm endometrium with encroaching 43mm fibroid.  She also has a left ovarian cyst that is echo-free and avascular measuring 25 x 20 x 48mm.  As well there is a separate septate cyst on the right adnexa measuring 30 x 24.  Follow up ultrasound was planned for six months.  Reports continued issues with vulvar itching.  This is all external.  It was improved with the Monistat 7 but has returned.  She has used multiple topical lotions without success.  She has used topical steroids as well but this hasn't helped.  Has seen an allergist.  Is taking Zyrtec.  Was on Xyzal but was causing migraines.  Anti-histamines do not seem to have helped, either.  GYNECOLOGIC HISTORY: Patient's last menstrual period was 11/10/2018. Contraception: BTL Menopausal hormone therapy: none  Patient Active Problem List   Diagnosis Date Noted  . 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 Seasonal and perennial allergic rhinitis 03/13/2018  . Bilateral sensorineural hearing loss 11/24/2015  . GERD (gastroesophageal reflux disease) 01/10/2014  . Arthritis   . RECTAL BLEEDING 03/08/2008  . DIABETES MELLITUS, TYPE II 12/24/2006  . MIGRAINE HEADACHE 12/24/2006  . CARPAL TUNNEL  SYNDROME 12/24/2006  . English DISEASE 12/24/2006  . ALLERGIC RHINITIS 12/24/2006  . GERD 12/24/2006  . IBS 12/24/2006  . 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 ARTHRITIS 12/24/2006    Past Medical History:  Diagnosis Date  . Acid reflux   . Arthritis   . Asthma   . Fibroid    Breast  . Hearing deficit   . Hormone disorder   . Lyme disease   . Seasonal allergies     Past Surgical History:  Procedure Laterality Date  . APPENDECTOMY    . COCHLEAR IMPLANT    . ESOPHAGOGASTRODUODENOSCOPY N/A 02/06/2014   Procedure: ESOPHAGOGASTRODUODENOSCOPY (EGD);  Surgeon: Rogene Houston, MD;  Location: [facility];  Service: Endoscopy;  Laterality: N/A;  1200  . KNEE SURGERY Right   . TUBAL LIGATION  1991    MEDS:   Current Outpatient Medications on File Prior to Visit  Medication Sig Dispense Refill  . aspirin-acetaminophen-caffeine (EXCEDRIN MIGRAINE) 250-250-65 MG tablet Take by mouth every 6 (six) hours as needed for headache.    . cimetidine (TAGAMET) 400 MG tablet Take 400 mg by mouth 2 (two) times daily.    Marland Kitchen EPINEPHrine (EPIPEN 2-PAK) 0.3 mg/0.3 mL IJ SOAJ injection Inject into the muscle once.    . Ergocalciferol (VITAMIN D2) 400 units TABS Take by mouth.    . Ibuprofen (ADVIL PO) Take by mouth as needed.    Marland Kitchen levocetirizine (XYZAL) 5 MG tablet Take 5 mg by mouth 2 (two) times daily.    . montelukast (SINGULAIR) 10 MG tablet TAKE 1 TABLET BY MOUTH  ONCE A DAY AT BEDTIME. 30 tablet 3  . ranitidine (ZANTAC) 150 MG tablet Take 1 tablet (150 mg total) by mouth 2 (two) times daily. 60 tablet 5   No current facility-administered medications on file prior to visit.     ALLERGIES: Bactrim [sulfamethoxazole-trimethoprim]; Doryx [doxycycline]; Keflex [cephalexin]; and Alpha-gal  Family History  Adopted: Yes  Problem Relation Age of Onset  . Breast cancer Mother        Late 30's-40's, brain tumor  . Lupus Maternal Aunt     SH:  Married, non smoker  Review of Systems   Genitourinary: Positive for frequency and urgency.  All other systems reviewed and are negative.   PHYSICAL EXAMINATION:    BP 118/64 (BP Location: Right Arm, Patient Position: Sitting, Cuff Size: Large)   Pulse 76   Resp 16   Ht 5\' 7"  (1.702 m)   Wt 235 lb (106.6 kg)   LMP 11/10/2018   BMI 36.81 kg/m     General appearance: alert, cooperative and appears stated age  Abdomen: soft, non-tender; bowel sounds normal; no masses,  no organomegaly Lymph:  no inguinal LAD noted  Pelvic: External genitalia:  Appearance with external labia majora of thickened tissue c/w itching              Urethra:  normal appearing urethra with no masses, tenderness or lesions              Bartholins and Skenes: normal                 Vagina: normal appearing vagina with normal color and discharge, no lesions              Cervix: no lesions              Bimanual Exam:  Uterus:  normal size, contour, position, consistency, mobility, non-tender              Adnexa: no mass, fullness, tenderness  Chaperone was present for exam.  Assessment: Recurrent PMP bleeding with FSH of 39 Left ovarian cyst and right possible paratubal cyst Small possible intramural vs encroaching fibroid  Plan: We have discussed previously if this occurred again that hysteroscopy would be recommended.  Feel should repeat PUS to see if there is any change in either cyst on the ovaries to decide if any treatment is needed on either side.  Pt comfortable with plan and will return on Thursday for this.  Will plan vulvar biopsy in the OR as well.

## 2018-11-23 ENCOUNTER — Other Ambulatory Visit: Payer: Self-pay | Admitting: *Deleted

## 2018-11-23 ENCOUNTER — Ambulatory Visit (INDEPENDENT_AMBULATORY_CARE_PROVIDER_SITE_OTHER): Payer: BC Managed Care – PPO

## 2018-11-23 ENCOUNTER — Ambulatory Visit: Payer: BC Managed Care – PPO | Admitting: Obstetrics & Gynecology

## 2018-11-23 VITALS — BP 120/72 | HR 60 | Resp 16 | Ht 67.0 in | Wt 237.4 lb

## 2018-11-23 DIAGNOSIS — N950 Postmenopausal bleeding: Secondary | ICD-10-CM

## 2018-11-23 DIAGNOSIS — N83292 Other ovarian cyst, left side: Secondary | ICD-10-CM | POA: Diagnosis not present

## 2018-11-23 DIAGNOSIS — N83202 Unspecified ovarian cyst, left side: Secondary | ICD-10-CM

## 2018-11-23 NOTE — Progress Notes (Signed)
57 y.o. G21P1001 Married White or Caucasian female here for pelvic ultrasound due to PMP bleeding and to assess adnexa again for making a decision about surgical procedure.  She has a know ovarian cyst that is avascular as well as a paratubal cyst that is avascular.  Repeat PUS was planned for February but she is having it today.  Patient's last menstrual period was 11/10/2018.  Contraception: BTL and PMP  Findings:  UTERUS: 7.8 x 5.0 x 3.9 cm with 3 fibroids.  The largest measures 1.7 cm  EMS: 2.2 - 4.36mm, arcuate endometrium noted. ADNEXA: Left ovary: 2.3 x 1.3 x 0.8 cm with a 2.7 x 1.9 x 2.4 cm cyst.  This is avascular.  It previously measured 22 x 21 x 26 mm.       Right ovary: 2.0 x 1.6 x 1.6 cm with a paratubal cyst that has a complete septation between 2 cyst noted.  To cyst measure 2.2 x 1.7 cm cyst and 1.5 x 0.8 cm cyst.  This is avascular and unchanged in size  CUL DE SAC: No free fluid  Discussion: I see ovarian findings are all avascular stable, I feel this can be followed conservatively.  I did discuss with the patient hysteroscopy and D&C as well as laparoscopy with removal of adnexal structures as needed.  Postoperative risks and recovery is discussed.  Because there is minimal change in the ovarian findings, also feels appropriate follow-up with ultrasound.  She feels more comfortable with this.  Hysteroscopy with D&C and possible restriction of any endometrial finding will perform.  Procedure discussed with patient.  Hospital stay, recovery and pain management all discussed.  Risks discussed including but not limited to bleeding, 1% risk of receiving a  transfusion, infection, 3-4% risk of bowel/bladder/ureteral/vascular injury discussed as well as possible need for additional surgery if injury does occur discussed.  DVT/PE and rare risk of death discussed.  My actual complications with prior surgeries discussed.  Vaginal cuff dehiscence discussed.  Hernia formation discussed.   Positioning and incision locations discussed.  Patient aware if pathology abnormal she may need additional treatment.  All questions answered.    Assessment: Recurrent postmenopausal bleeding Left 3 cm simple cyst Right paratubal cysts  Plan: Hysteroscopy with D&C and possible resection of endometrial lesion will be planned  ~15 minutes spent with patient >50% of time was in face to face discussion of above.

## 2018-11-24 ENCOUNTER — Telehealth: Payer: Self-pay | Admitting: *Deleted

## 2018-11-24 NOTE — Telephone Encounter (Signed)
Call to patient. Surgery information form reviewed in detail with patient and she verbalized understanding. Advised would mail a copy to patient with a brochure of [facility]. Patient agreeable. Address on file confirmed. 2 week post op scheduled for 12-19-18 at 1600. Patient agreeable to date and time of appointment.   Routing to provider and will close encounter.   Cc Lamont Snowball, RN

## 2018-11-24 NOTE — Telephone Encounter (Signed)
Call to patient to review surgery date options. Patient agreeable to 12-01-18.  Will schedule and call patient back.

## 2018-11-26 ENCOUNTER — Other Ambulatory Visit: Payer: Self-pay | Admitting: Obstetrics & Gynecology

## 2018-11-27 ENCOUNTER — Encounter (HOSPITAL_BASED_OUTPATIENT_CLINIC_OR_DEPARTMENT_OTHER): Payer: Self-pay | Admitting: *Deleted

## 2018-11-27 ENCOUNTER — Other Ambulatory Visit: Payer: Self-pay

## 2018-11-27 ENCOUNTER — Encounter: Payer: Self-pay | Admitting: Obstetrics & Gynecology

## 2018-11-27 NOTE — Progress Notes (Addendum)
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

## 2018-11-27 NOTE — Progress Notes (Signed)
Your procedure is scheduled on  12-01-18  Report to [place] am   Call this number if you have problems the morning of surgery  :[phone].   OUR ADDRESS IS [address].  WE ARE LOCATED IN THE [place]                                   [address].                                     REMEMBER:  DO NOT EAT FOOD OR DRINK LIQUIDS AFTER MIDNIGHT .  TAKE THESE MEDICATIONS MORNING OF SURGERY WITH A SIP OF WATER: RANITIDINE IF NEEDED. NO SOLID FOOD AFTER MIDNIGHT THE NIGHT PRIOR TO SURGERY. NOTHING BY MOUTH EXCEPT CLEAR LIQUIDS UNTIL 3 HOURS PRIOR TO [place] SURGERY. PLEASE FINISH ENSURE DRINK PER SURGEON ORDER 3 HOURS PRIOR TO SCHEDULED SURGERY TIME WHICH NEEDS TO BE COMPLETED AT 430 AM.    CLEAR LIQUID DIET   Foods Allowed                                                                     Foods Excluded  Coffee and tea, regular and decaf                             liquids that you cannot  Plain Jell-O in any flavor                                             see through such as: Fruit ices (not with fruit pulp)                                     milk, soups, orange juice  Iced Popsicles                                    All solid food Carbonated beverages, regular and diet                                    Cranberry, grape and apple juices Sports drinks like Gatorade Lightly seasoned clear broth or consume(fat free) Sugar, honey syrup  Sample Menu Breakfast                                Lunch                                     Supper Cranberry juice  Beef broth                            Chicken broth Jell-O                                     Grape juice                           Apple juice Coffee or tea                        Jell-O                                      Popsicle                                                Coffee or tea                        Coffee or  tea  _____________________________________________________________________                                      DO NOT WEAR JEWERLY, MAKE UP, OR NAIL POLISH,  DO NOT WEAR LOTIONS, POWDERS, PERFUMES OR DEODORANT. DO NOT SHAVE FOR 24 HOURS PRIOR TO DAY OF SURGERY. MEN MAY SHAVE FACE AND NECK. CONTACTS, GLASSES, OR DENTURES MAY NOT BE WORN TO SURGERY.                                    [facility] IS NOT RESPONSIBLE  FOR ANY BELONGINGS.                                                                    Marland Kitchen

## 2018-11-30 ENCOUNTER — Encounter (HOSPITAL_COMMUNITY)
Admission: RE | Admit: 2018-11-30 | Discharge: 2018-11-30 | Disposition: A | Discharge status: Home / Self Care | Payer: BC Managed Care – PPO | Source: Ambulatory Visit | Attending: Obstetrics & Gynecology | Admitting: Obstetrics & Gynecology

## 2018-11-30 DIAGNOSIS — Z01812 Encounter for preprocedural laboratory examination: Secondary | ICD-10-CM | POA: Diagnosis present

## 2018-11-30 LAB — BASIC METABOLIC PANEL
Anion gap: 6 (ref 5–15)
BUN: 12 mg/dL (ref 6–20)
CO2: 28 mmol/L (ref 22–32)
Calcium: 9 mg/dL (ref 8.9–10.3)
Chloride: 105 mmol/L (ref 98–111)
Creatinine, Ser: 0.66 mg/dL (ref 0.44–1.00)
GFR calc Af Amer: >60 mL/min (ref >60)
GFR calc non Af Amer: >60 mL/min (ref >60)
Glucose, Bld: 87 mg/dL (ref 70–99)
Potassium: 4 mmol/L (ref 3.5–5.1)
Sodium: 139 mmol/L (ref 135–145)

## 2018-11-30 LAB — CBC
HCT: 42.7 % (ref 36.0–46.0)
Hemoglobin: 13.5 g/dL (ref 12.0–15.0)
MCH: 27.6 pg (ref 26.0–34.0)
MCHC: 31.6 g/dL (ref 30.0–36.0)
MCV: 87.1 fL (ref 80.0–100.0)
NRBC: 0 % (ref 0.0–0.2)
Platelets: 354 10*3/uL (ref 150–400)
RBC: 4.9 MIL/uL (ref 3.87–5.11)
RDW: 14.4 % (ref 11.5–15.5)
WBC: 9.6 10*3/uL (ref 4.0–10.5)

## 2018-12-01 ENCOUNTER — Encounter (HOSPITAL_BASED_OUTPATIENT_CLINIC_OR_DEPARTMENT_OTHER): Payer: Self-pay | Admitting: *Deleted

## 2018-12-01 ENCOUNTER — Ambulatory Visit (HOSPITAL_BASED_OUTPATIENT_CLINIC_OR_DEPARTMENT_OTHER)
Admission: RE | Admit: 2018-12-01 | Discharge: 2018-12-01 | Disposition: A | Discharge status: Home / Self Care | Payer: BC Managed Care – PPO | Attending: Obstetrics & Gynecology | Admitting: Obstetrics & Gynecology

## 2018-12-01 ENCOUNTER — Ambulatory Visit (HOSPITAL_BASED_OUTPATIENT_CLINIC_OR_DEPARTMENT_OTHER): Payer: BC Managed Care – PPO | Admitting: Anesthesiology

## 2018-12-01 ENCOUNTER — Encounter (HOSPITAL_BASED_OUTPATIENT_CLINIC_OR_DEPARTMENT_OTHER)
Admission: RE | Disposition: A | Discharge status: Home / Self Care | Payer: Self-pay | Source: Home / Self Care | Attending: Obstetrics & Gynecology

## 2018-12-01 ENCOUNTER — Other Ambulatory Visit: Payer: Self-pay

## 2018-12-01 DIAGNOSIS — H9190 Unspecified hearing loss, unspecified ear: Secondary | ICD-10-CM | POA: Insufficient documentation

## 2018-12-01 DIAGNOSIS — N840 Polyp of corpus uteri: Secondary | ICD-10-CM | POA: Diagnosis not present

## 2018-12-01 DIAGNOSIS — N950 Postmenopausal bleeding: Secondary | ICD-10-CM

## 2018-12-01 DIAGNOSIS — K219 Gastro-esophageal reflux disease without esophagitis: Secondary | ICD-10-CM | POA: Diagnosis not present

## 2018-12-01 DIAGNOSIS — Z6836 Body mass index (BMI) 36.0-36.9, adult: Secondary | ICD-10-CM | POA: Insufficient documentation

## 2018-12-01 DIAGNOSIS — N9089 Other specified noninflammatory disorders of vulva and perineum: Secondary | ICD-10-CM

## 2018-12-01 DIAGNOSIS — Z79899 Other long term (current) drug therapy: Secondary | ICD-10-CM | POA: Diagnosis not present

## 2018-12-01 DIAGNOSIS — Z9851 Tubal ligation status: Secondary | ICD-10-CM | POA: Diagnosis not present

## 2018-12-01 DIAGNOSIS — N898 Other specified noninflammatory disorders of vagina: Secondary | ICD-10-CM | POA: Diagnosis not present

## 2018-12-01 DIAGNOSIS — Z87891 Personal history of nicotine dependence: Secondary | ICD-10-CM | POA: Insufficient documentation

## 2018-12-01 DIAGNOSIS — N841 Polyp of cervix uteri: Secondary | ICD-10-CM | POA: Insufficient documentation

## 2018-12-01 DIAGNOSIS — J302 Other seasonal allergic rhinitis: Secondary | ICD-10-CM | POA: Diagnosis not present

## 2018-12-01 HISTORY — DX: Other complications of anesthesia, initial encounter: T88.59XA

## 2018-12-01 HISTORY — PX: DILATATION & CURETTAGE/HYSTEROSCOPY WITH MYOSURE: SHX6511

## 2018-12-01 HISTORY — DX: Adverse effect of unspecified anesthetic, initial encounter: T41.45XA

## 2018-12-01 HISTORY — DX: Cardiac murmur, unspecified: R01.1

## 2018-12-01 HISTORY — DX: Prediabetes: R73.03

## 2018-12-01 LAB — GLUCOSE, CAPILLARY: Glucose-Capillary: 126 mg/dL — ABNORMAL HIGH (ref 70–99)

## 2018-12-01 SURGERY — DILATATION & CURETTAGE/HYSTEROSCOPY WITH MYOSURE
Anesthesia: General | Site: Uterus

## 2018-12-01 MED ORDER — PROPOFOL 10 MG/ML IV BOLUS
INTRAVENOUS | Status: DC | PRN
  Administered 2018-12-01: 200 mg via INTRAVENOUS

## 2018-12-01 MED ORDER — PROPOFOL 10 MG/ML IV BOLUS
INTRAVENOUS | Status: AC
  Filled 2018-12-01: qty 40

## 2018-12-01 MED ORDER — IBUPROFEN 800 MG PO TABS: 800.0000 mg | ORAL_TABLET | Freq: Three times a day (TID) | ORAL | 0 refills | Status: DC | PRN

## 2018-12-01 MED ORDER — LIDOCAINE-EPINEPHRINE 1 %-1:100000 IJ SOLN
INTRAMUSCULAR | Status: DC | PRN
  Administered 2018-12-01: 10 mL

## 2018-12-01 MED ORDER — ONDANSETRON HCL 4 MG/2ML IJ SOLN
INTRAMUSCULAR | Status: AC
  Filled 2018-12-01: qty 2

## 2018-12-01 MED ORDER — HYDROCODONE-ACETAMINOPHEN 5-325 MG PO TABS: 1.0000 | ORAL_TABLET | Freq: Four times a day (QID) | ORAL | 0 refills | Status: DC | PRN

## 2018-12-01 MED ORDER — FENTANYL CITRATE (PF) 100 MCG/2ML IJ SOLN
INTRAMUSCULAR | Status: AC
  Filled 2018-12-01: qty 2

## 2018-12-01 MED ORDER — LACTATED RINGERS IV SOLN
INTRAVENOUS | Status: DC
  Administered 2018-12-01 (×2): via INTRAVENOUS
  Filled 2018-12-01: qty 1000

## 2018-12-01 MED ORDER — DEXAMETHASONE SODIUM PHOSPHATE 10 MG/ML IJ SOLN
INTRAMUSCULAR | Status: AC
  Filled 2018-12-01: qty 1

## 2018-12-01 MED ORDER — KETOROLAC TROMETHAMINE 30 MG/ML IJ SOLN
30.0000 mg | Freq: Once | INTRAMUSCULAR | Status: DC | PRN
  Filled 2018-12-01: qty 1

## 2018-12-01 MED ORDER — MIDAZOLAM HCL 5 MG/5ML IJ SOLN
INTRAMUSCULAR | Status: DC | PRN
  Administered 2018-12-01: 2 mg via INTRAVENOUS

## 2018-12-01 MED ORDER — KETOROLAC TROMETHAMINE 30 MG/ML IJ SOLN
INTRAMUSCULAR | Status: DC | PRN
  Administered 2018-12-01: 30 mg via INTRAVENOUS

## 2018-12-01 MED ORDER — FENTANYL CITRATE (PF) 100 MCG/2ML IJ SOLN
INTRAMUSCULAR | Status: DC | PRN
  Administered 2018-12-01 (×5): 25 ug via INTRAVENOUS
  Administered 2018-12-01: 50 ug via INTRAVENOUS
  Administered 2018-12-01: 25 ug via INTRAVENOUS

## 2018-12-01 MED ORDER — PROMETHAZINE HCL 25 MG/ML IJ SOLN
6.2500 mg | INTRAMUSCULAR | Status: DC | PRN
  Filled 2018-12-01: qty 1

## 2018-12-01 MED ORDER — FENTANYL CITRATE (PF) 100 MCG/2ML IJ SOLN
25.0000 ug | INTRAMUSCULAR | Status: DC | PRN
  Filled 2018-12-01: qty 1

## 2018-12-01 MED ORDER — LIDOCAINE 2% (20 MG/ML) 5 ML SYRINGE
INTRAMUSCULAR | Status: AC
  Filled 2018-12-01: qty 5

## 2018-12-01 MED ORDER — ONDANSETRON HCL 4 MG/2ML IJ SOLN
INTRAMUSCULAR | Status: DC | PRN
  Administered 2018-12-01: 4 mg via INTRAVENOUS

## 2018-12-01 MED ORDER — SODIUM CHLORIDE 0.9 % IR SOLN
Status: DC | PRN
  Administered 2018-12-01: 500 mL

## 2018-12-01 MED ORDER — DEXAMETHASONE SODIUM PHOSPHATE 4 MG/ML IJ SOLN
INTRAMUSCULAR | Status: DC | PRN
  Administered 2018-12-01: 10 mg via INTRAVENOUS

## 2018-12-01 MED ORDER — KETOROLAC TROMETHAMINE 30 MG/ML IJ SOLN
INTRAMUSCULAR | Status: AC
  Filled 2018-12-01: qty 1

## 2018-12-01 MED ORDER — DIAZEPAM 5 MG PO TABS: 5.0000 mg | ORAL_TABLET | Freq: Three times a day (TID) | ORAL | 0 refills | Status: DC | PRN

## 2018-12-01 MED ORDER — LIDOCAINE HCL (CARDIAC) PF 100 MG/5ML IV SOSY
PREFILLED_SYRINGE | INTRAVENOUS | Status: DC | PRN
  Administered 2018-12-01: 80 mg via INTRAVENOUS

## 2018-12-01 MED ORDER — MIDAZOLAM HCL 2 MG/2ML IJ SOLN
INTRAMUSCULAR | Status: AC
  Filled 2018-12-01: qty 2

## 2018-12-01 SURGICAL SUPPLY — 33 items
BIPOLAR CUTTING LOOP 21FR (ELECTRODE)
CANISTER SUCT 3000ML PPV (MISCELLANEOUS) ×2 IMPLANT
CATH ROBINSON RED A/P 16FR (CATHETERS) ×3 IMPLANT
COVER WAND RF STERILE (DRAPES) ×3 IMPLANT
DEVICE MYOSURE LITE (MISCELLANEOUS) ×2 IMPLANT
DEVICE MYOSURE REACH (MISCELLANEOUS) IMPLANT
DILATOR CANAL MILEX (MISCELLANEOUS) IMPLANT
ELECT BALL LEEP 5MM RED (ELECTRODE) ×2 IMPLANT
ELECT LOOP LEEP RND 15X12 GRN (CUTTING LOOP) ×3
ELECTRODE LOOP LP RND 15X12GRN (CUTTING LOOP) IMPLANT
GAUZE 4X4 16PLY RFD (DISPOSABLE) ×3 IMPLANT
GLOVE BIO SURGEON STRL SZ 6.5 (GLOVE) ×1 IMPLANT
GLOVE BIO SURGEONS STRL SZ 6.5 (GLOVE) ×1
GLOVE BIOGEL PI IND STRL 6.5 (GLOVE) IMPLANT
GLOVE BIOGEL PI IND STRL 7.0 (GLOVE) ×1 IMPLANT
GLOVE BIOGEL PI IND STRL 7.5 (GLOVE) IMPLANT
GLOVE BIOGEL PI INDICATOR 6.5 (GLOVE) ×2
GLOVE BIOGEL PI INDICATOR 7.0 (GLOVE) ×2
GLOVE BIOGEL PI INDICATOR 7.5 (GLOVE) ×2
GLOVE ECLIPSE 6.5 STRL STRAW (GLOVE) ×6 IMPLANT
GOWN STRL REUS W/ TWL LRG LVL3 (GOWN DISPOSABLE) ×1 IMPLANT
GOWN STRL REUS W/ TWL XL LVL3 (GOWN DISPOSABLE) ×1 IMPLANT
GOWN STRL REUS W/TWL LRG LVL3 (GOWN DISPOSABLE) ×3
GOWN STRL REUS W/TWL XL LVL3 (GOWN DISPOSABLE) ×3
IV NS IRRIG 3000ML ARTHROMATIC (IV SOLUTION) ×4 IMPLANT
KIT PROCEDURE FLUENT (KITS) ×3 IMPLANT
KIT TURNOVER CYSTO (KITS) ×3 IMPLANT
LOOP CUTTING BIPOLAR 21FR (ELECTRODE) IMPLANT
PACK VAGINAL MINOR WOMEN LF (CUSTOM PROCEDURE TRAY) ×3 IMPLANT
PAD OB MATERNITY 4.3X12.25 (PERSONAL CARE ITEMS) ×3 IMPLANT
SEAL ROD LENS SCOPE MYOSURE (ABLATOR) ×3 IMPLANT
TOWEL OR 17X24 6PK STRL BLUE (TOWEL DISPOSABLE) ×6 IMPLANT
WATER STERILE IRR 500ML POUR (IV SOLUTION) ×1 IMPLANT

## 2018-12-01 NOTE — Discharge Instructions (Signed)

## 2018-12-01 NOTE — Transfer of Care (Signed)
Immediate Anesthesia Transfer of Care Note  Patient: Brandi Farley  Procedure(s) Performed: Procedure(s) (LRB): DILATATION & CURETTAGE/HYSTEROSCOPY WITH MYOSURE: BIOPSY OF LABIAL MAJORA (N/A)  Patient Location: PACU  Anesthesia Type: General  Level of Consciousness: awake, sedated, patient cooperative and responds to stimulation  Airway & Oxygen Therapy: Patient Spontanous Breathing and Patient connected to [place] oxygen  Post-op Assessment: Report given to PACU RN, Post -op Vital signs reviewed and stable and Patient moving all extremities  Post vital signs: Reviewed and stable  Complications: No apparent anesthesia complications

## 2018-12-01 NOTE — Op Note (Signed)
12/01/2018  8:17 AM  PATIENT:  Brandi Farley  57 y.o. female  PRE-OPERATIVE DIAGNOSIS:  PMB, possible endometrial polyp  POST-OPERATIVE DIAGNOSIS:  PMB, possible endometrial polyp  PROCEDURE:  Procedure(s): DILATATION & CURETTAGE/HYSTEROSCOPY WITH MYOSURE: BIOPSY OF LABIAL MAJORA, CERVICAL BIOPSY  SURGEON:  Megan Salon  ASSISTANTS: OR staff   ANESTHESIA:   general  ESTIMATED BLOOD LOSS: 5 mL  BLOOD ADMINISTERED:none   FLUIDS: 1000cc LR  UOP: 50cc clear UOP drained with I&O cath at beginning of procedure  SPECIMEN:  endometrial curetting and polyps, cervical lesion, vulvar biopsy  DISPOSITION OF SPECIMEN:  PATHOLOGY  FINDINGS: four endometrial polyps, cervical lesion that appears to be a nabothian cyst but is very polypoid in nature, erythematous and thickened vulvar tissue c/w itching  DESCRIPTION OF OPERATION: Patient was taken to the operating room.  She is placed in the supine position. SCDs were on her lower extremities and functioning properly. General anesthesia with an LMA was administered without difficulty. Dr. Kalman Shan, anesthesia, oversaw case.  Legs were then placed in the [place] in the low lithotomy position. The legs were lifted to the high lithotomy position and the Betadine prep was used on the inner thighs perineum and vagina x3. Patient was draped in a normal standard fashion. An in and out catheterization with a red rubber Foley catheter was performed. Approximately 50 cc of clear urine was noted.  The vulvar tissue on the right was instilled with 1% Lidocaine with epi (1:100,000U).  Using pickups and a #15 blade, a small skin biopsy was obtained.  Hemostasis was achieved with the Bovie.  Then a bivalve speculum was placed the vagina. The anterior lip of the cervix was grasped with single-tooth tenaculum.  A paracervical block of 1% lidocaine mixed one-to-one with epinephrine (1:100,000 units).  10 cc was used total. The cervix is dilated up to #21 [facility]  dilators. The endometrial cavity sounded to 7 cm.   A Myosure hysteroscope was obtained. Normal saline was used as a hysteroscopic fluid. The hysteroscope was advanced through the endocervical canal into the endometrial cavity. The tubal ostia were noted bilaterally. Additional findings included thin endometrium but at least four endometrial polyps.  Using the Myosure lite device, the polyps were resected completely.  The The hysteroscope was removed. A #1 toothed curette was used to curette the cavity until rough gritty texture was noted in all quadrants. With revisualization of the hysteroscope, there was no longer any abnormal findings.  A second curetting was then performed.  Then the lesion on the cervix was excised with a 10 x 43mm Loop in one pass.  Cautery was used for excellent hemostasis.  At this point no other procedure was needed and this procedure was ended. The hysteroscope was removed. The fluid deficit was 120 cc NS. The tenaculum was removed from the anterior lip of the cervix. The speculum was removed from the vagina. The prep was cleansed of the patient's skin. The legs are positioned back in the supine position. Sponge, lap, needle, initially counts were correct x2. Patient was taken to recovery in stable condition.  COUNTS:  YES  PLAN OF CARE: Transfer to PACU

## 2018-12-01 NOTE — Anesthesia Postprocedure Evaluation (Signed)
Anesthesia Post Note  Patient: Brandi Farley  Procedure(s) Performed: DILATATION & CURETTAGE/HYSTEROSCOPY WITH MYOSURE: BIOPSY OF LABIAL MAJORA (N/A Uterus)     Patient location during evaluation: PACU Anesthesia Type: General Level of consciousness: awake and alert Pain management: pain level controlled Vital Signs Assessment: post-procedure vital signs reviewed and stable Respiratory status: spontaneous breathing, nonlabored ventilation, respiratory function stable and patient connected to nasal cannula oxygen Cardiovascular status: blood pressure returned to baseline and stable Postop Assessment: no apparent nausea or vomiting Anesthetic complications: no    Last Vitals:  Vitals:   12/01/18 0537 12/01/18 0807  BP: 132/88 111/77  Pulse: 69 80  Resp: 16 14  Temp: 36.4 C 36.6 C  SpO2: 95% 93%    Last Pain:  Vitals:   12/01/18 0807  TempSrc:   PainSc: 0-No pain                 Jerrol Helmers S

## 2018-12-01 NOTE — H&P (Signed)
Brandi Farley is an 57 y.o. female G1P1 MWF here for hysteroscopy with possible fibroid resection due to recurrent PMP bleeding with [facility] of 40 obtained in August.  She did have an endometrial biopsy in June that was negative for abnormal cells.  She had another episode of bleeding in late November.  Ultrasound showed endometrium to measure 2.l2 -4.44mm with possible encroachment of fibroid.  Due to recurrent bleeding, hysteroscopy with resection of possible endometrial mass recommended.  Risks and benefits have been reviewed.  She is here for this procedure today.  Pertinent Gynecological History: Menses: post-menopausal Bleeding: post menopausal bleeding Contraception: tubal ligation DES exposure: denies Blood transfusions: none Sexually transmitted diseases: no past history Previous GYN Procedures: BTL and D&C  Last mammogram: normal Date: 08/23/18 Last pap: normal Date: 05/24/18  Was ASCUS with neg HR HPV OB History: G1, P1   Menstrual History: Patient's last menstrual period was 11/10/2018.    Past Medical History:  Diagnosis Date  . Acid reflux   . Arthritis    severe in knees and back and hands  . Complication of anesthesia    slow to awaken at times  . Fibroid    Breast  . Hearing deficit   . Heart murmur   . Hormone disorder   . Lyme disease spring 2019  . Pre-diabetes   . Seasonal allergies     Past Surgical History:  Procedure Laterality Date  . APPENDECTOMY  age 63  . COCHLEAR IMPLANT Right 2016 [place]  . ESOPHAGOGASTRODUODENOSCOPY N/A 02/06/2014   Procedure: ESOPHAGOGASTRODUODENOSCOPY (EGD);  Surgeon: Rogene Houston, MD;  [facility];  Service: Endoscopy;  Laterality: N/A;  1200  . KNEE SURGERY Right 2018   meniscus repair  . TUBAL LIGATION  1999    Family History  Adopted: Yes  Problem Relation Age of Onset  . Breast cancer Mother        Late 30's-40's, brain tumor  . Lupus Maternal Aunt     Social History:  reports that she has quit  smoking. Her smoking use included cigarettes. She has a 50.00 pack-year smoking history. She has never used smokeless tobacco. She reports that she does not drink alcohol or use drugs.  Allergies:  Allergies  Allergen Reactions  . Bactrim [Sulfamethoxazole-Trimethoprim] Shortness Of Breath and Other (See Comments)    Cramping  . Doryx [Doxycycline] Shortness Of Breath  . Keflex [Cephalexin] Shortness Of Breath  . Alpha-Gal Hives    Can't do pork or beef or lamb, can handle items    Medications Prior to Admission  Medication Sig Dispense Refill Last Dose  . cetirizine (ZYRTEC) 10 MG tablet Take 10 mg by mouth at bedtime.   11/30/2018 at Unknown time  . Ergocalciferol (VITAMIN D2) 400 units TABS Take by mouth.   Past Month at Unknown time  . Ibuprofen (ADVIL PO) Take 200 mg by mouth as needed.    Past Month at Unknown time  . montelukast (SINGULAIR) 10 MG tablet TAKE 1 TABLET BY MOUTH ONCE A DAY AT BEDTIME. 30 tablet 3 11/30/2018 at Unknown time  . ranitidine (ZANTAC) 150 MG tablet Take 1 tablet (150 mg total) by mouth 2 (two) times daily. 60 tablet 5 12/01/2018 at 0430  . aspirin-acetaminophen-caffeine (EXCEDRIN MIGRAINE) 250-250-65 MG tablet Take by mouth every 6 (six) hours as needed for headache.   Unknown at Unknown time  . EPINEPHrine (EPIPEN 2-PAK) 0.3 mg/0.3 mL IJ SOAJ injection Inject into the muscle once.   never  Review of Systems  All other systems reviewed and are negative.   Blood pressure 132/88, pulse 69, temperature 97.6 F (36.4 C), temperature source Oral, resp. rate 16, height 5\' 7"  (1.702 m), weight 106.3 kg, last menstrual period 11/10/2018, SpO2 95 %. Physical Exam  Constitutional: She is oriented to person, place, and time. She appears well-developed and well-nourished.  Cardiovascular: Normal rate and regular rhythm.  Respiratory: Effort normal and breath sounds normal.  Neurological: She is alert and oriented to person, place, and time.  Skin: Skin is warm  and dry.  Psychiatric: She has a normal mood and affect.    Results for orders placed or performed during the hospital encounter of 12/01/18 (from the past 24 hour(s))  Glucose, capillary     Status: Abnormal   Collection Time: 12/01/18  6:26 AM  Result Value Ref Range   Glucose-Capillary 126 (H) 70 - 99 mg/dL    No results found.  Assessment/Plan: 57 yo MWF here for hysteroscopy with possible resection of endometrial mass.  Vulvar biopsy is also going to be performed today due to chronic vulvar itching.  Questions answered.  Pt ready to proceed.  Megan Salon 12/01/2018, 7:11 AM

## 2018-12-01 NOTE — Anesthesia Procedure Notes (Signed)
Procedure Name: LMA Insertion Date/Time: 12/01/2018 7:32 AM Performed by: Justice Rocher, CRNA Pre-anesthesia Checklist: Patient identified, Emergency Drugs available, Suction available and Patient being monitored Patient Re-evaluated:Patient Re-evaluated prior to induction Oxygen Delivery Method: Circle system utilized Preoxygenation: Pre-oxygenation with 100% oxygen Induction Type: IV induction Ventilation: Mask ventilation without difficulty LMA: LMA inserted LMA Size: 4.0 Number of attempts: 1 Airway Equipment and Method: Bite block Placement Confirmation: positive ETCO2 and breath sounds checked- equal and bilateral Tube secured with: Tape Dental Injury: Teeth and Oropharynx as per pre-operative assessment

## 2018-12-01 NOTE — Anesthesia Preprocedure Evaluation (Signed)
Anesthesia Evaluation  Patient identified by MRN, date of birth, ID band Patient awake    Reviewed: Allergy & Precautions, NPO status , Patient's Chart, lab work & pertinent test results  Airway Mallampati: II  TM Distance: >3 FB Neck ROM: Full    Dental no notable dental hx.    Pulmonary neg pulmonary ROS, former smoker,    Pulmonary exam normal breath sounds clear to auscultation       Cardiovascular negative cardio ROS Normal cardiovascular exam Rhythm:Regular Rate:Normal     Neuro/Psych negative neurological ROS  negative psych ROS   GI/Hepatic Neg liver ROS, GERD  ,  Endo/Other  diabetesMorbid obesity  Renal/GU negative Renal ROS  negative genitourinary   Musculoskeletal negative musculoskeletal ROS (+)   Abdominal   Peds negative pediatric ROS (+)  Hematology negative hematology ROS (+)   Anesthesia Other Findings   Reproductive/Obstetrics negative OB ROS                             Anesthesia Physical Anesthesia Plan  ASA: III  Anesthesia Plan: General   Post-op Pain Management:    Induction: Intravenous  PONV Risk Score and Plan: 3 and Ondansetron, Dexamethasone and Treatment may vary due to age or medical condition  Airway Management Planned: LMA  Additional Equipment:   Intra-op Plan:   Post-operative Plan: Extubation in OR  Informed Consent: I have reviewed the patients History and Physical, chart, labs and discussed the procedure including the risks, benefits and alternatives for the proposed anesthesia with the patient or authorized representative who has indicated his/her understanding and acceptance.   Dental advisory given  Plan Discussed with: CRNA and Surgeon  Anesthesia Plan Comments:         Anesthesia Quick Evaluation

## 2018-12-04 ENCOUNTER — Encounter (HOSPITAL_BASED_OUTPATIENT_CLINIC_OR_DEPARTMENT_OTHER): Payer: Self-pay | Admitting: Obstetrics & Gynecology

## 2018-12-05 ENCOUNTER — Other Ambulatory Visit: Payer: Self-pay | Admitting: *Deleted

## 2018-12-05 MED ORDER — CLOBETASOL PROPIONATE 0.05 % EX OINT: 1.0000 "application " | TOPICAL_OINTMENT | Freq: Two times a day (BID) | CUTANEOUS | 0 refills | Status: DC

## 2018-12-19 ENCOUNTER — Ambulatory Visit: Payer: BC Managed Care – PPO | Admitting: Obstetrics & Gynecology

## 2018-12-28 ENCOUNTER — Ambulatory Visit: Payer: Self-pay | Admitting: Obstetrics & Gynecology

## 2019-01-05 ENCOUNTER — Other Ambulatory Visit: Payer: Self-pay

## 2019-01-05 ENCOUNTER — Ambulatory Visit (INDEPENDENT_AMBULATORY_CARE_PROVIDER_SITE_OTHER): Payer: BC Managed Care – PPO | Admitting: Obstetrics & Gynecology

## 2019-01-05 ENCOUNTER — Encounter: Payer: Self-pay | Admitting: Obstetrics & Gynecology

## 2019-01-05 VITALS — BP 110/80 | HR 80 | Resp 16 | Ht 67.0 in | Wt 233.8 lb

## 2019-01-05 DIAGNOSIS — L292 Pruritus vulvae: Secondary | ICD-10-CM

## 2019-01-05 DIAGNOSIS — N838 Other noninflammatory disorders of ovary, fallopian tube and broad ligament: Secondary | ICD-10-CM | POA: Diagnosis not present

## 2019-01-05 DIAGNOSIS — N841 Polyp of cervix uteri: Secondary | ICD-10-CM | POA: Diagnosis not present

## 2019-01-05 DIAGNOSIS — N840 Polyp of corpus uteri: Secondary | ICD-10-CM

## 2019-01-05 DIAGNOSIS — N950 Postmenopausal bleeding: Secondary | ICD-10-CM | POA: Diagnosis not present

## 2019-01-05 MED ORDER — CLOBETASOL PROPIONATE 0.05 % EX OINT: TOPICAL_OINTMENT | CUTANEOUS | 1 refills | Status: DC

## 2019-01-05 NOTE — Progress Notes (Signed)
GYNECOLOGY  VISIT  CC:   Follow up after hysteroscopy  HPI: 58 y.o. G59P1001 Married White or Caucasian female here for follow up after hysteroscopy and vulvar biopsy.  The pathology all showed endometrial, endocervical and cervical polyps.  This was benign.  Pictures and pathology showed to pt.  Vulvar biopsy showed lichenoid changes.  She does not have any vulvar itching.  She stopped the steroid ointment at this point.  Importance of tapering reviewed with pt.   Denies vaginal bleeding or discharge.  Has no pain after surgery.  Feels really good.  GYNECOLOGIC HISTORY: No LMP recorded. Contraception: PMP Menopausal hormone therapy: none  Patient Active Problem List   Diagnosis Date Noted  . 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 Bilateral sensorineural hearing loss 11/24/2015  . RECTAL BLEEDING 03/08/2008  . DIABETES MELLITUS, TYPE II 12/24/2006  . MIGRAINE HEADACHE 12/24/2006  . CARPAL TUNNEL SYNDROME 12/24/2006  . ALLERGIC RHINITIS 12/24/2006  . GERD 12/24/2006  . IBS 12/24/2006  . ARTHRITIS 12/24/2006    Past Medical History:  Diagnosis Date  . Acid reflux   . Arthritis    severe in knees and back and hands  . Complication of anesthesia    slow to awaken at times  . Fibroid    Breast  . Hearing deficit   . Heart murmur   . Hormone disorder   . Lyme disease spring 2019  . Pre-diabetes   . Seasonal allergies     Past Surgical History:  Procedure Laterality Date  . APPENDECTOMY  age 61  . COCHLEAR IMPLANT Right 2016 [place]  . DILATATION & CURETTAGE/HYSTEROSCOPY WITH MYOSURE N/A 12/01/2018   Procedure: DILATATION & CURETTAGE/HYSTEROSCOPY WITH MYOSURE: BIOPSY OF LABIAL MAJORA;  Surgeon: Megan Salon, MD;  Location: [facility];  Service: Gynecology;  Laterality: N/A;  . ESOPHAGOGASTRODUODENOSCOPY N/A 02/06/2014   Procedure: ESOPHAGOGASTRODUODENOSCOPY (EGD);  Surgeon: Rogene Houston, MD;  [facility];  Service:  Endoscopy;  Laterality: N/A;  1200  . KNEE SURGERY Right 2018   meniscus repair  . TUBAL LIGATION  1999    MEDS:   Current Outpatient Medications on File Prior to Visit  Medication Sig Dispense Refill  . amoxicillin (AMOXIL) 500 MG capsule Take 1 capsule by mouth 2 (two) times daily.    Marland Kitchen aspirin-acetaminophen-caffeine (EXCEDRIN MIGRAINE) 250-250-65 MG tablet Take by mouth every 6 (six) hours as needed for headache.    . cetirizine (ZYRTEC) 10 MG tablet Take 10 mg by mouth at bedtime.    Marland Kitchen EPINEPHrine (EPIPEN 2-PAK) 0.3 mg/0.3 mL IJ SOAJ injection Inject into the muscle once.    . Ergocalciferol (VITAMIN D2) 400 units TABS Take by mouth.    . montelukast (SINGULAIR) 10 MG tablet TAKE 1 TABLET BY MOUTH ONCE A DAY AT BEDTIME. 30 tablet 3  . ranitidine (ZANTAC) 150 MG tablet Take 1 tablet (150 mg total) by mouth 2 (two) times daily. 60 tablet 5  . clobetasol ointment (TEMOVATE) 6.31 % Apply 1 application topically 2 (two) times daily. Pea size amount to vulva. (Patient not taking: Reported on 01/05/2019) 30 g 0   No current facility-administered medications on file prior to visit.     ALLERGIES: Bactrim [sulfamethoxazole-trimethoprim]; Doryx [doxycycline]; Keflex [cephalexin]; and Alpha-gal  Family History  Adopted: Yes  Problem Relation Age of Onset  . Breast cancer Mother        Late 30's-40's, brain tumor  . Lupus Maternal Aunt     SH:  Married,  non smoker  Review of Systems  All other systems reviewed and are negative.   PHYSICAL EXAMINATION:    BP 110/80 (BP Location: Right Arm, Patient Position: Sitting, Cuff Size: Large)   Pulse 80   Resp 16   Ht 5\' 7"  (1.702 m)   Wt 233 lb 12.8 oz (106.1 kg)   BMI 36.62 kg/m     General appearance: alert, cooperative and appears stated age Lymph:  no inguinal LAD noted  Pelvic: External genitalia:  no lesions, decreased thickness of skin on external labia majora.  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  May increase to bid x 7 days for symptoms.  Rx to pharmacy today for RF. Return for PUS 02/2019 and AEX 05/2019   ~15 minutes spent with patient >50% of time was in face to face discussion of above.

## 2019-01-11 ENCOUNTER — Ambulatory Visit: Payer: Self-pay | Admitting: Obstetrics & Gynecology

## 2019-01-15 ENCOUNTER — Other Ambulatory Visit: Payer: Self-pay | Admitting: Allergy & Immunology

## 2019-02-07 ENCOUNTER — Encounter: Payer: Self-pay | Admitting: Allergy & Immunology

## 2019-02-14 ENCOUNTER — Ambulatory Visit: Payer: BC Managed Care – PPO | Admitting: Allergy & Immunology

## 2019-02-14 ENCOUNTER — Encounter: Payer: Self-pay | Admitting: Allergy & Immunology

## 2019-02-14 VITALS — BP 116/86 | HR 66 | Resp 16 | Ht 67.0 in | Wt 228.8 lb

## 2019-02-14 DIAGNOSIS — J3089 Other allergic rhinitis: Secondary | ICD-10-CM

## 2019-02-14 DIAGNOSIS — T7800XD Anaphylactic reaction due to unspecified food, subsequent encounter: Secondary | ICD-10-CM | POA: Diagnosis not present

## 2019-02-14 DIAGNOSIS — J302 Other seasonal allergic rhinitis: Secondary | ICD-10-CM

## 2019-02-14 NOTE — Patient Instructions (Addendum)
1. Alpha gal sensitivity - EpiPen is up to date. - We will get repeat alpha gal testing today.  - We will call you in 1-2 weeks with the results of the testing.   2. Perennial and seasonal allergic rhinitis (grasses, weeds, trees, cats, dogs, indoor molds, outdoor molds) - We will not make any medication changes at this time.  - Continue with: Xyzal (levocetirizine) 5mg  twice daily as needed and Flonase (fluticasone) one spray per nostril daily as needed - You can use an extra dose of the antihistamine, if needed, for breakthrough symptoms.  - Consider nasal saline rinses 1-2 times daily to remove allergens from the nasal cavities as well as help with mucous clearance (this is especially helpful to do before the nasal sprays are given)  3. Return in about 1 year (around 02/15/2020).   Please inform us of any Emergency Department visits, hospitalizations, or changes in symptoms. Call us before going to the ED for breathing or allergy symptoms since we might be able to fit you in for a sick visit. Feel free to contact us anytime with any questions, problems, or concerns.  It was a pleasure to see you again today!  Websites that have reliable patient information: 1. American Academy of Asthma, Allergy, and Immunology: www.aaaai.org 2. Food Allergy Research and Education (FARE): foodallergy.org 3. Mothers of Asthmatics: http://www.asthmacommunitynetwork.org 4. American College of Allergy, Asthma, and Immunology: MonthlyElectricBill.co.uk   Make sure you are registered to vote! If you have moved or changed any of your contact information, you will need to get this updated before voting!    Voter ID laws are NOT going into effect for the General Election in November 2020! DO NOT let this stop you from exercising your right to vote!

## 2019-02-14 NOTE — Progress Notes (Signed)
FOLLOW UP  Date of Service/Encounter:  02/14/19   Assessment:   Chronicidiopathicurticaria - with alpha gal sensitivity  Seasonal and perennial allergic rhinitis(grasses, weeds, trees, cats, dogs, indoor molds, outdoor molds)   Since I saw [name], we have been able to decrease her exposure to medications.  She stopped the Singulair completely.  She remains on the Xyzal and the Flonase, but she is using them on an as needed basis.  She continues to avoid red meat, which was clearly a trigger of her chronic urticaria.  We are going to get a repeat alpha gal panel to see whether we can safely reintroduce mammalian meat back into her diet.  She is very eager to do this.  Her allergic rhinitis seems well controlled with the current regimen.  She has not required any antibiotics for sinusitis.  She is going to be using her medications more regularly since this is her worst season coming up.   Plan/Recommendations:   1. Alpha gal sensitivity - EpiPen is up to date. - We will get repeat alpha gal testing today.  - We will call you in 1-2 weeks with the results of the testing.   2. Perennial and seasonal allergic rhinitis (grasses, weeds, trees, cats, dogs, indoor molds, outdoor molds) - We will not make any medication changes at this time.  - Continue with: Xyzal (levocetirizine) 5mg  twice daily as needed and Flonase (fluticasone) one spray per nostril daily as needed - You can use an extra dose of the antihistamine, if needed, for breakthrough symptoms.  - Consider nasal saline rinses 1-2 times daily to remove allergens from the nasal cavities as well as help with mucous clearance (this is especially helpful to do before the nasal sprays are given)  3. Return in about 1 year (around 02/15/2020).  Subjective:   Brandi Farley is a 58 y.o. female presenting today for follow up of  Chief Complaint  Patient presents with  . Allergic Rhinitis   . Urticaria    Brandi Farley  has a history of the following: Patient Active Problem List   Diagnosis Date Noted  . 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 Heart murmur 04/19/2018  . Bilateral sensorineural hearing loss 11/24/2015  . DIABETES MELLITUS, TYPE II 12/24/2006  . MIGRAINE HEADACHE 12/24/2006  . CARPAL TUNNEL SYNDROME 12/24/2006  . ALLERGIC RHINITIS 12/24/2006  . GERD 12/24/2006  . IBS 12/24/2006  . ARTHRITIS 12/24/2006    History obtained from: chart review and patient.  Brandi Farley is a 58 y.o. female presenting for a follow up visit. She was last seen in June 2019. At that time, we encouraged continued avoidance of red meats. For his allergic rhinitis, we recommended stopping the Singulair to see if we could simplify her regimen at bit. We continued with Xyzal as well as fluticasone as needed. We did discuss allergy shots as a means of long-term control.   Allergic Rhinitis Symptom History: She has done fairly well from an allergic rhinitis perspective. She recently had a deep cleaning of her carpeting and she had a lot of hair removed from the carpeting. She continues to have one dog and one cat. She is able to hand two animals, but just no more than that. She will use her fluticasone 1-2 times per week, otherwise more often she has nosebleeds.  She has not needed antibiotics at all since last visit.  Food Allergy Symptom History: She continues to avoid all red meat. She has had no accidental  exposures to red meat. She did eat a piece of bacon and did not react (this was not accidental). She does get some upset stomach when she is exposed to mammalian meat. The only thing that seems to help is the prescription Zantac. This has happened on 2-3 occasions. EpiPen is up to date.   She did undergo D&C since last visit due to a history of uterine polyps.  She was also having recurrent urinary tract infections. The D&C seems to have cleared up her recurrent UTIs.   Otherwise, there have been no changes to  her past medical history, surgical history, family history, or social history.    Review of Systems  Constitutional: Negative.  Negative for fever, malaise/fatigue and weight loss.  HENT: Positive for hearing loss. Negative for congestion, ear discharge and ear pain.        Positive for cochlear implant.  Eyes: Negative for pain, discharge and redness.  Respiratory: Negative for cough, sputum production, shortness of breath and wheezing.   Cardiovascular: Negative.  Negative for chest pain and palpitations.  Gastrointestinal: Negative for abdominal pain and heartburn.  Skin: Negative.  Negative for itching and rash.  Neurological: Negative for dizziness and headaches.  Endo/Heme/Allergies: Negative for environmental allergies. Does not bruise/bleed easily.       Objective:   Blood pressure 116/86, pulse 66, resp. rate 16, height 5\' 7"  (1.702 m), weight 228 lb 12.8 oz (103.8 kg), SpO2 96 %. Body mass index is 35.84 kg/m.   Physical Exam:  Physical Exam  Constitutional: She appears well-developed.  HENT:  Head: Normocephalic and atraumatic.  Right Ear: Tympanic membrane, external ear and ear canal normal.  Left Ear: Tympanic membrane and ear canal normal.  Nose: No mucosal edema, rhinorrhea, nasal deformity or septal deviation. No epistaxis. Right sinus exhibits no maxillary sinus tenderness and no frontal sinus tenderness. Left sinus exhibits no maxillary sinus tenderness and no frontal sinus tenderness.  Mouth/Throat: Uvula is midline and oropharynx is clear and moist. Mucous membranes are not pale and not dry.  Cochlear implant in place on the right.  Eyes: Pupils are equal, round, and reactive to light. Conjunctivae and EOM are normal. Right eye exhibits no chemosis and no discharge. Left eye exhibits no chemosis and no discharge. Right conjunctiva is not injected. Left conjunctiva is not injected.  Cardiovascular: Normal rate, regular rhythm and normal heart sounds.    Respiratory: Effort normal and breath sounds normal. No accessory muscle usage. No tachypnea. No respiratory distress. She has no wheezes. She has no rhonchi. She has no rales. She exhibits no tenderness.  Lymphadenopathy:    She has no cervical adenopathy.  Neurological: She is alert.  Skin: No abrasion, no petechiae and no rash noted. Rash is not papular, not vesicular and not urticarial. No erythema. No pallor.  Psychiatric: She has a normal mood and affect.     Diagnostic studies: labs sent instead    Salvatore Marvel, MD  [facility]

## 2019-02-19 LAB — ALPHA-GAL PANEL
Alpha Gal IgE*: 1.75 kU/L — ABNORMAL HIGH (ref <0.10)
Beef (Bos spp) IgE: 0.65 kU/L — ABNORMAL HIGH (ref <0.35)
Class Interpretation: 1
Lamb/Mutton (Ovis spp) IgE: 0.1 kU/L (ref <0.35)
Pork (Sus spp) IgE: 0.21 kU/L (ref <0.35)

## 2019-02-20 ENCOUNTER — Ambulatory Visit: Payer: BC Managed Care – PPO | Admitting: Allergy & Immunology

## 2019-02-20 ENCOUNTER — Encounter: Payer: Self-pay | Admitting: Allergy & Immunology

## 2019-03-01 ENCOUNTER — Ambulatory Visit: Payer: BC Managed Care – PPO | Admitting: Obstetrics & Gynecology

## 2019-03-01 ENCOUNTER — Other Ambulatory Visit: Payer: Self-pay

## 2019-03-01 ENCOUNTER — Other Ambulatory Visit (INDEPENDENT_AMBULATORY_CARE_PROVIDER_SITE_OTHER): Payer: BC Managed Care – PPO

## 2019-03-01 VITALS — BP 110/70 | HR 64 | Resp 16 | Ht 67.0 in | Wt 230.0 lb

## 2019-03-01 DIAGNOSIS — N838 Other noninflammatory disorders of ovary, fallopian tube and broad ligament: Secondary | ICD-10-CM

## 2019-03-01 DIAGNOSIS — N83202 Unspecified ovarian cyst, left side: Secondary | ICD-10-CM | POA: Diagnosis not present

## 2019-03-01 DIAGNOSIS — D251 Intramural leiomyoma of uterus: Secondary | ICD-10-CM | POA: Diagnosis not present

## 2019-03-01 DIAGNOSIS — N83201 Unspecified ovarian cyst, right side: Secondary | ICD-10-CM | POA: Diagnosis not present

## 2019-03-01 DIAGNOSIS — N950 Postmenopausal bleeding: Secondary | ICD-10-CM | POA: Diagnosis not present

## 2019-03-01 NOTE — Progress Notes (Signed)
58 y.o. G78P1001 Married White or Caucasian female here for pelvic ultrasound due to bilateral ovarian cysts.  She denies vaginal bleeding or pelvic pain.  Patient's last menstrual period was 11/10/2018.  Contraception: BTL  Findings:  UTERUS: 7.3 x 5.0 x 3.5cm with multiple small fibroids.  Largest is 1.7cm. EMS: 1.36mm ADNEXA: Left ovary: 3.5 x 1.8 x 1.5cm with 3.0 x 2.0 x 2.8cm avascular cyst that is slightly larger in size today       Right ovary: 3.5 x 3.0 x 1.38mm with 2.2cm and 1.1cm with a small 45mm cyst between these two.  These are all avascular and unchanged in size.  The small 72mm cyst was not previously noted but images are clearer today CUL DE SAC: no free fluid  Discussion:  Findings reviewed and images reviewed with pt.  As left ovarian cyst is slightly larger in size, will plan to repeat in six months.  This is avascular and there is not solid area so feel this is safe to monitor.  Assessment:  Bilateral ovarian cysts with left just mildly increased in size Fibroid uterus  Plan:  Repeat PUS in 6 months.  ~15 minutes spent with patient >50% of time was in face to face discussion of above.

## 2019-03-04 ENCOUNTER — Encounter: Payer: Self-pay | Admitting: Obstetrics & Gynecology

## 2019-03-04 DIAGNOSIS — N83202 Unspecified ovarian cyst, left side: Principal | ICD-10-CM

## 2019-03-04 DIAGNOSIS — D251 Intramural leiomyoma of uterus: Secondary | ICD-10-CM | POA: Insufficient documentation

## 2019-03-04 DIAGNOSIS — N83201 Unspecified ovarian cyst, right side: Secondary | ICD-10-CM | POA: Insufficient documentation

## 2019-03-05 ENCOUNTER — Other Ambulatory Visit: Payer: Self-pay | Admitting: Allergy & Immunology

## 2019-03-06 ENCOUNTER — Telehealth: Payer: Self-pay | Admitting: Obstetrics & Gynecology

## 2019-03-06 ENCOUNTER — Other Ambulatory Visit: Payer: Self-pay | Admitting: Allergy & Immunology

## 2019-03-06 NOTE — Telephone Encounter (Signed)
Call placed to schedule an ultrasound, which will be a six month follow up to be scheduled in September 2020. Left voicemail message requesting a return call

## 2019-03-06 NOTE — Telephone Encounter (Signed)
Called pt. States that there was miscommunication with the pharmacy and there is noting we need to do on our end. Requested call back with any future concerns.

## 2019-03-06 NOTE — Telephone Encounter (Signed)
Patient states pharmacy said they never received the prescription for Montelukast. It shows they received it, but she states they did not. She would like it resent.

## 2019-04-10 ENCOUNTER — Other Ambulatory Visit: Payer: Self-pay | Admitting: Allergy & Immunology

## 2019-05-28 NOTE — Progress Notes (Signed)
58 y.o. G80P1001 Married  Caucasian Fe here for annual exam.  Menopausal no HRT. Denies vaginal bleeding. Hot flashes are still occurring. Coping well with the viral issues. Working at home which is helping with less exposure for family. Daughter is immune compromised. Trying to work with weight loss. No health issues today.  Patient's last menstrual period was 11/10/2018.          Sexually active: Yes.    The current method of family planning is tubal ligation.    Exercising: No.  walking Smoker:  no  Review of Systems  Constitutional: Negative.   HENT: Negative.   Eyes: Negative.   Respiratory: Negative.   Cardiovascular: Negative.   Gastrointestinal: Negative.   Genitourinary: Negative.   Musculoskeletal: Negative.   Skin: Negative.   Neurological: Negative.   Endo/Heme/Allergies: Negative.   Psychiatric/Behavioral: Negative.     Health Maintenance: Pap:  03-02-17 neg HPV HR neg, 05-24-18 ASCUS HPV HR neg History of Abnormal Pap: no MMG:  08-23-18 category b density birads 1:neg Self Breast exams: yes Colonoscopy:  2011 neg f/u 31yrs BMD:   Long time ago TDaP:  2015 Shingles: 2020 had first part Pneumonia: not done Hep C and HIV: done  In the 90's Labs: PCP   reports that she has quit smoking. Her smoking use included cigarettes. She has a 50.00 pack-year smoking history. She has never used smokeless tobacco. She reports that she does not drink alcohol or use drugs.  Past Medical History:  Diagnosis Date  . Acid reflux   . Arthritis    severe in knees and back and hands  . Complication of anesthesia    slow to awaken at times  . Fibroid    Breast  . Hearing deficit   . Heart murmur   . Hormone disorder   . Lyme disease spring 2019  . Pre-diabetes   . Seasonal allergies     Past Surgical History:  Procedure Laterality Date  . APPENDECTOMY  age 4  . COCHLEAR IMPLANT Right 2016 [facility]  . DILATATION & CURETTAGE/HYSTEROSCOPY WITH MYOSURE N/A 12/01/2018    Procedure: DILATATION & CURETTAGE/HYSTEROSCOPY WITH MYOSURE: BIOPSY OF LABIAL MAJORA;  Surgeon: Megan Salon, MD;  Location: [facility];  Service: Gynecology;  Laterality: N/A;  . ESOPHAGOGASTRODUODENOSCOPY N/A 02/06/2014   Procedure: ESOPHAGOGASTRODUODENOSCOPY (EGD);  Surgeon: Rogene Houston, MD;  [facility];  Service: Endoscopy;  Laterality: N/A;  1200  . KNEE SURGERY Right 2018   meniscus repair  . TUBAL LIGATION  1999    Current Outpatient Medications  Medication Sig Dispense Refill  . aspirin-acetaminophen-caffeine (EXCEDRIN MIGRAINE) 250-250-65 MG tablet Take by mouth every 6 (six) hours as needed for headache.    . cetirizine (ZYRTEC) 10 MG tablet Take 10 mg by mouth at bedtime.    . clobetasol ointment (TEMOVATE) 0.05 % Apply topically nightly for the next six weeks. Then decrease to nightly, two nights a week for another two weeks.  Than stop.  With a flare, you can increase to twice daily for up to 7 days. 60 g 1  . Ergocalciferol (VITAMIN D2) 400 units TABS Take by mouth.    . montelukast (SINGULAIR) 10 MG tablet TAKE 1 TABLET BY MOUTH ONCE A DAY AT BEDTIME. 30 tablet 4  . ranitidine (ZANTAC) 150 MG tablet Take 1 tablet (150 mg total) by mouth 2 (two) times daily. 60 tablet 5  . EPINEPHrine (EPIPEN 2-PAK) 0.3 mg/0.3 mL IJ SOAJ injection Inject into  the muscle once.     No current facility-administered medications for this visit.     Family History  Adopted: Yes  Problem Relation Age of Onset  . Breast cancer Mother        Late 30's-40's, brain tumor  . Lupus Maternal Aunt     ROS:  Pertinent items are noted in HPI.  Otherwise, a comprehensive ROS was negative.  Exam:   BP 110/70   Pulse 68   Temp 97.9 F (36.6 C) (Skin)   Resp 16   Ht 5' 7.25" (1.708 m)   Wt 233 lb (105.7 kg)   LMP 11/10/2018   BMI 36.22 kg/m  Height: 5' 7.25" (170.8 cm) Ht Readings from Last 3 Encounters:  05/29/19 5' 7.25" (1.708 m)  03/01/19 5\' 7"  (1.702 m)   02/14/19 5\' 7"  (1.702 m)    General appearance: alert, cooperative and appears stated age Head: Normocephalic, without obvious abnormality, atraumatic Neck: no adenopathy, supple, symmetrical, trachea midline and thyroid normal to inspection and palpation Lungs: clear to auscultation bilaterally Breasts: normal appearance, no masses or tenderness, No nipple retraction or dimpling, No nipple discharge or bleeding, No axillary or supraclavicular adenopathy Heart: regular rate and rhythm Abdomen: soft, non-tender; no masses,  no organomegaly Extremities: extremities normal, atraumatic, no cyanosis or edema Skin: Skin color, texture, turgor normal. No rashes or lesions Lymph nodes: Cervical, supraclavicular, and axillary nodes normal. No abnormal inguinal nodes palpated Neurologic: Grossly normal   Pelvic: External genitalia:  no lesions, normal female              Urethra:  normal appearing urethra with no masses, tenderness or lesions              Bartholin's and Skene's: normal                 Vagina: normal appearing vagina with normal color and discharge, no lesions              Cervix: multiparous appearance, no cervical motion tenderness and no lesions              Pap taken: Yes.   Bimanual Exam:  Uterus:  normal size, contour, position, consistency, mobility, non-tender              Adnexa: normal adnexa and no mass, fullness, tenderness               Rectovaginal: Confirms               Anus:  normal sphincter tone, no lesions  Chaperone present: yes  A:  Well Woman with normal exam  Menopausal no HRT  History of ASCUS pap negative HPVHR  History of fibroid  PCP management of allergies, GERD  Endocrine management of Diabetes   Hearing impaired Cochlear implant right  P:   Reviewed health and wellness pertinent to exam  Aware of need to advise if vaginal bleeding  Repeat pap smear today  Continue follow up with MD's regarding her health issues  as indicated  Pap  smear: yes   counseled on breast self exam, mammography screening, feminine hygiene, menopause, adequate intake of calcium and vitamin D, diet and exercise  return annually or prn  An After Visit Summary was printed and given to the patient.

## 2019-05-29 ENCOUNTER — Other Ambulatory Visit (HOSPITAL_COMMUNITY)
Admission: RE | Admit: 2019-05-29 | Discharge: 2019-05-29 | Disposition: A | Discharge status: Home / Self Care | Payer: BC Managed Care – PPO | Source: Ambulatory Visit | Attending: Certified Nurse Midwife | Admitting: Certified Nurse Midwife

## 2019-05-29 ENCOUNTER — Other Ambulatory Visit: Payer: Self-pay

## 2019-05-29 ENCOUNTER — Encounter: Payer: Self-pay | Admitting: Certified Nurse Midwife

## 2019-05-29 ENCOUNTER — Ambulatory Visit: Payer: BC Managed Care – PPO | Admitting: Certified Nurse Midwife

## 2019-05-29 VITALS — BP 110/70 | HR 68 | Temp 97.9°F | Resp 16 | Ht 67.25 in | Wt 233.0 lb

## 2019-05-29 DIAGNOSIS — Z01419 Encounter for gynecological examination (general) (routine) without abnormal findings: Secondary | ICD-10-CM | POA: Diagnosis not present

## 2019-05-29 DIAGNOSIS — Z8742 Personal history of other diseases of the female genital tract: Secondary | ICD-10-CM | POA: Insufficient documentation

## 2019-05-29 DIAGNOSIS — Z124 Encounter for screening for malignant neoplasm of cervix: Secondary | ICD-10-CM

## 2019-05-29 DIAGNOSIS — N951 Menopausal and female climacteric states: Secondary | ICD-10-CM | POA: Diagnosis not present

## 2019-06-01 LAB — CYTOLOGY - PAP
Diagnosis: NEGATIVE
HPV: NOT DETECTED

## 2019-08-28 ENCOUNTER — Other Ambulatory Visit: Payer: Self-pay

## 2019-08-30 ENCOUNTER — Ambulatory Visit: Payer: BC Managed Care – PPO | Admitting: Obstetrics & Gynecology

## 2019-08-30 ENCOUNTER — Ambulatory Visit (INDEPENDENT_AMBULATORY_CARE_PROVIDER_SITE_OTHER): Payer: BC Managed Care – PPO

## 2019-08-30 ENCOUNTER — Other Ambulatory Visit: Payer: Self-pay

## 2019-08-30 VITALS — BP 110/62 | HR 58 | Temp 97.3°F | Ht 67.25 in | Wt 228.6 lb

## 2019-08-30 DIAGNOSIS — D251 Intramural leiomyoma of uterus: Secondary | ICD-10-CM

## 2019-08-30 DIAGNOSIS — N838 Other noninflammatory disorders of ovary, fallopian tube and broad ligament: Secondary | ICD-10-CM

## 2019-08-30 DIAGNOSIS — N83202 Unspecified ovarian cyst, left side: Secondary | ICD-10-CM

## 2019-08-30 DIAGNOSIS — N7011 Chronic salpingitis: Secondary | ICD-10-CM | POA: Diagnosis not present

## 2019-08-30 NOTE — Progress Notes (Signed)
    Addendum, Cervix W/ Multiple nabothian cysts

## 2019-08-30 NOTE — Progress Notes (Signed)
59 y.o. G33P1001 Married White or Caucasian female here for pelvic ultrasound due to bilateral ovarian cysts in PMP female.  Denies vaginal bleeding or pelvic pain.  Patient's last menstrual period was 11/10/2018.  Contraception: PMP/BTL  Findings:  UTERUS: 7.2 x 4.8 x 3.4cm with 9 x 72mm fibroid and 1.5 x 1.6cm fibroid EMS: 3.20mm ADNEXA: Left ovary:  2.1 x 1.4 x 1.7cm with 2.5 x 2.1 x 2.3cm simple, avascular cyst.  Decreased in size from imaging 03/01/2019       Right ovary: 2.4 x 1.5 x 1.2cm with 3.0 x 1.5cm cystic structure that is clearly separate from the ovary, more tubular in shape today.  More likely a hydrosalpinx.  This is avascular.   CUL DE SAC: no free fluid noted  Discussion:  Findings reviewed with pt.  Cyst is smaller.  Finding on right adnexa appears to be tubal in nature now.  Will plan to repeat PUS in 1 year.  Pt comfortable with plan  Assessment:  Left simple ovarian cyst, smaller in size Right hydrosalpinx in pt with hx of BTL Fibroids  Plan:  Repeat PUS in 1 year.  Order placed for this.  She will see French Ana for AEX in about 9 months.  Signs/symptoms for pelvic pathology discussed.  Pt will call with any new concerns.  ~15 minutes spent with patient >50% of time was in face to face discussion of above.

## 2019-08-31 ENCOUNTER — Encounter: Payer: Self-pay | Admitting: Obstetrics & Gynecology

## 2019-09-12 ENCOUNTER — Other Ambulatory Visit: Payer: Self-pay | Admitting: Allergy & Immunology

## 2019-10-08 ENCOUNTER — Other Ambulatory Visit: Payer: Self-pay | Admitting: Allergy & Immunology

## 2019-12-04 IMAGING — NM NM MYOCAR MULTI W/SPECT W/WALL MOTION & EF
2 series · 12 of 12 positions shown · non-contrast
Comparison: none

[Series 1: rest · 6.51mm/px · 6 of 64 frames shown]
[frame 6/64]
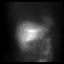
[frame 16/64]
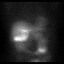
[frame 27/64]
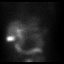
[frame 38/64]
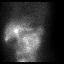
[frame 48/64]
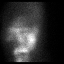
[frame 59/64]
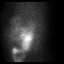

[Series 3: stress gated - perfusion · 6.51mm/px · 6 of 64 frames shown]
[frame 6/64]
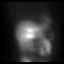
[frame 16/64]
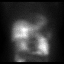
[frame 27/64]
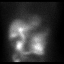
[frame 38/64]
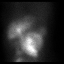
[frame 48/64]
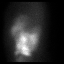
[frame 59/64]
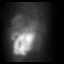

[12 of 12 positions shown; findings below may reference images not displayed]

Canned report from images found in remote index.

Refer to host system for actual result text.

## 2019-12-18 ENCOUNTER — Telehealth: Payer: Self-pay | Admitting: Obstetrics & Gynecology

## 2019-12-18 NOTE — Telephone Encounter (Signed)
Left message for pt to call back to [name], RN in triage. 

## 2019-12-18 NOTE — Telephone Encounter (Signed)
Spoke to pt. Pt states always having a cyst in right breast, but while doing self breast exam this weekend, noticed the place under cyst seems different and larger. Denies pain and discharge. Pt has not had annual MMG due to COVID. Pt scheduled to see Dr Sabra Heck 12/20/19 at 1130. Pt agreeable.   Will route to Dr Sabra Heck for review and will close encounter.

## 2019-12-18 NOTE — Telephone Encounter (Signed)
Patient is calling regarding cyst on her right breast. The patient stated that it has been there for some time, but the area under the cyst seems to be much larger.

## 2019-12-20 ENCOUNTER — Ambulatory Visit: Payer: BC Managed Care – PPO | Admitting: Obstetrics & Gynecology

## 2019-12-20 ENCOUNTER — Encounter: Payer: Self-pay | Admitting: Obstetrics & Gynecology

## 2019-12-20 ENCOUNTER — Telehealth: Payer: Self-pay | Admitting: *Deleted

## 2019-12-20 ENCOUNTER — Other Ambulatory Visit: Payer: Self-pay

## 2019-12-20 VITALS — BP 116/64 | HR 81 | Temp 98.1°F | Wt 226.0 lb

## 2019-12-20 DIAGNOSIS — N6310 Unspecified lump in the right breast, unspecified quadrant: Secondary | ICD-10-CM

## 2019-12-20 DIAGNOSIS — N6001 Solitary cyst of right breast: Secondary | ICD-10-CM

## 2019-12-20 NOTE — Progress Notes (Signed)
GYNECOLOGY  VISIT  CC:   HPI: 58 y.o. G37P1001 Married White or Caucasian female here for evaluation of breast cyst that has enlarged over the past few months.  Put off having MMG this year due to Covid but called for appt and when she described the finding, evaluation was recommended first.  She has a known hx of right sebaceous cyst.  It is larger to her, now, but is not tender and there is no redness.  Denies nipple discharge.  She isn't sure if this is now a "breast lump" and needs something else done for treatment.  GYNECOLOGIC HISTORY: Patient's last menstrual period was 11/10/2018. Contraception: none  Menopausal hormone therapy: none   Patient Active Problem List   Diagnosis Date Noted  . Intramural leiomyoma of uterus 03/04/2019  . Cysts of both ovaries 03/04/2019  . 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 Heart murmur 04/19/2018  . Bilateral sensorineural hearing loss 11/24/2015  . DIABETES MELLITUS, TYPE II 12/24/2006  . MIGRAINE HEADACHE 12/24/2006  . CARPAL TUNNEL SYNDROME 12/24/2006  . ALLERGIC RHINITIS 12/24/2006  . GERD 12/24/2006  . IBS 12/24/2006  . ARTHRITIS 12/24/2006    Past Medical History:  Diagnosis Date  . Acid reflux   . Arthritis    severe in knees and back and hands  . Complication of anesthesia    slow to awaken at times  . Fibroid    Breast  . Hearing deficit   . Heart murmur   . Hormone disorder   . Lyme disease spring 2019  . Pre-diabetes   . Seasonal allergies     Past Surgical History:  Procedure Laterality Date  . APPENDECTOMY  age 15  . COCHLEAR IMPLANT Right 2016 [facility]  . DILATATION & CURETTAGE/HYSTEROSCOPY WITH MYOSURE N/A 12/01/2018   Procedure: DILATATION & CURETTAGE/HYSTEROSCOPY WITH MYOSURE: BIOPSY OF LABIAL MAJORA;  Surgeon: Megan Salon, MD;  Location: [facility];  Service: Gynecology;  Laterality: N/A;  . ESOPHAGOGASTRODUODENOSCOPY N/A 02/06/2014   Procedure: ESOPHAGOGASTRODUODENOSCOPY  (EGD);  Surgeon: Rogene Houston, MD;  [facility];  Service: Endoscopy;  Laterality: N/A;  1200  . KNEE SURGERY Right 2018   meniscus repair  . TUBAL LIGATION  1999    MEDS:   Current Outpatient Medications on File Prior to Visit  Medication Sig Dispense Refill  . aspirin-acetaminophen-caffeine (EXCEDRIN MIGRAINE) 250-250-65 MG tablet Take by mouth every 6 (six) hours as needed for headache.    . cetirizine (ZYRTEC) 10 MG tablet Take 10 mg by mouth at bedtime.    . clobetasol ointment (TEMOVATE) 0.05 % Apply topically nightly for the next six weeks. Then decrease to nightly, two nights a week for another two weeks.  Than stop.  With a flare, you can increase to twice daily for up to 7 days. 60 g 1  . EPINEPHrine (EPIPEN 2-PAK) 0.3 mg/0.3 mL IJ SOAJ injection Inject into the muscle once.    . Ergocalciferol (VITAMIN D2) 400 units TABS Take by mouth.    . Esomeprazole Magnesium (NEXIUM 24HR PO)     . montelukast (SINGULAIR) 10 MG tablet TAKE 1 TABLET BY MOUTH ONCE A DAY AT BEDTIME. 30 tablet 5  . ranitidine (ZANTAC) 150 MG tablet Take 1 tablet (150 mg total) by mouth 2 (two) times daily. 60 tablet 5   No current facility-administered medications on file prior to visit.    ALLERGIES: Bactrim [sulfamethoxazole-trimethoprim], Doryx [doxycycline], Keflex [cephalexin], and Alpha-gal  Family History  Adopted: Yes  Problem Relation Age of Onset  . Breast cancer Mother        Late 30's-40's, brain tumor  . Lupus Maternal Aunt     SH:  Married, non smoker  Review of Systems  All other systems reviewed and are negative.   PHYSICAL EXAMINATION:    BP 116/64   Pulse 81   Temp 98.1 F (36.7 C)   Wt 226 lb (102.5 kg)   LMP 11/10/2018   SpO2 95%   BMI 35.13 kg/m     Physical Exam  Constitutional: She is oriented to person, place, and time. She appears well-developed and well-nourished.  Respiratory: Right breast exhibits skin change. Right breast exhibits no inverted  nipple, no mass, no nipple discharge and no tenderness. Left breast exhibits no inverted nipple, no mass, no nipple discharge, no skin change and no tenderness. Breasts are symmetrical.    Neurological: She is alert and oriented to person, place, and time.  Skin: Skin is warm and dry.  Psychiatric: She has a normal mood and affect.   Chaperone, Terence Lux, CMA, was present for exam.  Assessment: Enlarged sebaceous cyst on right  Plan: Pt may want this drained if possible due to fluctuance that is present so diagnostic imaging is scheduled for her.  She only wants a Mon or Tues appt and knows this may impact speed of getting appt scheduled.  She is comfortable with this.

## 2019-12-20 NOTE — Telephone Encounter (Signed)
Spoke to pt. Pt given information for Dx MMG and [place]. Pt verbalized understanding of appt date and time. Pt agreeable.   Will route to Dr Sabra Heck for review and will close encounter.

## 2019-12-20 NOTE — Telephone Encounter (Signed)
Returned call to pt. Pt to call back to office in 1 hour after being in grocery store.

## 2019-12-20 NOTE — Telephone Encounter (Signed)
Left message with Delfino Lovett to call Sharee Pimple, RN at [facility].    Call placed to [facility]ology scheduling. Patient scheduled for right breast Dx MMG and right breast US and aspiration if needed, on 12/31/18 at 2:20pm. This is scheduled at [facility]. They only do Dx MMG on Tuesdays.

## 2019-12-20 NOTE — Telephone Encounter (Signed)
Patient returning call to Jill. °

## 2019-12-20 NOTE — Telephone Encounter (Signed)
Patient returning call to [place]. Can be reached at her home number [phone]

## 2019-12-25 ENCOUNTER — Other Ambulatory Visit (HOSPITAL_COMMUNITY): Payer: Self-pay | Admitting: Obstetrics & Gynecology

## 2019-12-25 DIAGNOSIS — R928 Other abnormal and inconclusive findings on diagnostic imaging of breast: Secondary | ICD-10-CM

## 2020-01-01 ENCOUNTER — Ambulatory Visit (HOSPITAL_COMMUNITY): Admission: RE | Admit: 2020-01-01 | Payer: BC Managed Care – PPO | Source: Ambulatory Visit

## 2020-01-01 ENCOUNTER — Other Ambulatory Visit: Payer: Self-pay

## 2020-01-01 ENCOUNTER — Ambulatory Visit (HOSPITAL_COMMUNITY): Payer: BC Managed Care – PPO

## 2020-01-01 ENCOUNTER — Ambulatory Visit (HOSPITAL_COMMUNITY)
Admission: RE | Admit: 2020-01-01 | Discharge: 2020-01-01 | Disposition: A | Discharge status: Home / Self Care | Payer: BC Managed Care – PPO | Source: Ambulatory Visit | Attending: Obstetrics & Gynecology | Admitting: Obstetrics & Gynecology

## 2020-01-01 ENCOUNTER — Encounter (HOSPITAL_COMMUNITY): Payer: Self-pay

## 2020-01-01 DIAGNOSIS — N6310 Unspecified lump in the right breast, unspecified quadrant: Secondary | ICD-10-CM | POA: Insufficient documentation

## 2020-01-03 ENCOUNTER — Telehealth: Payer: Self-pay | Admitting: *Deleted

## 2020-01-03 NOTE — Telephone Encounter (Signed)
-----   Message from Megan Salon, MD sent at 01/02/2020  6:48 AM EST ----- Out of mmg hold.  Please let her know the area is still the sebaceous cyst that has been seen in the past.  If she desires removal, I would send her to a general surgeon for this.  Just wanted her to know it could be removed if she desires.  Thanks.

## 2020-01-03 NOTE — Telephone Encounter (Signed)
Burnice Logan, RN  01/03/2020 10:41 AM EST    Left message to call Sharee Pimple, RN at [facility].   Patient removed from [facility] hold.

## 2020-01-03 NOTE — Telephone Encounter (Signed)
Spoke with patient, advised per Dr. Sabra Heck. Patient declines referral to general surgery at this time, would like to continue to monitor. Is aware to return call if she changes her mind. Patient verbalizes understating and is agreeable.   Routing to provider for final review. Patient is agreeable to disposition. Will close encounter.

## 2020-02-04 ENCOUNTER — Ambulatory Visit: Payer: BC Managed Care – PPO | Attending: Internal Medicine

## 2020-02-04 ENCOUNTER — Other Ambulatory Visit: Payer: Self-pay

## 2020-02-04 DIAGNOSIS — Z20822 Contact with and (suspected) exposure to covid-19: Secondary | ICD-10-CM

## 2020-02-05 LAB — NOVEL CORONAVIRUS, NAA: SARS-CoV-2, NAA: NOT DETECTED

## 2020-02-17 ENCOUNTER — Other Ambulatory Visit: Payer: Self-pay

## 2020-02-17 ENCOUNTER — Ambulatory Visit
Admission: EM | Admit: 2020-02-17 | Discharge: 2020-02-17 | Disposition: A | Discharge status: Home / Self Care | Payer: BC Managed Care – PPO

## 2020-02-17 DIAGNOSIS — L239 Allergic contact dermatitis, unspecified cause: Secondary | ICD-10-CM

## 2020-02-17 NOTE — ED Triage Notes (Signed)
Pt presents to UC w/ c/o hives yesterday on abd, inner right thigh, and throat tingling last night. 50mg  benadryl, 12.5mg  zoloft, singulair taken by pt last night. Pt states today, her symptoms are much better. Still has rash on abd, but inner thigh rash has decreased, and no tingling in throat at this time.. Wants to know if ok to just keep taking 1/2 dose zoloft.

## 2020-02-17 NOTE — Discharge Instructions (Addendum)
Continue to take Prozac Continue to take Benadryl for symptom relief Follow-up with prescribing provider for medication management Return for worsening of symptoms

## 2020-02-17 NOTE — ED Provider Notes (Signed)
[facility]    CSN: [id number] Arrival date & time: 02/17/20  [id number]      History   Chief Complaint Chief Complaint  Patient presents with  . Rash    HPI Brandi Farley is a 59 y.o. female.   Presented to the urgent care for complaint of rash on her abdomen that started on 02/10/2020.  She denies changes in soaps, detergents, or anyone with similar symptoms.  She reports she is taking Prozac 25 mg that she started on 02/02/2020.  Due to the her rash she has decreased Prozac to 12.5 mg/day.  She localizes the rash to her abdomen and face.  She describes it as red, itchy.  Has tried benadryl with relief.  Nothing made her symptoms worse.  She reports similar symptoms in the past.     Rash   Past Medical History:  Diagnosis Date  . Acid reflux   . Arthritis    severe in knees and back and hands  . Complication of anesthesia    slow to awaken at times  . Fibroid    Breast  . Hearing deficit   . Heart murmur   . Hormone disorder   . Lyme disease spring 2019  . Pre-diabetes   . Seasonal allergies     Patient Active Problem List   Diagnosis Date Noted  . Intramural leiomyoma of uterus 03/04/2019  . Cysts of both ovaries 03/04/2019  . 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 Heart murmur 04/19/2018  . Bilateral sensorineural hearing loss 11/24/2015  . DIABETES MELLITUS, TYPE II 12/24/2006  . MIGRAINE HEADACHE 12/24/2006  . CARPAL TUNNEL SYNDROME 12/24/2006  . ALLERGIC RHINITIS 12/24/2006  . GERD 12/24/2006  . IBS 12/24/2006  . ARTHRITIS 12/24/2006    Past Surgical History:  Procedure Laterality Date  . APPENDECTOMY  age 75  . COCHLEAR IMPLANT Right 2016 [place]  . DILATATION & CURETTAGE/HYSTEROSCOPY WITH MYOSURE N/A 12/01/2018   Procedure: DILATATION & CURETTAGE/HYSTEROSCOPY WITH MYOSURE: BIOPSY OF LABIAL MAJORA;  Surgeon: Megan Salon, MD;  Location: [facility];  Service: Gynecology;  Laterality: N/A;  .  ESOPHAGOGASTRODUODENOSCOPY N/A 02/06/2014   Procedure: ESOPHAGOGASTRODUODENOSCOPY (EGD);  Surgeon: Rogene Houston, MD;  [facility];  Service: Endoscopy;  Laterality: N/A;  1200  . KNEE SURGERY Right 2018   meniscus repair  . TUBAL LIGATION  1999    OB History    Gravida  1   Para  1   Term  1   Preterm      AB      Living  1     SAB      TAB      Ectopic      Multiple      Live Births  1            Home Medications    Prior to Admission medications   Medication Sig Start Date End Date Taking? Authorizing Provider  sertraline (ZOLOFT) 25 MG tablet Take 25 mg by mouth daily.   Yes [provider]  aspirin-acetaminophen-caffeine (EXCEDRIN MIGRAINE) [phone] MG tablet Take by mouth every 6 (six) hours as needed for headache.    [provider]  cetirizine (ZYRTEC) 10 MG tablet Take 10 mg by mouth at bedtime.    [provider]  clobetasol ointment (TEMOVATE) 0.05 % Apply topically nightly for the next six weeks. Then decrease to nightly, two nights a week for another two weeks.  Than  stop.  With a flare, you can increase to twice daily for up to 7 days. 01/05/19   Megan Salon, MD  EPINEPHrine (EPIPEN 2-PAK) 0.3 mg/0.3 mL IJ SOAJ injection Inject into the muscle once.    [provider]  Ergocalciferol (VITAMIN D2) 400 units TABS Take by mouth.    [provider]  Esomeprazole Magnesium (NEXIUM 24HR PO)  12/05/19   [provider]  montelukast (SINGULAIR) 10 MG tablet TAKE 1 TABLET BY MOUTH ONCE A DAY AT BEDTIME. 10/08/19   Valentina Shaggy, MD  ranitidine (ZANTAC) 150 MG tablet Take 1 tablet (150 mg total) by mouth 2 (two) times daily. 03/13/18   Valentina Shaggy, MD    Family History Family History  Adopted: Yes  Problem Relation Age of Onset  . Breast cancer Mother        Late 30's-40's, brain tumor  . Lupus Maternal Aunt     Social History Social History   Tobacco Use  .  Smoking status: Former Smoker    Packs/day: 2.00    Years: 25.00    Pack years: 50.00    Types: Cigarettes  . Smokeless tobacco: Never Used  . Tobacco comment: quit 1999  Substance Use Topics  . Alcohol use: No    Alcohol/week: 0.0 standard drinks  . Drug use: No     Allergies   Bactrim [sulfamethoxazole-trimethoprim], Doryx [doxycycline], Keflex [cephalexin], and Alpha-gal   Review of Systems Review of Systems  Constitutional: Negative.   Respiratory: Negative.   Cardiovascular: Negative.   Skin: Positive for rash.  All other systems reviewed and are negative.    Physical Exam Triage Vital Signs ED Triage Vitals  Enc Vitals Group     BP 02/17/20 0926 119/77     Pulse Rate 02/17/20 0926 71     Resp 02/17/20 0926 16     Temp 02/17/20 0926 98 F (36.7 C)     Temp Source 02/17/20 0926 Oral     SpO2 02/17/20 0926 96 %     Weight --      Height --      Head Circumference --      Peak Flow --      Pain Score 02/17/20 0933 0     Pain Loc --      Pain Edu? --      Excl. in [place]? --    No data found.  Updated Vital Signs BP 119/77 (BP Location: Right Arm)   Pulse 71   Temp 98 F (36.7 C) (Oral)   Resp 16   LMP 11/10/2018   SpO2 96%   Visual Acuity Right Eye Distance:   Left Eye Distance:   Bilateral Distance:    Right Eye Near:   Left Eye Near:    Bilateral Near:     Physical Exam Vitals and nursing note reviewed.  Constitutional:      General: She is not in acute distress.    Appearance: Normal appearance. She is normal weight. She is not ill-appearing or toxic-appearing.  Cardiovascular:     Rate and Rhythm: Normal rate and regular rhythm.     Pulses: Normal pulses.     Heart sounds: Normal heart sounds. No murmur. No gallop.   Pulmonary:     Effort: Pulmonary effort is normal. No respiratory distress.     Breath sounds: Normal breath sounds. No stridor. No wheezing, rhonchi or rales.  Chest:     Chest wall: No tenderness.  Skin:    General:  Skin is warm.     Findings: Rash present.  Neurological:     Mental Status: She is alert.      UC Treatments / Results  Labs (all labs ordered are listed, but only abnormal results are displayed) Labs Reviewed - No data to display  EKG   Radiology No results found.  Procedures Procedures (including critical care time)  Medications Ordered in UC Medications - No data to display  Initial Impression / Assessment and Plan / UC Course  I have reviewed the triage vital signs and the nursing notes.  Pertinent labs & imaging results that were available during my care of the patient were reviewed by me and considered in my medical decision making (see chart for details).      Patient stable at discharge. Advised to continue to take Prozac as prescribed Continue to take Benadryl for symptom relief Follow-up with the prescribing provider for medication management Refused prescription of prednisone  Return for worsening symptoms   Final Clinical Impressions(s) / UC Diagnoses   Final diagnoses:  Allergic dermatitis     Discharge Instructions     Continue to take Prozac Continue to take Benadryl for symptom relief Follow-up with prescribing provider for medication management Return for worsening of symptoms    ED Prescriptions    None     PDMP not reviewed this encounter.   Emerson Monte, [place] 02/17/20 [phone]

## 2020-03-10 ENCOUNTER — Encounter: Payer: Self-pay | Admitting: Certified Nurse Midwife

## 2020-03-11 ENCOUNTER — Other Ambulatory Visit: Payer: Self-pay | Admitting: Allergy & Immunology

## 2020-05-11 ENCOUNTER — Other Ambulatory Visit: Payer: Self-pay | Admitting: Allergy & Immunology

## 2020-07-24 ENCOUNTER — Telehealth: Payer: Self-pay | Admitting: Obstetrics & Gynecology

## 2020-07-24 NOTE — Telephone Encounter (Signed)
Call to patient. Per DPR, OK to speak with patient's husband, Shirlette Scarber.   Left message with patient's husband requesting a return call to [facility] to review benefits and schedule recommended Pelvic ultrasound with Felipa Emory, MD.

## 2020-07-29 NOTE — Telephone Encounter (Signed)
Spoke with patient regarding benefits for recommended ultrasound. Patient is aware that ultrasound is transvaginal. Patient acknowledges understanding of information presented. Patient is aware of cancellation policy. Patient scheduled appointment for 08/28/2020 at 0300PM with M. Edwinna Areola, MD. Encounter closed.

## 2020-08-13 ENCOUNTER — Telehealth: Payer: Self-pay | Admitting: Obstetrics & Gynecology

## 2020-08-13 NOTE — Telephone Encounter (Signed)
Spoke with pt. Pt states needing to reschedule PUS due to work changes. Pt rescheduled for PUS on 09/11/20 at 230 pm. Pt agreeable and verbalized understanding to date and time of appt.  Encounter closed.

## 2020-08-13 NOTE — Telephone Encounter (Signed)
Patient would like to reschedule ultrasound appointment 08/28/20 because of work.

## 2020-08-28 ENCOUNTER — Other Ambulatory Visit: Payer: BC Managed Care – PPO

## 2020-08-28 ENCOUNTER — Other Ambulatory Visit: Payer: BC Managed Care – PPO | Admitting: Obstetrics & Gynecology

## 2020-09-04 ENCOUNTER — Telehealth: Payer: Self-pay

## 2020-09-04 NOTE — Telephone Encounter (Signed)
Call to patient regarding the need to reschedule PUS appointment with Dr. Sabra Heck. Patient's original appointment on 09/11/2020 cancelled due to sonographer being out of the office.  Patient requesting to call back at a later time.

## 2020-09-04 NOTE — Telephone Encounter (Signed)
Spoke with patient. Patient rescheduled PUS appointment for 09/18/2020 at 130pm with Dr. Sabra Heck. Encounter closed.

## 2020-09-11 ENCOUNTER — Other Ambulatory Visit: Payer: BC Managed Care – PPO | Admitting: Obstetrics & Gynecology

## 2020-09-11 ENCOUNTER — Other Ambulatory Visit: Payer: BC Managed Care – PPO

## 2020-09-18 ENCOUNTER — Encounter: Payer: Self-pay | Admitting: Obstetrics & Gynecology

## 2020-09-18 ENCOUNTER — Other Ambulatory Visit: Payer: BC Managed Care – PPO | Admitting: Obstetrics & Gynecology

## 2020-09-18 ENCOUNTER — Other Ambulatory Visit: Payer: BC Managed Care – PPO

## 2020-09-18 ENCOUNTER — Ambulatory Visit (INDEPENDENT_AMBULATORY_CARE_PROVIDER_SITE_OTHER): Payer: BC Managed Care – PPO

## 2020-09-18 ENCOUNTER — Other Ambulatory Visit: Payer: Self-pay

## 2020-09-18 ENCOUNTER — Ambulatory Visit (INDEPENDENT_AMBULATORY_CARE_PROVIDER_SITE_OTHER): Payer: BC Managed Care – PPO | Admitting: Obstetrics & Gynecology

## 2020-09-18 VITALS — BP 118/68 | HR 68 | Resp 16 | Wt 234.0 lb

## 2020-09-18 DIAGNOSIS — N83202 Unspecified ovarian cyst, left side: Secondary | ICD-10-CM

## 2020-09-18 DIAGNOSIS — N7011 Chronic salpingitis: Secondary | ICD-10-CM

## 2020-09-18 DIAGNOSIS — D251 Intramural leiomyoma of uterus: Secondary | ICD-10-CM | POA: Diagnosis not present

## 2020-09-18 NOTE — Progress Notes (Signed)
59 y.o. [id number] Married White or Caucasian female here for pelvic ultrasound due to h/o bilateral ovarian cysts noted in 2019.  With follow up imaging in 2020, the right ovarian "cyst" was more likely a hydrosalpinx.  She denies any vaginal bleeding or pelvic pain.  Patient's last menstrual period was 11/10/2018.  Contraception: PMP  Findings:  UTERUS: 7.3 x 5.0 x 3.1 cm with 3 fibroids.  The largest is 1.8 cm.  These appear intramural. EMS: 2.8 mm ADNEXA: Left ovary: 3.1 x 2.7 x 1.9 cm with a 2.9 x 2.0 cm simple appearing, avascular cyst.  Mean diameter is 23 mm.  There is no change from previous scan.       Right ovary: 2.2 x 1.8 x 1.7 cm with 30 x 16 mm cystic structure more consistent with hydrosalpinx that is definitely separate from the ovary. CUL DE SAC: No free fluid  Discussion: Ultrasonographer supervised.  Images compared to prior ultrasounds and 2 ultrasounds back as far as 08/17/2018.  The right adnexal finding does appear to be hydrosalpinx.  The left is simple, avascular appearing cysts appear stable.  These findings have all been followed for to complete years.  Ultrasound shows no concern for malignancy.  At this time, I think we can stop doing ultrasounds.  Patient is very comfortable with this plan.  Assessment: Left 2.9 cm simple ovarian cyst Right hydrosalpinx Known uterine fibroids  Plan: Patient will return for routine gynecological exam.  I would plan to repeat imaging with any new symptoms of bleeding or pain.  23 minutes went with patient reviewing ultrasound images from today, comparing to prior ultrasounds back as far as 07/2018, discussing management and with documentation.

## 2020-12-16 ENCOUNTER — Other Ambulatory Visit: Payer: Self-pay | Admitting: Allergy & Immunology

## 2021-08-12 ENCOUNTER — Other Ambulatory Visit: Payer: Self-pay

## 2021-08-12 ENCOUNTER — Ambulatory Visit (INDEPENDENT_AMBULATORY_CARE_PROVIDER_SITE_OTHER): Payer: BC Managed Care – PPO

## 2021-08-12 ENCOUNTER — Ambulatory Visit
Admission: RE | Admit: 2021-08-12 | Discharge: 2021-08-12 | Disposition: A | Discharge status: Home / Self Care | Payer: BC Managed Care – PPO | Source: Ambulatory Visit | Attending: Physician Assistant | Admitting: Physician Assistant

## 2021-08-12 VITALS — BP 140/75 | HR 69 | Temp 98.2°F | Resp 18

## 2021-08-12 DIAGNOSIS — M25531 Pain in right wrist: Secondary | ICD-10-CM | POA: Diagnosis not present

## 2021-08-12 DIAGNOSIS — M25521 Pain in right elbow: Secondary | ICD-10-CM | POA: Diagnosis not present

## 2021-08-12 DIAGNOSIS — S52124A Nondisplaced fracture of head of right radius, initial encounter for closed fracture: Secondary | ICD-10-CM

## 2021-08-12 DIAGNOSIS — W19XXXA Unspecified fall, initial encounter: Secondary | ICD-10-CM | POA: Diagnosis not present

## 2021-08-12 NOTE — ED Triage Notes (Signed)
Pain to RT elbow and wrist after pt tripped and fell yesterday and landed on RT side.

## 2021-08-14 NOTE — ED Provider Notes (Signed)
[facility]    CSN: [id number] Arrival date & time: 08/12/21  0941      History   Chief Complaint No chief complaint on file.   HPI Brandi Farley is a 60 y.o. female.   The history is provided by the patient. No language interpreter was used.  Arm Injury Location:  Elbow Elbow location:  R elbow Injury: yes   Time since incident:  1 day Mechanism of injury: fall   Fall:    Fall occurred:  Standing   Entrapped after fall: no  Pt landed on hand.  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   Heart murmur 04/19/2018   Bilateral sensorineural hearing loss 11/24/2015   DIABETES MELLITUS, TYPE II 12/24/2006   MIGRAINE HEADACHE 12/24/2006   CARPAL TUNNEL SYNDROME 12/24/2006   ALLERGIC RHINITIS 12/24/2006   GERD 12/24/2006   IBS 12/24/2006   ARTHRITIS 12/24/2006    Past Surgical History:  Procedure Laterality Date   APPENDECTOMY  age 34   COCHLEAR IMPLANT Right 2016 [place] N/A 12/01/2018   Procedure: DILATATION & CURETTAGE/HYSTEROSCOPY WITH MYOSURE: BIOPSY OF LABIAL MAJORA;  Surgeon: Megan Salon, MD;  Location: [facility];  Service: Gynecology;  Laterality: N/A;   ESOPHAGOGASTRODUODENOSCOPY N/A 02/06/2014   Procedure: ESOPHAGOGASTRODUODENOSCOPY (EGD);  Surgeon: Rogene Houston, MD;  [facility];  Service: Endoscopy;  Laterality: N/A;  1200   KNEE SURGERY Right 2018   meniscus  repair   TUBAL LIGATION  1999    OB History     Gravida  1   Para  1   Term  1   Preterm      AB      Living  1      SAB      IAB      Ectopic      Multiple      Live Births  1            Home Medications    Prior to Admission medications   Medication Sig Start Date End Date Taking? Authorizing Provider  aspirin-acetaminophen-caffeine (EXCEDRIN MIGRAINE) [phone] MG tablet Take by mouth every 6 (six) hours as needed for headache.    [provider]  cetirizine (ZYRTEC) 10 MG tablet Take 10 mg by mouth at bedtime.    [provider]  clobetasol ointment (TEMOVATE) 0.05 % Apply topically nightly for the next six weeks. Then decrease to nightly, two nights a week for another two weeks.  Than stop.  With a flare, you can increase to twice daily for up to 7 days. 01/05/19   Megan Salon, MD  EPINEPHrine (EPIPEN 2-PAK) 0.3 mg/0.3 mL IJ SOAJ injection Inject into the muscle once.    [provider]  Esomeprazole Magnesium (NEXIUM 24HR PO)  12/05/19   [provider]  montelukast (SINGULAIR) 10 MG tablet TAKE 1 TABLET BY MOUTH ONCE A DAY AT BEDTIME. 03/11/20   Valentina Shaggy, MD  sertraline (ZOLOFT) 25 MG tablet Take 25 mg by mouth daily.    [provider]  Vitamin D, Ergocalciferol, (DRISDOL) 1.25 MG (50000 UNIT) CAPS capsule Take 50,000 Units by mouth once a week. 07/16/20   [provider]    Family History Family History  Adopted: Yes  Problem Relation Age of Onset   Breast cancer Mother        Late 53's-40's, brain tumor   Lupus Maternal Aunt     Social History Social History   Tobacco Use   Smoking status: Former    Packs/day: 2.00    Years: 25.00    Pack years: 50.00    Types: Cigarettes   Smokeless tobacco: Never   Tobacco comments:    quit 1999  Vaping Use   Vaping Use: Never used  Substance Use Topics   Alcohol use: No    Alcohol/week: 0.0 standard drinks   Drug use: No      Allergies   Bactrim [sulfamethoxazole-trimethoprim], Doryx [doxycycline], Keflex [cephalexin], and Alpha-gal   Review of Systems Review of Systems  All other systems reviewed and are negative.   Physical Exam Triage Vital Signs ED Triage Vitals  Enc Vitals Group     BP 08/12/21 0953 140/75     Pulse Rate 08/12/21 0953 69     Resp 08/12/21 0953 18     Temp 08/12/21 0953 98.2 F (36.8 C)     Temp Source 08/12/21 0953 Oral     SpO2 08/12/21 0953 96 %     Weight --      Height --      Head Circumference --      Peak Flow --      Pain Score 08/12/21 0952 7     Pain Loc --      Pain Edu? --      Excl. in [place]? --    No data found.  Updated Vital Signs BP 140/75 (BP Location: Right Arm)   Pulse 69   Temp 98.2 F (36.8 C) (Oral)   Resp 18   LMP 11/10/2018   SpO2 96%   Visual Acuity Right Eye Distance:   Left Eye Distance:   Bilateral Distance:    Right Eye Near:   Left Eye Near:    Bilateral Near:     Physical Exam Vitals reviewed.  Musculoskeletal:        General: Swelling and tenderness present.     Comments: Tender elbow to palpation,  pain with movement  nv and ns intact   Skin:    General: Skin is warm.  Neurological:     General: No focal deficit present.     Mental Status: She is alert.  Psychiatric:        Mood and Affect: Mood normal.     UC Treatments / Results  Labs (all labs ordered are listed, but only abnormal results are displayed) Labs Reviewed - No data to display  EKG   Radiology DG Elbow Complete Right  Result Date: 08/12/2021 CLINICAL DATA:  Fall. EXAM: RIGHT ELBOW - COMPLETE 3+ VIEW COMPARISON:  None. FINDINGS: Acute minimally impacted fracture of the radial neck with associated joint effusion, best appreciated on the lateral view. No dislocation. Joint spaces are preserved. Bone mineralization is normal. Soft tissues are unremarkable. IMPRESSION: 1.  Acute minimally impacted fracture of the radial neck. Electronically  Signed   By: Titus Dubin M.D.   On: 08/12/2021 10:45   DG Wrist Complete Right  Result Date: 08/12/2021 CLINICAL DATA:  60 year old female status post fall yesterday landing on right side. EXAM: RIGHT WRIST - COMPLETE 3+ VIEW COMPARISON:  None. FINDINGS: Bone mineralization is within normal limits. There is no evidence of fracture or dislocation. There is no evidence of arthropathy or other focal bone abnormality. No discrete soft tissue injury. IMPRESSION: Negative. Electronically Signed   By: Genevie Ann M.D.   On: 08/12/2021 10:43    Procedures Procedures (including critical care time)  Medications Ordered in UC Medications - No data to display  Initial Impression / Assessment and Plan / UC Course  I have reviewed the triage vital signs and the nursing notes.  Pertinent labs & imaging results that were available during my care of the patient were reviewed by me and considered in my medical decision making (see chart for details).     MDM:  Pt placed in a sling.  Pt advised to follow up with Dr. Aline Brochure for evalatuion  Final Clinical Impressions(s) / UC Diagnoses   Final diagnoses:  Closed nondisplaced fracture of head of right radius, initial encounter   Discharge Instructions   None    ED Prescriptions   None    PDMP not reviewed this encounter. An After Visit Summary was printed and given to the patient.    Fransico Meadow, [place] 08/14/21 [phone]

## 2021-08-21 ENCOUNTER — Other Ambulatory Visit: Payer: Self-pay

## 2021-08-21 ENCOUNTER — Encounter: Payer: Self-pay | Admitting: Orthopedic Surgery

## 2021-08-21 ENCOUNTER — Ambulatory Visit: Payer: BC Managed Care – PPO | Admitting: Orthopedic Surgery

## 2021-08-21 VITALS — BP 139/98 | HR 78 | Ht 67.25 in | Wt 234.0 lb

## 2021-08-21 DIAGNOSIS — W19XXXA Unspecified fall, initial encounter: Secondary | ICD-10-CM

## 2021-08-21 DIAGNOSIS — S52124A Nondisplaced fracture of head of right radius, initial encounter for closed fracture: Secondary | ICD-10-CM

## 2021-08-21 NOTE — Progress Notes (Signed)
New Patient Visit  Assessment: Brandi Farley is a 60 y.o. female with the following: 1. Closed nondisplaced fracture of head of right radius, initial encounter  Plan: Patient sustained a minimally displaced right radial head fracture after a fall recently.  She also sustained a wrist sprain.  She has pretty good range of motion following the injury.  I reviewed the injury, unexpected pathology.  Stressed the importance of her continuing to work on her range of motion.  Anticipate that the wrist sprain will continue to improve.  My biggest concern for this injury is stiffness, and advised her to get rid of the sling.  She is in agreement with this plan.  Follow-up as needed.   Follow-up: Return if symptoms worsen or fail to improve.  Subjective:  Chief Complaint  Patient presents with   Arm Injury    Radial head fracture   New Patient (Initial Visit)    History of Present Illness: Brandi Farley is a 60 y.o. RHD female who presents for evaluation of right arm pain.  She sustained a fall, approximately 1 week ago.  She presented to the emergency department and x-rays demonstrated a minimally displaced fracture of the radial head.  She was placed in a sling.  Her pain is improved.  Her range of motion is improving.  She is been wearing the sling ever since the emergency department.   Review of Systems: No fevers or chills No numbness or tingling No chest pain No shortness of breath No bowel or bladder dysfunction No GI distress No headaches   Medical History:  Past Medical History:  Diagnosis Date   Acid reflux    Arthritis    severe in knees and back and hands   Complication of anesthesia    slow to awaken at times   Fibroid    Breast   Hearing deficit    Heart murmur    Hormone disorder    Lyme disease spring 2019   Pre-diabetes    Seasonal allergies     Past Surgical History:  Procedure Laterality Date   APPENDECTOMY  age 60   COCHLEAR IMPLANT Right 2016  [place] N/A 12/01/2018   Procedure: DILATATION & CURETTAGE/HYSTEROSCOPY WITH MYOSURE: BIOPSY OF LABIAL MAJORA;  Surgeon: Megan Salon, MD;  Location: [place];  Service: Gynecology;  Laterality: N/A;   ESOPHAGOGASTRODUODENOSCOPY N/A 02/06/2014   Procedure: ESOPHAGOGASTRODUODENOSCOPY (EGD);  Surgeon: Rogene Houston, MD;  [facility];  Service: Endoscopy;  Laterality: N/A;  1200   KNEE SURGERY Right 2018   meniscus repair   TUBAL LIGATION  1999    Family History  Adopted: Yes  Problem Relation Age of Onset   Breast cancer Mother        Late 6's-40's, brain tumor   Lupus Maternal Aunt    Social History   Tobacco Use   Smoking status: Former    Packs/day: 2.00    Years: 25.00    Pack years: 50.00    Types: Cigarettes   Smokeless tobacco: Never   Tobacco comments:    quit 1999  Vaping Use   Vaping Use: Never used  Substance Use Topics   Alcohol use: No    Alcohol/week: 0.0 standard drinks   Drug use: No    Allergies  Allergen Reactions   Bactrim [Sulfamethoxazole-Trimethoprim] Shortness Of Breath and Other (See Comments)    Cramping   Doryx [Doxycycline] Shortness Of Breath  Keflex [Cephalexin] Shortness Of Breath   Alpha-Gal Hives    Can't do pork or beef or lamb, can handle items    Current Meds  Medication Sig   aspirin-acetaminophen-caffeine (EXCEDRIN MIGRAINE) [id number] MG tablet Take by mouth every 6 (six) hours as needed for headache.   cetirizine (ZYRTEC) 10 MG tablet Take 10 mg by mouth at bedtime.   clobetasol ointment (TEMOVATE) 0.05 % Apply topically nightly for the next six weeks. Then decrease to nightly, two nights a week for another two weeks.  Than stop.  With a flare, you can increase to twice daily for up to 7 days.   EPINEPHrine 0.3 mg/0.3 mL IJ SOAJ injection Inject into the muscle once.   Esomeprazole Magnesium (NEXIUM 24HR PO)    montelukast (SINGULAIR) 10  MG tablet TAKE 1 TABLET BY MOUTH ONCE A DAY AT BEDTIME.   sertraline (ZOLOFT) 25 MG tablet Take 25 mg by mouth daily.   Vitamin D, Ergocalciferol, (DRISDOL) 1.25 MG (50000 UNIT) CAPS capsule Take 50,000 Units by mouth once a week.    Objective: BP (!) 139/98   Pulse 78   Ht 5' 7.25" (1.708 m)   Wt 234 lb (106.1 kg)   LMP 11/10/2018   BMI 36.38 kg/m   Physical Exam:  General: Alert and oriented. and No acute distress. Gait: Normal gait.  Evaluation of the right arm demonstrates no swelling.  No ecchymosis is appreciated.  Minimal tenderness to palpation over the radial head.  Near full extension.  Greater than 120 degrees of flexion.  Near full pronation and supination.  She has pain in the right wrist, especially with passive pronation and supination.  Fingers are warm and well-perfused sensation is intact throughout the right hand.  IMAGING: I personally reviewed images previously obtained from the ED  X-rays of the right wrist are negative for acute injury.  X-rays of the right elbow demonstrate minimally displaced fracture of the radial head.   New Medications:  No orders of the defined types were placed in this encounter.     Mordecai Rasmussen, MD  08/21/2021 10:48 PM

## 2021-08-21 NOTE — Patient Instructions (Signed)
Wrist Fracture Rehab Ask your health care provider which exercises are safe for you. Do exercises exactly as told by your health care provider and adjust them as directed. It is normal to feel mild stretching, pulling, tightness, or discomfort as you do these exercises. Stop right away if you feel sudden pain or your pain gets worse. Do not begin these exercises until told by your health care provider. Stretching and range-of-motion exercises These exercises warm up your muscles and joints and improve the movement and flexibility of your wrist and hand. These exercises also help to relieve pain,numbness, and tingling. Finger flexion and extension Sit or stand with your elbow at your side. Open and stretch your left / right fingers as wide as you can (extension). Hold this position for 10 seconds. Close your left / right fingers into a gentle fist (flexion). Hold this position for 10 seconds. Slowly return to the starting position. Repeat 10 times. Complete this exercise 1-2 times a day. Wrist flexion Bend your left / right elbow to a 90-degree angle (right angle) with your palm facing the floor. Bend your wrist forward so your fingers point toward the floor (flexion). Hold this position for 10 seconds. Slowly return to the starting position. Repeat 10 times. Complete this exercise 1-2 times a day. Wrist extension Bend your left / right elbow to a 90-degree angle (right angle) with your palm facing the floor. Bend your wrist backward so your fingers point toward the ceiling (extension). Hold this position for 10 seconds. Slowly return to the starting position. Repeat 10 times. Complete this exercise 1-2 times a day. Ulnar deviation Bend your left / right elbow to a 90-degree angle (right angle), and rest your forearm on a table with your palm facing down. Keeping your hand flat on the table, bend your left / right wrist toward your small finger (pinkie). This is ulnar deviation. Hold this  position for 10 seconds. Slowly return to the starting position. Repeat 10 times. Complete this exercise 1-2 times a day. Radial deviation Bend your left / right elbow to a 90-degree angle (right angle), and rest your forearm on a table with your palm facing down. Keeping your hand flat on the table, bend your left / right wrist toward your thumb. This is radial deviation. Hold this position for 10 seconds. Slowly return to the starting position. Repeat 10 times. Complete this exercise 1-2 times a day. Forearm rotation, supination Stand or sit with your left / right elbow bent to a 90-degree angle (right angle) at your side. Position your forearm so that the thumb is facing the ceiling (neutral position). Turn (rotate) your palm up toward the ceiling (supination), stopping when you feel a gentle stretch. Hold this position for 10 seconds. Slowly return to the starting position. Repeat 10 times. Complete this exercise 1-2 times a day. Forearm rotation, pronation Stand or sit with your left / right elbow bent to a 90-degree angle (right angle) at your side. Position your forearm so that the thumb is facing the ceiling (neutral position). Turn (rotate) your palm down toward the floor (pronation), stopping when you feel a gentle stretch. Hold this position for 10 seconds. Slowly return to the starting position. Repeat 10 times. Complete this exercise 1-2 times a day. Wrist flexion stretch  Extend your left / right arm in front of you and turn your palm down toward the floor. If told by your health care provider, bend your left / right arm to a 90-degree angle (  right angle) at your side. Using your uninjured hand, gently press over the back of your left / right hand to bend your wrist and fingers toward the floor (flexion). Go as far as you can to feel a stretch without causing pain. Hold this position for 10 seconds. Slowly return to the starting position. Repeat 10 times. Complete this  exercise 1-2 times a day. Wrist extension stretch  Extend your left / right arm in front of you and turn your palm up toward the ceiling. If told by your health care provider, bend your left / right arm to a 90-degree angle (right angle) at your side. Using your uninjured hand, gently press over the palm of your left / right hand to bend your wrist and fingers toward the floor (extension). Go as far as you can to feel a stretch without causing pain. Hold this position for 10 seconds. Slowly return to the starting position. Repeat 10 times. Complete this exercise 1-2 times a day. Forearm rotation stretch, supination Stand or sit with your arms at your sides. Bend your left / right elbow to a 90-degree angle (right angle). Using your uninjured hand, turn your left / right palm up toward the ceiling (assisted supination) until you feel a gentle stretch in the inside of your forearm. Hold this position for 10 seconds. Slowly return to the starting position. Repeat 10 times. Complete this exercise 1-2 times a day. Forearm rotation stretch, pronation Stand or sit with your arms at your sides. Bend your left / right elbow to a 90-degree angle (right angle). Using your uninjured hand, turn your left / right palm down toward the floor (assisted pronation) until you feel a gentle stretch in the top of your forearm. Hold this position for 10 seconds. Slowly return to the starting position. Repeat 10 times. Complete this exercise 1-2 times a day. Strengthening exercises These exercises build strength and endurance in your wrist and hand. Enduranceis the ability to use your muscles for a long time, even after they get tired. Wrist flexion Sit with your left / right forearm supported on a table. Your elbow should be at waist height. Rest your hand over the edge of the table, palm up. Gently grasp a 5 lb / kg weight (can of soup). Or, hold an exercise band or tube in both hands, keeping your hands at  the same level and hip distance apart. There should be slight tension in the exercise band or tube. Without moving your forearm or elbow, slowly bend your wrist up toward the ceiling (wrist flexion). Hold this position for 10 seconds. Slowly return to the starting position. Repeat 10 times. Complete this exercise 1-2 times a day. Wrist extension Sit with your left / right forearm supported on a table. Your elbow should be at waist height. Rest your hand over the edge of the table, palm down. Gently grasp a 5 lb / kg weight. Or, hold an exercise band or tube in both hands, keeping your hands at the same level and hip distance apart. There should be slight tension in the exercise band or tube. Without moving your forearm or elbow, slowly curl your hand up toward the ceiling (extension). Hold this position for 10 seconds. Slowly return to the starting position. Repeat 10 times. Complete this exercise 1-2 times a day. Forearm rotation, supination  Sit with your left / right forearm supported on a table. Your elbow should be at waist height. Rest your hand over the edge of the   table, palm down. Gently grasp a lightweight hammer near the head. As this exercise gets easier for you, try holding the hammer farther down the handle. Without moving your elbow, slowly turn (rotate) your palm up toward the ceiling (supination). Hold this position for 10 seconds. Slowly return to the starting position. Repeat 10 times. Complete this exercise 1-2 times a day. Forearm rotation, pronation  Sit with your left / right forearm supported on a table. Your elbow should be at waist height. Rest your hand over the edge of the table, palm up. Gently grasp a lightweight hammer near the head. As this exercise gets easier for you, try holding the hammer farther down the handle. Without moving your elbow, slowly turn (rotate) your palm down toward the floor (pronation). Hold this position for 10 seconds. Slowly return  to the starting position. Repeat 10 times. Complete this exercise 1-2 times a day. Grip strengthening  Hold one of these items in your left / right hand: a dense sponge, a stress ball, or a large, rolled sock. Slowly squeeze the object as hard as you can without increasing any pain. Hold your squeeze for 10 seconds. Slowly release your grip. Repeat 10 times. Complete this exercise 1-2 times a day. This information is not intended to replace advice given to you by your health care provider. Make sure you discuss any questions you have with your healthcare provider. Document Revised: 04/17/2020 Document Reviewed: 04/17/2020 Elsevier Patient Education  2022 [employer]vier Inc.  

## 2021-12-08 ENCOUNTER — Other Ambulatory Visit (HOSPITAL_COMMUNITY): Payer: Self-pay | Admitting: Obstetrics & Gynecology

## 2021-12-08 ENCOUNTER — Other Ambulatory Visit (HOSPITAL_COMMUNITY): Payer: Self-pay | Admitting: Internal Medicine

## 2021-12-08 DIAGNOSIS — Z1231 Encounter for screening mammogram for malignant neoplasm of breast: Secondary | ICD-10-CM

## 2021-12-28 ENCOUNTER — Other Ambulatory Visit: Payer: Self-pay

## 2021-12-28 ENCOUNTER — Ambulatory Visit (HOSPITAL_COMMUNITY)
Admission: RE | Admit: 2021-12-28 | Discharge: 2021-12-28 | Disposition: A | Discharge status: Home / Self Care | Payer: BC Managed Care – PPO | Source: Ambulatory Visit | Attending: Obstetrics & Gynecology | Admitting: Obstetrics & Gynecology

## 2021-12-28 DIAGNOSIS — Z1231 Encounter for screening mammogram for malignant neoplasm of breast: Secondary | ICD-10-CM | POA: Diagnosis not present

## 2022-01-06 ENCOUNTER — Other Ambulatory Visit: Payer: Self-pay | Admitting: Family Medicine

## 2022-01-06 ENCOUNTER — Other Ambulatory Visit (HOSPITAL_COMMUNITY): Payer: Self-pay | Admitting: Family Medicine

## 2022-01-06 DIAGNOSIS — R1011 Right upper quadrant pain: Secondary | ICD-10-CM

## 2022-01-11 ENCOUNTER — Other Ambulatory Visit: Payer: Self-pay

## 2022-01-11 ENCOUNTER — Ambulatory Visit (HOSPITAL_COMMUNITY)
Admission: RE | Admit: 2022-01-11 | Discharge: 2022-01-11 | Disposition: A | Discharge status: Home / Self Care | Payer: BC Managed Care – PPO | Source: Ambulatory Visit | Attending: Family Medicine | Admitting: Family Medicine

## 2022-01-11 DIAGNOSIS — R1011 Right upper quadrant pain: Secondary | ICD-10-CM | POA: Insufficient documentation

## 2022-01-18 ENCOUNTER — Other Ambulatory Visit (HOSPITAL_COMMUNITY): Payer: Self-pay | Admitting: Internal Medicine

## 2022-01-18 DIAGNOSIS — R1011 Right upper quadrant pain: Secondary | ICD-10-CM

## 2022-01-25 ENCOUNTER — Other Ambulatory Visit: Payer: Self-pay

## 2022-01-25 ENCOUNTER — Ambulatory Visit (HOSPITAL_COMMUNITY)
Admission: RE | Admit: 2022-01-25 | Discharge: 2022-01-25 | Disposition: A | Discharge status: Home / Self Care | Payer: BC Managed Care – PPO | Source: Ambulatory Visit | Attending: Internal Medicine | Admitting: Internal Medicine

## 2022-01-25 ENCOUNTER — Encounter (HOSPITAL_COMMUNITY): Payer: Self-pay

## 2022-01-25 DIAGNOSIS — R1011 Right upper quadrant pain: Secondary | ICD-10-CM | POA: Insufficient documentation

## 2022-01-25 MED ORDER — TECHNETIUM TC 99M MEBROFENIN IV KIT
5.0000 | PACK | Freq: Once | INTRAVENOUS | Status: AC | PRN
  Administered 2022-01-25: 5.13 via INTRAVENOUS

## 2022-01-27 ENCOUNTER — Encounter (HOSPITAL_BASED_OUTPATIENT_CLINIC_OR_DEPARTMENT_OTHER): Payer: Self-pay | Admitting: Obstetrics & Gynecology

## 2022-01-27 ENCOUNTER — Ambulatory Visit (INDEPENDENT_AMBULATORY_CARE_PROVIDER_SITE_OTHER): Payer: BC Managed Care – PPO | Admitting: Obstetrics & Gynecology

## 2022-01-27 ENCOUNTER — Other Ambulatory Visit (HOSPITAL_COMMUNITY)
Admission: RE | Admit: 2022-01-27 | Discharge: 2022-01-27 | Disposition: A | Discharge status: Home / Self Care | Payer: BC Managed Care – PPO | Source: Ambulatory Visit | Attending: Obstetrics & Gynecology | Admitting: Obstetrics & Gynecology

## 2022-01-27 ENCOUNTER — Other Ambulatory Visit: Payer: Self-pay

## 2022-01-27 VITALS — BP 143/84 | HR 75 | Ht 67.25 in | Wt 239.6 lb

## 2022-01-27 DIAGNOSIS — N7011 Chronic salpingitis: Secondary | ICD-10-CM

## 2022-01-27 DIAGNOSIS — Z780 Asymptomatic menopausal state: Secondary | ICD-10-CM

## 2022-01-27 DIAGNOSIS — Z124 Encounter for screening for malignant neoplasm of cervix: Secondary | ICD-10-CM | POA: Insufficient documentation

## 2022-01-27 DIAGNOSIS — Z01419 Encounter for gynecological examination (general) (routine) without abnormal findings: Secondary | ICD-10-CM | POA: Diagnosis not present

## 2022-01-27 DIAGNOSIS — D251 Intramural leiomyoma of uterus: Secondary | ICD-10-CM | POA: Diagnosis not present

## 2022-01-27 DIAGNOSIS — H9193 Unspecified hearing loss, bilateral: Secondary | ICD-10-CM

## 2022-01-27 NOTE — Progress Notes (Signed)
61 y.o. G19P1001 Married White or Caucasian female here for annual exam.  Had fracture of elbow last year.  Was in a sling.  Has healed really well.    Denies vaginal bleeding.  Having a lot of issues with RUQ pain.  Had HIDA scan two days ago.  Function is decreased c/w cholecystitis.  Is waiting on call from general surgeon.   Patient's last menstrual period was 11/10/2018.          Sexually active: Yes.    The current method of family planning is tubal ligation.    Exercising: does work in [employer] and walks a lot at work Smoker:  no  Health Maintenance: Pap:  05/29/2019 Negative History of abnormal Pap:  no MMG:  12/28/2021 Negative Colonoscopy:  03/03/2010, pt is aware this is due BMD:   plan around age 53 Screening Labs: does with Dr. Nevada Crane and last appt was in the summer of 2022.   reports that she has quit smoking. Her smoking use included cigarettes. She has a 50.00 pack-year smoking history. She has never used smokeless tobacco. She reports that she does not drink alcohol and does not use drugs.  Past Medical History:  Diagnosis Date   Acid reflux    Arthritis    severe in knees and back and hands   Complication of anesthesia    slow to awaken at times   Fibroid    Breast   Hearing deficit    Heart murmur    Hormone disorder    Lyme disease spring 2019   Pre-diabetes    Seasonal allergies     Past Surgical History:  Procedure Laterality Date   APPENDECTOMY  age 60   COCHLEAR IMPLANT Right 2016 [place] N/A 12/01/2018   Procedure: DILATATION & CURETTAGE/HYSTEROSCOPY WITH MYOSURE: BIOPSY OF LABIAL MAJORA;  Surgeon: Megan Salon, MD;  Location: [facility];  Service: Gynecology;  Laterality: N/A;   ESOPHAGOGASTRODUODENOSCOPY N/A 02/06/2014   Procedure: ESOPHAGOGASTRODUODENOSCOPY (EGD);  Surgeon: Rogene Houston, MD;  [facility];  Service: Endoscopy;  Laterality: N/A;  1200   KNEE  SURGERY Right 2018   meniscus repair   TUBAL LIGATION  1999    Current Outpatient Medications  Medication Sig Dispense Refill   aspirin-acetaminophen-caffeine (EXCEDRIN MIGRAINE) 250-250-65 MG tablet Take by mouth every 6 (six) hours as needed for headache.     cetirizine (ZYRTEC) 10 MG tablet Take 10 mg by mouth at bedtime.     clobetasol ointment (TEMOVATE) 0.05 % Apply topically nightly for the next six weeks. Then decrease to nightly, two nights a week for another two weeks.  Than stop.  With a flare, you can increase to twice daily for up to 7 days. 60 g 1   EPINEPHrine 0.3 mg/0.3 mL IJ SOAJ injection Inject into the muscle once.     Esomeprazole Magnesium (NEXIUM 24HR PO)      Glucosamine-Chondroit-Vit C-Mn (GLUCOSAMINE CHONDR 1500 COMPLX PO) Take by mouth.     montelukast (SINGULAIR) 10 MG tablet TAKE 1 TABLET BY MOUTH ONCE A DAY AT BEDTIME. 30 tablet 0   Rimegepant Sulfate (NURTEC) 75 MG TBDP Take by mouth.     sertraline (ZOLOFT) 25 MG tablet Take 25 mg by mouth daily.     Vitamin D, Ergocalciferol, (DRISDOL) 1.25 MG (50000 UNIT) CAPS capsule Take 50,000 Units by mouth once a week.     No current facility-administered medications for this  visit.    Family History  Adopted: Yes  Problem Relation Age of Onset   Breast cancer Mother        Late 14's-40's, brain tumor   Lupus Maternal Aunt     Review of Systems  Gastrointestinal:  Positive for abdominal pain (RUQ).  Neurological:  Positive for headaches.  All other systems reviewed and are negative.  Exam:   BP (!) 143/84 (BP Location: Right Arm, Patient Position: Sitting, Cuff Size: Large)    Pulse 75    Ht 5' 7.25" (1.708 m) Comment: reported   Wt 239 lb 9.6 oz (108.7 kg)    LMP 11/10/2018    BMI 37.25 kg/m   Height: 5' 7.25" (170.8 cm) (reported)  General appearance: alert, cooperative and appears stated age Head: Normocephalic, without obvious abnormality, atraumatic Neck: no adenopathy, supple, symmetrical, trachea  midline and thyroid normal to inspection and palpation Lungs: clear to auscultation bilaterally Breasts: normal appearance, no masses or tenderness Heart: regular rate and rhythm Abdomen: soft, non-tender; bowel sounds normal; no masses,  no organomegaly Extremities: extremities normal, atraumatic, no cyanosis or edema Skin: Skin color, texture, turgor normal. No rashes or lesions Lymph nodes: Cervical, supraclavicular, and axillary nodes normal. No abnormal inguinal nodes palpated Neurologic: Grossly normal   Pelvic: External genitalia:  no lesions              Urethra:  normal appearing urethra with no masses, tenderness or lesions              Bartholins and Skenes: normal                 Vagina: normal appearing vagina with normal color and no discharge, no lesions              Cervix: no lesions              Pap taken: Yes.   Bimanual Exam:  Uterus:  normal size, contour, position, consistency, mobility, non-tender              Adnexa: normal adnexa and no mass, fullness, tenderness               Rectovaginal: Confirms               Anus:  normal sphincter tone, no lesions  Chaperone, Octaviano Batty, CMA, was present for exam.  Assessment/Plan: 1. Well woman exam with routine gynecological exam - pap obtained today (h/o ASCUS pap) - MMG 12/28/2021 - colonoscopy 03/03/2010, pt aware this is due - plan BMD around age 62 - screening lab work done with PCP, Dr. Nevada Crane - vaccines reviewed/updated  2. Hydrosalpinx - last ultrasound was 09/18/2020 and was stable.  Will follow conservatively now.  3. Cervical cancer screening - Cytology - PAP( [facility])  4. Intramural leiomyoma of uterus  5. Postmenopausal - no HRT  6. Bilateral hearing loss, unspecified hearing loss type

## 2022-01-29 LAB — CYTOLOGY - PAP
Comment: NEGATIVE
Diagnosis: NEGATIVE
High risk HPV: NEGATIVE

## 2022-02-04 ENCOUNTER — Encounter: Payer: Self-pay | Admitting: General Surgery

## 2022-02-04 ENCOUNTER — Other Ambulatory Visit: Payer: Self-pay

## 2022-02-04 ENCOUNTER — Ambulatory Visit: Payer: BC Managed Care – PPO | Admitting: General Surgery

## 2022-02-04 VITALS — BP 133/77 | HR 68 | Temp 97.2°F | Resp 14 | Ht 67.0 in | Wt 236.0 lb

## 2022-02-04 DIAGNOSIS — K811 Chronic cholecystitis: Secondary | ICD-10-CM | POA: Diagnosis not present

## 2022-02-04 NOTE — Progress Notes (Signed)
[facility] History and Physical  Reason for Referral: Chronic cholecystitis  Referring Physician: Celene Squibb, MD  Chief Complaint   New Patient (Initial Visit)     Brandi Farley is a 61 y.o. female.  HPI: [name] sa 61 yo who has been having some epigastric RUQ pain for over a year and this is especially worse with greasy foods. She recently ate [place] and had a very severe attack. She has some associated bloating and nausea. She has had pain into her right back and shoulder area. She has some reflux but controls this with medication. She has a cochlear implant. She had a HIDA scan that demonstrated chronic cholecystitis and biliary dyskinesia findings.   Past Medical History:  Diagnosis Date   Acid reflux    Arthritis    severe in knees and back and hands   Complication of anesthesia    slow to awaken at times   Fibroid    Breast   Hearing deficit    Heart murmur    Hormone disorder    Lyme disease spring 2019   Pre-diabetes    Seasonal allergies     Past Surgical History:  Procedure Laterality Date   APPENDECTOMY  age 38   COCHLEAR IMPLANT Right 2016 [place] N/A 12/01/2018   Procedure: DILATATION & CURETTAGE/HYSTEROSCOPY WITH MYOSURE: BIOPSY OF LABIAL MAJORA;  Surgeon: Brandi Salon, MD;  Location: [facility];  Service: Gynecology;  Laterality: N/A;   ESOPHAGOGASTRODUODENOSCOPY N/A 02/06/2014   Procedure: ESOPHAGOGASTRODUODENOSCOPY (EGD);  Surgeon: Brandi Houston, MD;  [facility];  Service: Endoscopy;  Laterality: N/A;  1200   KNEE SURGERY Right 2018   meniscus repair   TUBAL LIGATION  1999    Family History  Adopted: Yes  Problem Relation Age of Onset   Breast cancer Mother        Late 12's-40's, brain tumor   Lupus Maternal Aunt     Social History   Tobacco Use   Smoking status: Former    Packs/day: 2.00    Years: 25.00    Pack years: 50.00     Types: Cigarettes   Smokeless tobacco: Never   Tobacco comments:    quit 1999  Vaping Use   Vaping Use: Never used  Substance Use Topics   Alcohol use: No    Alcohol/week: 0.0 standard drinks   Drug use: No    Medications: Farley have reviewed the patient's current medications. Allergies as of 02/04/2022       Reactions   Bactrim [sulfamethoxazole-trimethoprim] Shortness Of Breath, Other (See Comments)   Cramping   Doryx [doxycycline] Shortness Of Breath   Keflex [cephalexin] Shortness Of Breath   Alpha-gal Hives   Can't do pork or beef or lamb, can handle items        Medication List        Accurate as of February 04, 2022  1:50 PM. If you have any questions, ask your nurse or doctor.          aspirin-acetaminophen-caffeine 073-710-62 MG tablet Commonly known as: EXCEDRIN MIGRAINE Take by mouth every 6 (six) hours as needed for headache.   cetirizine 10 MG tablet Commonly known as: ZYRTEC Take 10 mg by mouth at bedtime.   clobetasol ointment 0.05 % Commonly known as: TEMOVATE Apply topically nightly for the next six weeks. Then decrease to nightly, two nights a week for another two weeks.  Than  stop.  With a flare, you can increase to twice daily for up to 7 days.   EPINEPHrine 0.3 mg/0.3 mL Soaj injection Commonly known as: EPI-PEN Inject into the muscle once.   famotidine 20 MG tablet Commonly known as: PEPCID Take 20 mg by mouth 2 (two) times daily.   GLUCOSAMINE CHONDR 1500 COMPLX PO Take by mouth.   montelukast 10 MG tablet Commonly known as: SINGULAIR TAKE 1 TABLET BY MOUTH ONCE A DAY AT BEDTIME.   NEXIUM 24HR PO   Nurtec 75 MG Tbdp Generic drug: Rimegepant Sulfate Take by mouth.   sertraline 25 MG tablet Commonly known as: ZOLOFT Take 25 mg by mouth daily.   Vitamin D (Ergocalciferol) 1.25 MG (50000 UNIT) Caps capsule Commonly known as: DRISDOL Take 50,000 Units by mouth once a week.         ROS:  A comprehensive review of  systems was negative except for: Gastrointestinal: positive for abdominal pain, nausea, and reflux symptoms Musculoskeletal: positive for joint pain Neurological: positive for headaches and hearing impairment Endocrine: positive for tired/ sluggish, cold/heat intolerance  Blood pressure 133/77, pulse 68, temperature (!) 97.2 F (36.2 C), temperature source Other (Comment), resp. rate 14, height 5\' 7"  (1.702 m), weight 236 lb (107 kg), last menstrual period 11/10/2018, SpO2 97 %. Physical Exam Vitals reviewed.  Constitutional:      Appearance: Normal appearance.  HENT:     Head: Atraumatic.     Nose: Nose normal.  Eyes:     Extraocular Movements: Extraocular movements intact.  Cardiovascular:     Rate and Rhythm: Normal rate.  Pulmonary:     Effort: Pulmonary effort is normal.     Breath sounds: Normal breath sounds.  Abdominal:     General: There is no distension.     Palpations: Abdomen is soft.     Tenderness: There is abdominal tenderness in the right upper quadrant and epigastric area.  Musculoskeletal:        General: Normal range of motion.     Cervical back: Normal range of motion.  Skin:    General: Skin is warm.  Neurological:     General: No focal deficit present.     Mental Status: She is alert and oriented to person, place, and time.  Psychiatric:        Mood and Affect: Mood normal.        Behavior: Behavior normal.    Results: CLINICAL DATA:  RIGHT upper quadrant pain   EXAM: ULTRASOUND ABDOMEN LIMITED RIGHT UPPER QUADRANT   COMPARISON:  US Abdomen, 08/10/2012.  NM HIDA, 09/24/2015.   FINDINGS: Gallbladder:   Multiple dependent echogenic and shadowing gallstones within a nondistended gallbladder, largest measuring up to 1.6 cm. Gallbladder wall measures at the upper limit of normal thickness, at 4 mm. No sonographic Murphy sign noted by sonographer.   Common bile duct:   Diameter: 0.6 cm   Liver:   No focal lesion identified. Increased hepatic  parenchymal echogenicity. Portal vein is patent on color Doppler imaging with normal direction of blood flow towards the liver.   Other: No perihepatic ascites.   IMPRESSION: 1. Cholelithiasis and mild gallbladder wall thickening. No additional sonographic findings to suggest acute cholecystitis. If clinical concern for biliary dyskinesia, consider NM HIDA for further evaluation. 2. Echogenic liver. Findings most commonly seen in hepatic steatosis, though may also represent hepatitis and/or fibrosis.     Electronically Signed   By: Michaelle Birks M.D.   On: 01/11/2022 08:43  CLINICAL DATA:  Right upper quadrant abdominal pain.   EXAM: NUCLEAR MEDICINE HEPATOBILIARY IMAGING WITH GALLBLADDER EF   TECHNIQUE: Sequential images of the abdomen were obtained out to 60 minutes following intravenous administration of radiopharmaceutical. After oral ingestion of Ensure, gallbladder ejection fraction was determined. At 60 min, normal ejection fraction is greater than 33%.   RADIOPHARMACEUTICALS:  5.13 mCi Tc-56m  Choletec IV   COMPARISON:  Ultrasound January 11, 2022   FINDINGS: There is prompt, uniform radiotracer uptake by the liver with normal filling of the intrahepatic ducts, common bile duct. Gallbladder activity is visualized, consistent with patency of cystic duct (normal < 60 minutes). Additionally there is normal biliary to bowel transit (normal < 60 minutes), consistent with patent common bile duct.   Ensure was administered and the gallbladder appears to empty normally on sequential images. Calculated gallbladder ejection fraction is 22%. (Normal gallbladder ejection fraction with Ensure is greater than 33%.)   Scintigraphic evidence of enterogastric biliary reflux.   IMPRESSION: 1. Reduced gallbladder ejection fraction as can be seen with chronic cholecystitis/biliary dyskinesia.   2.  Scintigraphic evidence of enterogastric biliary reflux.     Electronically  Signed   By: Dahlia Bailiff M.D.   On: 01/25/2022 16:43  Assessment & Plan:  Brandi Farley is a 61 y.o. female with stones and chronic cholecystitis. She has been having worsening symptoms.   PLAN: Farley counseled the patient about the indication, risks and benefits of laparoscopic cholecystectomy.  She understands there is a very small chance for bleeding, infection, injury to normal structures (including common bile duct), conversion to open surgery, persistent symptoms, evolution of postcholecystectomy diarrhea, need for secondary interventions, anesthesia reaction, cardiopulmonary issues and other risks not specifically detailed here. Farley described the expected recovery, the plan for follow-up and the restrictions during the recovery phase.  All questions were answered.  All questions were answered to the satisfaction of the patient.    Virl Cagey 02/04/2022, 1:50 PM

## 2022-02-04 NOTE — Patient Instructions (Signed)
Minimally Invasive Cholecystectomy Minimally invasive cholecystectomy is surgery to remove the gallbladder. The gallbladder is a pear-shaped organ that lies beneath the liver on the right side of the body. The gallbladder stores bile, which is a fluid that helps the body digest fats. Cholecystectomy is often done to treat inflammation (irritation and swelling) of the gallbladder (cholecystitis). This condition is usually caused by a buildup of gallstones (cholelithiasis) in the gallbladder or when the fluid in the gall bladder becomes stagnant because gallstones get stuck in the ducts (tubes) and block the flow of bile. This can result in inflammation and pain. In severe cases, emergency surgery may be required. This procedure is done through small incisions in the abdomen, instead of one large incision. It is also called laparoscopic surgery. A thin scope with a camera (laparoscope) is inserted through one incision. Then surgical instruments are inserted through the other incisions. In some cases, a minimally invasive surgery may need to be changed to a surgery that is done through a larger incision. This is called open surgery. Tell a health care provider about: Any allergies you have. All medicines you are taking, including vitamins, herbs, eye drops, creams, and over-the-counter medicines. Any problems you or family members have had with anesthetic medicines. Any bleeding problems you have. Any surgeries you have had. Any medical conditions you have. Whether you are pregnant or may be pregnant. What are the risks? Generally, this is a safe procedure. However, problems may occur, including: Infection. Bleeding. Allergic reactions to medicines. Damage to nearby structures or organs. A gallstone remaining in the common bile duct. The common bile duct carries bile from the gallbladder to the small intestine. A bile leak from the liver or cystic duct after your gallbladder is removed. What happens  before the procedure? When to stop eating and drinking Follow instructions from your health care provider about what you may eat and drink before your procedure. These may include: If you do not follow your health care provider's instructions, your procedure may be delayed or canceled. Medicines Ask your health care provider about: Changing or stopping your regular medicines. This is especially important if you are taking diabetes medicines or blood thinners. Taking medicines such as aspirin and ibuprofen. These medicines can thin your blood. Do not take these medicines unless your health care provider tells you to take them. Taking over-the-counter medicines, vitamins, herbs, and supplements. General instructions If you will be going home right after the procedure, plan to have a responsible adult: Take you home from the hospital or clinic. You will not be allowed to drive. Care for you for the time you are told. Do not use any products that contain nicotine or tobacco for at least 4 weeks before the procedure. These products include cigarettes, chewing tobacco, and vaping devices, such as e-cigarettes. If you need help quitting, ask your health care provider. Ask your health care provider: How your surgery site will be marked. What steps will be taken to help prevent infection. These may include: Removing hair at the surgery site. Washing skin with a germ-killing soap. Taking antibiotic medicine. What happens during the procedure?  An IV will be inserted into one of your veins. You will be given one or both of the following: A medicine to help you relax (sedative). A medicine to make you fall asleep (general anesthetic). Your surgeon will make several small incisions in your abdomen. The laparoscope will be inserted through one of the small incisions. The camera on the laparoscope will  send images to a monitor in the operating room. This lets your surgeon see inside your abdomen. A gas  will be pumped into your abdomen. This will expand your abdomen to give the surgeon more room to perform the surgery. Other tools that are needed for the procedure will be inserted through the other incisions. The gallbladder will be removed through one of the incisions. Your common bile duct may be examined. If stones are found in the common bile duct, they may be removed. After your gallbladder has been removed, the incisions will be closed with stitches (sutures), staples, or skin glue. Your incisions will be covered with a bandage (dressing). The procedure may vary among health care providers and hospitals. What happens after the procedure? Your blood pressure, heart rate, breathing rate, and blood oxygen level will be monitored until you leave the hospital or clinic. You will be given medicines as needed to control your pain. You may have a drain placed in the incision. The drain will be removed a day or two after the procedure. Summary Minimally invasive cholecystectomy, also called laparoscopic cholecystectomy, is surgery to remove the gallbladder using small incisions. Tell your health care provider about all the medical conditions you have and all the medicines you are taking for those conditions. Before the procedure, follow instructions about when to stop eating and drinking and changing or stopping medicines. Plan to have a responsible adult care for you for the time you are told after you leave the hospital or clinic. This information is not intended to replace advice given to you by your health care provider. Make sure you discuss any questions you have with your health care provider. Document Revised: 06/09/2021 Document Reviewed: 06/09/2021 Elsevier Patient Education  [employer].

## 2022-02-05 NOTE — Patient Instructions (Signed)
[facility]  02/05/2022     @[email or web address] @   Your procedure is scheduled on  02/10/2022.   Report to [facility] at  0830 A.M.   Call this number if you have problems the morning of surgery:  [phone]   Remember:  Do not eat or drink after midnight.    DO NOT take any medications for diabetes the morning of your procedure.        Take these medicines the morning of surgery with A SIP OF WATER                   nexium, pepcid, nurtec(if needed), zoloft.      Do not wear jewelry, make-up or nail polish.  Do not wear lotions, powders, or perfumes, or deodorant.  Do not shave 48 hours prior to surgery.  Men may shave face and neck.  Do not bring valuables to the hospital.  [facility] is not responsible for any belongings or valuables.  Contacts, dentures or bridgework may not be worn into surgery.  Leave your suitcase in the car.  After surgery it may be brought to your room.  For patients admitted to the hospital, discharge time will be determined by your treatment team.  Patients discharged the day of surgery will not be allowed to drive home and must have someone with them for 24 hours.    Special instructions:   DO NOT smoke tobacco or vape for 24 hours before your procedure.  Please read over the following fact sheets that you were given. Coughing and Deep Breathing, Surgical Site Infection Prevention, Anesthesia Post-op Instructions, and Care and Recovery After Surgery      Minimally Invasive Cholecystectomy, Care After The following information offers guidance on how to care for yourself after your procedure. Your health care provider may also give you more specific instructions. If you have problems or questions, contact your health care provider. What can I expect after the procedure? After the procedure, it is common to have: Pain at your incision sites. You will be given medicines to control this pain. Mild nausea or  vomiting. Bloating and possible shoulder pain from the gas that was used during the procedure. Follow these instructions at home: Medicines Take over-the-counter and prescription medicines only as told by your health care provider. If you were prescribed an antibiotic medicine, take it as told by your health care provider. Do not stop using the antibiotic even if you start to feel better. Ask your health care provider if the medicine prescribed to you: Requires you to avoid driving or using machinery. Can cause constipation. You may need to take these actions to prevent or treat constipation: Drink enough fluid to keep your urine pale yellow. Take over-the-counter or prescription medicines. Eat foods that are high in fiber, such as beans, whole grains, and fresh fruits and vegetables. Limit foods that are high in fat and processed sugars, such as fried or sweet foods. Incision care  Follow instructions from your health care provider about how to take care of your incisions. Make sure you: Wash your hands with soap and water for at least 20 seconds before and after you change your bandage (dressing). If soap and water are not available, use hand sanitizer. Change your dressing as told by your health care provider. Leave stitches (sutures), skin glue, or adhesive strips in place. These skin closures may need to be in place for 2 weeks or  longer. If adhesive strip edges start to loosen and curl up, you may trim the loose edges. Do not remove adhesive strips completely unless your health care provider tells you to do that. Do not take baths, swim, or use a hot tub until your health care provider approves. Ask your health care provider if you may take showers. You may only be allowed to take sponge baths. Check your incision area every day for signs of infection. Check for: More redness, swelling, or pain. Fluid or blood. Warmth. Pus or a bad smell. Activity Rest as told by your health care  provider. Do not do activities that require a lot of effort. Avoid sitting for a long time without moving. Get up to take short walks every 1-2 hours. This is important to improve blood flow and breathing. Ask for help if you feel weak or unsteady. Do not lift anything that is heavier than 10 lb (4.5 kg), or the limit that you are told, until your health care provider says that it is safe. Do not play contact sports until your health care provider approves. Do not return to work or school until your health care provider approves. Return to your normal activities as told by your health care provider. Ask your health care provider what activities are safe for you. General instructions If you were given a sedative during the procedure, it can affect you for several hours. Do not drive or operate machinery until your health care provider says that it is safe. Keep all follow-up visits. This is important. Contact a health care provider if: You develop a rash. You have more redness, swelling, or pain around your incisions. You have fluid or blood coming from your incisions. Your incisions feel warm to the touch. You have pus or a bad smell coming from your incisions. You have a fever. One or more of your incisions breaks open. Get help right away if: You have trouble breathing. You have chest pain. You have more pain in your shoulders. You faint or feel dizzy when you stand. You have severe pain in your abdomen. You have nausea or vomiting that lasts for more than one day. You have leg pain that is new or unusual, or if it is localized to one specific spot. These symptoms may represent a serious problem that is an emergency. Do not wait to see if the symptoms will go away. Get medical help right away. Call your local emergency services (911 in the [place] Do not drive yourself to the hospital. Summary After your procedure, it is common to have pain at the incision sites. You may also have nausea  or bloating. Follow your health care provider's instructions about medicine, activity restrictions, and caring for your incision areas. Do not do activities that require a lot of effort. Contact a health care provider if you have a fever or other signs of infection, such as more redness, swelling, or pain around the incisions. Get help right away if you have chest pain, increasing pain in the shoulders, or trouble breathing. This information is not intended to replace advice given to you by your health care provider. Make sure you discuss any questions you have with your health care provider. Document Revised: 06/09/2021 Document Reviewed: 06/09/2021 Elsevier Patient Education  [place] Anesthesia, Adult, Care After This sheet gives you information about how to care for yourself after your procedure. Your health care provider may also give you more specific instructions. If you have problems or  questions, contact your health care provider. What can I expect after the procedure? After the procedure, the following side effects are common: Pain or discomfort at the IV site. Nausea. Vomiting. Sore throat. Trouble concentrating. Feeling cold or chills. Feeling weak or tired. Sleepiness and fatigue. Soreness and body aches. These side effects can affect parts of the body that were not involved in surgery. Follow these instructions at home: For the time period you were told by your health care provider:  Rest. Do not participate in activities where you could fall or become injured. Do not drive or use machinery. Do not drink alcohol. Do not take sleeping pills or medicines that cause drowsiness. Do not make important decisions or sign legal documents. Do not take care of children on your own. Eating and drinking Follow any instructions from your health care provider about eating or drinking restrictions. When you feel hungry, start by eating small amounts of foods that are  soft and easy to digest (bland), such as toast. Gradually return to your regular diet. Drink enough fluid to keep your urine pale yellow. If you vomit, rehydrate by drinking water, juice, or clear broth. General instructions If you have sleep apnea, surgery and certain medicines can increase your risk for breathing problems. Follow instructions from your health care provider about wearing your sleep device: Anytime you are sleeping, including during daytime naps. While taking prescription pain medicines, sleeping medicines, or medicines that make you drowsy. Have a responsible adult stay with you for the time you are told. It is important to have someone help care for you until you are awake and alert. Return to your normal activities as told by your health care provider. Ask your health care provider what activities are safe for you. Take over-the-counter and prescription medicines only as told by your health care provider. If you smoke, do not smoke without supervision. Keep all follow-up visits as told by your health care provider. This is important. Contact a health care provider if: You have nausea or vomiting that does not get better with medicine. You cannot eat or drink without vomiting. You have pain that does not get better with medicine. You are unable to pass urine. You develop a skin rash. You have a fever. You have redness around your IV site that gets worse. Get help right away if: You have difficulty breathing. You have chest pain. You have blood in your urine or stool, or you vomit blood. Summary After the procedure, it is common to have a sore throat or nausea. It is also common to feel tired. Have a responsible adult stay with you for the time you are told. It is important to have someone help care for you until you are awake and alert. When you feel hungry, start by eating small amounts of foods that are soft and easy to digest (bland), such as toast. Gradually return  to your regular diet. Drink enough fluid to keep your urine pale yellow. Return to your normal activities as told by your health care provider. Ask your health care provider what activities are safe for you. This information is not intended to replace advice given to you by your health care provider. Make sure you discuss any questions you have with your health care provider. Document Revised: 08/21/2020 Document Reviewed: 03/20/2020 Elsevier Patient Education  20[employer]. How to Use Chlorhexidine for Bathing Chlorhexidine gluconate (CHG) is a germ-killing (antiseptic) solution that is used to clean the skin. It can get rid  of the bacteria that normally live on the skin and can keep them away for about 24 hours. To clean your skin with CHG, you may be given: A CHG solution to use in the shower or as part of a sponge bath. A prepackaged cloth that contains CHG. Cleaning your skin with CHG may help lower the risk for infection: While you are staying in the intensive care unit of the hospital. If you have a vascular access, such as a central line, to provide short-term or long-term access to your veins. If you have a catheter to drain urine from your bladder. If you are on a ventilator. A ventilator is a machine that helps you breathe by moving air in and out of your lungs. After surgery. What are the risks? Risks of using CHG include: A skin reaction. Hearing loss, if CHG gets in your ears and you have a perforated eardrum. Eye injury, if CHG gets in your eyes and is not rinsed out. The CHG product catching fire. Make sure that you avoid smoking and flames after applying CHG to your skin. Do not use CHG: If you have a chlorhexidine allergy or have previously reacted to chlorhexidine. On babies younger than 58 months of age. How to use CHG solution Use CHG only as told by your health care provider, and follow the instructions on the label. Use the full amount of CHG as directed.  Usually, this is one bottle. During a shower Follow these steps when using CHG solution during a shower (unless your health care provider gives you different instructions): Start the shower. Use your normal soap and shampoo to wash your face and hair. Turn off the shower or move out of the shower stream. Pour the CHG onto a clean washcloth. Do not use any type of brush or rough-edged sponge. Starting at your neck, lather your body down to your toes. Make sure you follow these instructions: If you will be having surgery, pay special attention to the part of your body where you will be having surgery. Scrub this area for at least 1 minute. Do not use CHG on your head or face. If the solution gets into your ears or eyes, rinse them well with water. Avoid your genital area. Avoid any areas of skin that have broken skin, cuts, or scrapes. Scrub your back and under your arms. Make sure to wash skin folds. Let the lather sit on your skin for 1-2 minutes or as long as told by your health care provider. Thoroughly rinse your entire body in the shower. Make sure that all body creases and crevices are rinsed well. Dry off with a clean towel. Do not put any substances on your body afterward--such as powder, lotion, or perfume--unless you are told to do so by your health care provider. Only use lotions that are recommended by the manufacturer. Put on clean clothes or pajamas. If it is the night before your surgery, sleep in clean sheets.  During a sponge bath Follow these steps when using CHG solution during a sponge bath (unless your health care provider gives you different instructions): Use your normal soap and shampoo to wash your face and hair. Pour the CHG onto a clean washcloth. Starting at your neck, lather your body down to your toes. Make sure you follow these instructions: If you will be having surgery, pay special attention to the part of your body where you will be having surgery. Scrub this  area for at least 1 minute. Do  not use CHG on your head or face. If the solution gets into your ears or eyes, rinse them well with water. Avoid your genital area. Avoid any areas of skin that have broken skin, cuts, or scrapes. Scrub your back and under your arms. Make sure to wash skin folds. Let the lather sit on your skin for 1-2 minutes or as long as told by your health care provider. Using a different clean, wet washcloth, thoroughly rinse your entire body. Make sure that all body creases and crevices are rinsed well. Dry off with a clean towel. Do not put any substances on your body afterward--such as powder, lotion, or perfume--unless you are told to do so by your health care provider. Only use lotions that are recommended by the manufacturer. Put on clean clothes or pajamas. If it is the night before your surgery, sleep in clean sheets. How to use CHG prepackaged cloths Only use CHG cloths as told by your health care provider, and follow the instructions on the label. Use the CHG cloth on clean, dry skin. Do not use the CHG cloth on your head or face unless your health care provider tells you to. When washing with the CHG cloth: Avoid your genital area. Avoid any areas of skin that have broken skin, cuts, or scrapes. Before surgery Follow these steps when using a CHG cloth to clean before surgery (unless your health care provider gives you different instructions): Using the CHG cloth, vigorously scrub the part of your body where you will be having surgery. Scrub using a back-and-forth motion for 3 minutes. The area on your body should be completely wet with CHG when you are done scrubbing. Do not rinse. Discard the cloth and let the area air-dry. Do not put any substances on the area afterward, such as powder, lotion, or perfume. Put on clean clothes or pajamas. If it is the night before your surgery, sleep in clean sheets.  For general bathing Follow these steps when using CHG  cloths for general bathing (unless your health care provider gives you different instructions). Use a separate CHG cloth for each area of your body. Make sure you wash between any folds of skin and between your fingers and toes. Wash your body in the following order, switching to a new cloth after each step: The front of your neck, shoulders, and chest. Both of your arms, under your arms, and your hands. Your stomach and groin area, avoiding the genitals. Your right leg and foot. Your left leg and foot. The back of your neck, your back, and your buttocks. Do not rinse. Discard the cloth and let the area air-dry. Do not put any substances on your body afterward--such as powder, lotion, or perfume--unless you are told to do so by your health care provider. Only use lotions that are recommended by the manufacturer. Put on clean clothes or pajamas. Contact a health care provider if: Your skin gets irritated after scrubbing. You have questions about using your solution or cloth. You swallow any chlorhexidine. Call your local poison control center (1-5125112050 in the [place] Get help right away if: Your eyes itch badly, or they become very red or swollen. Your skin itches badly and is red or swollen. Your hearing changes. You have trouble seeing. You have swelling or tingling in your mouth or throat. You have trouble breathing. These symptoms may represent a serious problem that is an emergency. Do not wait to see if the symptoms will go away. Get medical help  right away. Call your local emergency services (911 in the [place] Do not drive yourself to the hospital. Summary Chlorhexidine gluconate (CHG) is a germ-killing (antiseptic) solution that is used to clean the skin. Cleaning your skin with CHG may help to lower your risk for infection. You may be given CHG to use for bathing. It may be in a bottle or in a prepackaged cloth to use on your skin. Carefully follow your health care provider's  instructions and the instructions on the product label. Do not use CHG if you have a chlorhexidine allergy. Contact your health care provider if your skin gets irritated after scrubbing. This information is not intended to replace advice given to you by your health care provider. Make sure you discuss any questions you have with your health care provider. Document Revised: 02/16/2021 Document Reviewed: 02/16/2021 Elsevier Patient Education  2022 [employer].

## 2022-02-05 NOTE — H&P (Signed)
[facility] History and Physical  Reason for Referral: Chronic cholecystitis  Referring Physician: Celene Squibb, MD  Chief Complaint   New Patient (Initial Visit)     Brandi Farley is a 61 y.o. female.  HPI: Brandi Farley I sa 61 yo who has been having some epigastric RUQ pain for over a year and this is especially worse with greasy foods. She recently ate [place] and had a very severe attack. She has some associated bloating and nausea. She has had pain into her right back and shoulder area. She has some reflux but controls this with medication. She has a cochlear implant. She had a HIDA scan that demonstrated chronic cholecystitis and biliary dyskinesia findings.   Past Medical History:  Diagnosis Date   Acid reflux    Arthritis    severe in knees and back and hands   Complication of anesthesia    slow to awaken at times   Fibroid    Breast   Hearing deficit    Heart murmur    Hormone disorder    Lyme disease spring 2019   Pre-diabetes    Seasonal allergies     Past Surgical History:  Procedure Laterality Date   APPENDECTOMY  age 58   COCHLEAR IMPLANT Right 2016 [place] N/A 12/01/2018   Procedure: DILATATION & CURETTAGE/HYSTEROSCOPY WITH MYOSURE: BIOPSY OF LABIAL MAJORA;  Surgeon: Megan Salon, MD;  Location: [facility];  Service: Gynecology;  Laterality: N/A;   ESOPHAGOGASTRODUODENOSCOPY N/A 02/06/2014   Procedure: ESOPHAGOGASTRODUODENOSCOPY (EGD);  Surgeon: Rogene Houston, MD;  [facility];  Service: Endoscopy;  Laterality: N/A;  1200   KNEE SURGERY Right 2018   meniscus repair   TUBAL LIGATION  1999    Family History  Adopted: Yes  Problem Relation Age of Onset   Breast cancer Mother        Late 39's-40's, brain tumor   Lupus Maternal Aunt     Social History   Tobacco Use   Smoking status: Former    Packs/day: 2.00    Years: 25.00    Pack years: 50.00     Types: Cigarettes   Smokeless tobacco: Never   Tobacco comments:    quit 1999  Vaping Use   Vaping Use: Never used  Substance Use Topics   Alcohol use: No    Alcohol/week: 0.0 standard drinks   Drug use: No    Medications: I have reviewed the patient's current medications. Allergies as of 02/04/2022       Reactions   Bactrim [sulfamethoxazole-trimethoprim] Shortness Of Breath, Other (See Comments)   Cramping   Doryx [doxycycline] Shortness Of Breath   Keflex [cephalexin] Shortness Of Breath   Alpha-gal Hives   Can't do pork or beef or lamb, can handle items        Medication List        Accurate as of February 04, 2022  1:50 PM. If you have any questions, ask your nurse or doctor.          aspirin-acetaminophen-caffeine 786-767-20 MG tablet Commonly known as: EXCEDRIN MIGRAINE Take by mouth every 6 (six) hours as needed for headache.   cetirizine 10 MG tablet Commonly known as: ZYRTEC Take 10 mg by mouth at bedtime.   clobetasol ointment 0.05 % Commonly known as: TEMOVATE Apply topically nightly for the next six weeks. Then decrease to nightly, two nights a week for another two weeks.  Than  stop.  With a flare, you can increase to twice daily for up to 7 days.   EPINEPHrine 0.3 mg/0.3 mL Soaj injection Commonly known as: EPI-PEN Inject into the muscle once.   famotidine 20 MG tablet Commonly known as: PEPCID Take 20 mg by mouth 2 (two) times daily.   GLUCOSAMINE CHONDR 1500 COMPLX PO Take by mouth.   montelukast 10 MG tablet Commonly known as: SINGULAIR TAKE 1 TABLET BY MOUTH ONCE A DAY AT BEDTIME.   NEXIUM 24HR PO   Nurtec 75 MG Tbdp Generic drug: Rimegepant Sulfate Take by mouth.   sertraline 25 MG tablet Commonly known as: ZOLOFT Take 25 mg by mouth daily.   Vitamin D (Ergocalciferol) 1.25 MG (50000 UNIT) Caps capsule Commonly known as: DRISDOL Take 50,000 Units by mouth once a week.         ROS:  A comprehensive review of  systems was negative except for: Gastrointestinal: positive for abdominal pain, nausea, and reflux symptoms Musculoskeletal: positive for joint pain Neurological: positive for headaches and hearing impairment Endocrine: positive for tired/ sluggish, cold/heat intolerance  Blood pressure 133/77, pulse 68, temperature (!) 97.2 F (36.2 C), temperature source Other (Comment), resp. rate 14, height 5\' 7"  (1.702 m), weight 236 lb (107 kg), last menstrual period 11/10/2018, SpO2 97 %. Physical Exam Vitals reviewed.  Constitutional:      Appearance: Normal appearance.  HENT:     Head: Atraumatic.     Nose: Nose normal.  Eyes:     Extraocular Movements: Extraocular movements intact.  Cardiovascular:     Rate and Rhythm: Normal rate.  Pulmonary:     Effort: Pulmonary effort is normal.     Breath sounds: Normal breath sounds.  Abdominal:     General: There is no distension.     Palpations: Abdomen is soft.     Tenderness: There is abdominal tenderness in the right upper quadrant and epigastric area.  Musculoskeletal:        General: Normal range of motion.     Cervical back: Normal range of motion.  Skin:    General: Skin is warm.  Neurological:     General: No focal deficit present.     Mental Status: She is alert and oriented to person, place, and time.  Psychiatric:        Mood and Affect: Mood normal.        Behavior: Behavior normal.    Results: CLINICAL DATA:  RIGHT upper quadrant pain   EXAM: ULTRASOUND ABDOMEN LIMITED RIGHT UPPER QUADRANT   COMPARISON:  US Abdomen, 08/10/2012.  NM HIDA, 09/24/2015.   FINDINGS: Gallbladder:   Multiple dependent echogenic and shadowing gallstones within a nondistended gallbladder, largest measuring up to 1.6 cm. Gallbladder wall measures at the upper limit of normal thickness, at 4 mm. No sonographic Murphy sign noted by sonographer.   Common bile duct:   Diameter: 0.6 cm   Liver:   No focal lesion identified. Increased hepatic  parenchymal echogenicity. Portal vein is patent on color Doppler imaging with normal direction of blood flow towards the liver.   Other: No perihepatic ascites.   IMPRESSION: 1. Cholelithiasis and mild gallbladder wall thickening. No additional sonographic findings to suggest acute cholecystitis. If clinical concern for biliary dyskinesia, consider NM HIDA for further evaluation. 2. Echogenic liver. Findings most commonly seen in hepatic steatosis, though may also represent hepatitis and/or fibrosis.     Electronically Signed   By: Michaelle Birks M.D.   On: 01/11/2022 08:43  CLINICAL DATA:  Right upper quadrant abdominal pain.   EXAM: NUCLEAR MEDICINE HEPATOBILIARY IMAGING WITH GALLBLADDER EF   TECHNIQUE: Sequential images of the abdomen were obtained out to 60 minutes following intravenous administration of radiopharmaceutical. After oral ingestion of Ensure, gallbladder ejection fraction was determined. At 60 min, normal ejection fraction is greater than 33%.   RADIOPHARMACEUTICALS:  5.13 mCi Tc-51m  Choletec IV   COMPARISON:  Ultrasound January 11, 2022   FINDINGS: There is prompt, uniform radiotracer uptake by the liver with normal filling of the intrahepatic ducts, common bile duct. Gallbladder activity is visualized, consistent with patency of cystic duct (normal < 60 minutes). Additionally there is normal biliary to bowel transit (normal < 60 minutes), consistent with patent common bile duct.   Ensure was administered and the gallbladder appears to empty normally on sequential images. Calculated gallbladder ejection fraction is 22%. (Normal gallbladder ejection fraction with Ensure is greater than 33%.)   Scintigraphic evidence of enterogastric biliary reflux.   IMPRESSION: 1. Reduced gallbladder ejection fraction as can be seen with chronic cholecystitis/biliary dyskinesia.   2.  Scintigraphic evidence of enterogastric biliary reflux.     Electronically  Signed   By: Dahlia Bailiff M.D.   On: 01/25/2022 16:43  Assessment & Plan:  Brandi Farley is a 61 y.o. female with stones and chronic cholecystitis. She has been having worsening symptoms.   PLAN: I counseled the patient about the indication, risks and benefits of laparoscopic cholecystectomy.  She understands there is a very small chance for bleeding, infection, injury to normal structures (including common bile duct), conversion to open surgery, persistent symptoms, evolution of postcholecystectomy diarrhea, need for secondary interventions, anesthesia reaction, cardiopulmonary issues and other risks not specifically detailed here. I described the expected recovery, the plan for follow-up and the restrictions during the recovery phase.  All questions were answered.  All questions were answered to the satisfaction of the patient.    Virl Cagey 02/04/2022, 1:50 PM

## 2022-02-08 ENCOUNTER — Encounter (HOSPITAL_COMMUNITY)
Admission: RE | Admit: 2022-02-08 | Discharge: 2022-02-08 | Disposition: A | Discharge status: Home / Self Care | Payer: BC Managed Care – PPO | Source: Ambulatory Visit | Attending: General Surgery | Admitting: General Surgery

## 2022-02-08 ENCOUNTER — Encounter (HOSPITAL_COMMUNITY): Payer: Self-pay

## 2022-02-08 DIAGNOSIS — K219 Gastro-esophageal reflux disease without esophagitis: Secondary | ICD-10-CM | POA: Diagnosis not present

## 2022-02-08 DIAGNOSIS — Z01812 Encounter for preprocedural laboratory examination: Secondary | ICD-10-CM | POA: Insufficient documentation

## 2022-02-08 DIAGNOSIS — K8010 Calculus of gallbladder with chronic cholecystitis without obstruction: Secondary | ICD-10-CM | POA: Diagnosis not present

## 2022-02-08 DIAGNOSIS — R7303 Prediabetes: Secondary | ICD-10-CM | POA: Diagnosis not present

## 2022-02-08 DIAGNOSIS — M1991 Primary osteoarthritis, unspecified site: Secondary | ICD-10-CM | POA: Diagnosis not present

## 2022-02-08 DIAGNOSIS — F32A Depression, unspecified: Secondary | ICD-10-CM | POA: Diagnosis not present

## 2022-02-08 DIAGNOSIS — F419 Anxiety disorder, unspecified: Secondary | ICD-10-CM | POA: Diagnosis not present

## 2022-02-08 DIAGNOSIS — Z87891 Personal history of nicotine dependence: Secondary | ICD-10-CM | POA: Diagnosis not present

## 2022-02-08 DIAGNOSIS — G709 Myoneural disorder, unspecified: Secondary | ICD-10-CM | POA: Diagnosis not present

## 2022-02-08 DIAGNOSIS — K811 Chronic cholecystitis: Secondary | ICD-10-CM | POA: Diagnosis present

## 2022-02-08 HISTORY — DX: Anxiety disorder, unspecified: F41.9

## 2022-02-08 HISTORY — DX: Depression, unspecified: F32.A

## 2022-02-08 LAB — BASIC METABOLIC PANEL
Anion gap: 6 (ref 5–15)
BUN: 9 mg/dL (ref 6–20)
CO2: 24 mmol/L (ref 22–32)
Calcium: 8.6 mg/dL — ABNORMAL LOW (ref 8.9–10.3)
Chloride: 105 mmol/L (ref 98–111)
Creatinine, Ser: 0.58 mg/dL (ref 0.44–1.00)
GFR, Estimated: >60 mL/min (ref >60)
Glucose, Bld: 102 mg/dL — ABNORMAL HIGH (ref 70–99)
Potassium: 3.4 mmol/L — ABNORMAL LOW (ref 3.5–5.1)
Sodium: 135 mmol/L (ref 135–145)

## 2022-02-09 LAB — HEMOGLOBIN A1C
Hgb A1c MFr Bld: 6 % — ABNORMAL HIGH (ref 4.8–5.6)
Mean Plasma Glucose: 126 mg/dL

## 2022-02-10 ENCOUNTER — Ambulatory Visit (HOSPITAL_COMMUNITY)
Admission: RE | Admit: 2022-02-10 | Discharge: 2022-02-10 | Disposition: A | Discharge status: Home / Self Care | Payer: BC Managed Care – PPO | Attending: General Surgery | Admitting: General Surgery

## 2022-02-10 ENCOUNTER — Ambulatory Visit (HOSPITAL_COMMUNITY): Payer: BC Managed Care – PPO | Admitting: Anesthesiology

## 2022-02-10 ENCOUNTER — Encounter (HOSPITAL_COMMUNITY): Payer: Self-pay | Admitting: General Surgery

## 2022-02-10 ENCOUNTER — Other Ambulatory Visit: Payer: Self-pay

## 2022-02-10 ENCOUNTER — Encounter (HOSPITAL_COMMUNITY)
Admission: RE | Disposition: A | Discharge status: Home / Self Care | Payer: Self-pay | Source: Home / Self Care | Attending: General Surgery

## 2022-02-10 DIAGNOSIS — M1991 Primary osteoarthritis, unspecified site: Secondary | ICD-10-CM | POA: Insufficient documentation

## 2022-02-10 DIAGNOSIS — Z87891 Personal history of nicotine dependence: Secondary | ICD-10-CM | POA: Insufficient documentation

## 2022-02-10 DIAGNOSIS — K811 Chronic cholecystitis: Secondary | ICD-10-CM | POA: Diagnosis present

## 2022-02-10 DIAGNOSIS — R7303 Prediabetes: Secondary | ICD-10-CM | POA: Insufficient documentation

## 2022-02-10 DIAGNOSIS — E119 Type 2 diabetes mellitus without complications: Secondary | ICD-10-CM | POA: Insufficient documentation

## 2022-02-10 DIAGNOSIS — G709 Myoneural disorder, unspecified: Secondary | ICD-10-CM | POA: Insufficient documentation

## 2022-02-10 DIAGNOSIS — F32A Depression, unspecified: Secondary | ICD-10-CM | POA: Insufficient documentation

## 2022-02-10 DIAGNOSIS — K219 Gastro-esophageal reflux disease without esophagitis: Secondary | ICD-10-CM | POA: Insufficient documentation

## 2022-02-10 DIAGNOSIS — K8010 Calculus of gallbladder with chronic cholecystitis without obstruction: Secondary | ICD-10-CM | POA: Insufficient documentation

## 2022-02-10 DIAGNOSIS — F419 Anxiety disorder, unspecified: Secondary | ICD-10-CM | POA: Insufficient documentation

## 2022-02-10 HISTORY — PX: CHOLECYSTECTOMY: SHX55

## 2022-02-10 SURGERY — LAPAROSCOPIC CHOLECYSTECTOMY
Anesthesia: General | Site: Abdomen

## 2022-02-10 MED ORDER — ROCURONIUM BROMIDE 10 MG/ML (PF) SYRINGE
PREFILLED_SYRINGE | INTRAVENOUS | Status: DC | PRN
Start: 2022-02-10 — End: 2022-02-10
  Administered 2022-02-10: 60 mg via INTRAVENOUS

## 2022-02-10 MED ORDER — ONDANSETRON HCL 4 MG/2ML IJ SOLN
INTRAMUSCULAR | Status: DC | PRN
  Administered 2022-02-10: 4 mg via INTRAVENOUS

## 2022-02-10 MED ORDER — ORAL CARE MOUTH RINSE: 15.0000 mL | Freq: Once | OROMUCOSAL | Status: AC

## 2022-02-10 MED ORDER — LIDOCAINE 2% (20 MG/ML) 5 ML SYRINGE
INTRAMUSCULAR | Status: DC | PRN
  Administered 2022-02-10: 80 mg via INTRAVENOUS

## 2022-02-10 MED ORDER — MIDAZOLAM HCL 5 MG/5ML IJ SOLN
INTRAMUSCULAR | Status: DC | PRN
  Administered 2022-02-10: 2 mg via INTRAVENOUS

## 2022-02-10 MED ORDER — SODIUM CHLORIDE 0.9 % IR SOLN
Status: DC | PRN
Start: 2022-02-10 — End: 2022-02-10
  Administered 2022-02-10: 1000 mL

## 2022-02-10 MED ORDER — SCOPOLAMINE 1 MG/3DAYS TD PT72SCOPOLAMINE 1 MG/3DAYS
1.0000 | MEDICATED_PATCH | Freq: Once | TRANSDERMAL | Status: DC
Start: 2022-02-10 — End: 2022-02-10

## 2022-02-10 MED ORDER — MIDAZOLAM HCL 2 MG/2ML IJ SOLN
INTRAMUSCULAR | Status: AC
  Filled 2022-02-10: qty 2

## 2022-02-10 MED ORDER — CIPROFLOXACIN IN D5W 400 MG/200ML IV SOLN
400.0000 mg | INTRAVENOUS | Status: AC
  Administered 2022-02-10: 400 mg via INTRAVENOUS

## 2022-02-10 MED ORDER — HEMOSTATIC AGENTS (NO CHARGE) OPTIME
TOPICAL | Status: DC | PRN
Start: 2022-02-10 — End: 2022-02-10
  Administered 2022-02-10: 1 via TOPICAL

## 2022-02-10 MED ORDER — CHLORHEXIDINE GLUCONATE 0.12 % MT SOLN
15.0000 mL | Freq: Once | OROMUCOSAL | Status: AC
  Administered 2022-02-10: 15 mL via OROMUCOSAL

## 2022-02-10 MED ORDER — FENTANYL CITRATE (PF) 250 MCG/5ML IJ SOLN
INTRAMUSCULAR | Status: DC | PRN
  Administered 2022-02-10 (×2): 100 ug via INTRAVENOUS
  Administered 2022-02-10: 50 ug via INTRAVENOUS

## 2022-02-10 MED ORDER — LACTATED RINGERS IV SOLN
INTRAVENOUS | Status: DC
  Administered 2022-02-10: 1000 mL via INTRAVENOUS

## 2022-02-10 MED ORDER — PROPOFOL 10 MG/ML IV BOLUS
INTRAVENOUS | Status: AC
  Filled 2022-02-10: qty 20

## 2022-02-10 MED ORDER — MEPERIDINE HCL 50 MG/ML IJ SOLN: 6.2500 mg | INTRAMUSCULAR | Status: DC | PRN

## 2022-02-10 MED ORDER — PROPOFOL 10 MG/ML IV BOLUS
INTRAVENOUS | Status: DC | PRN
  Administered 2022-02-10: 160 mg via INTRAVENOUS

## 2022-02-10 MED ORDER — BUPIVACAINE HCL (PF) 0.5 % IJ SOLN
INTRAMUSCULAR | Status: AC
  Filled 2022-02-10: qty 30

## 2022-02-10 MED ORDER — LIDOCAINE HCL (PF) 2 % IJ SOLN
INTRAMUSCULAR | Status: AC
  Filled 2022-02-10: qty 20

## 2022-02-10 MED ORDER — CHLORHEXIDINE GLUCONATE CLOTH 2 % EX PADS: 6.0000 | MEDICATED_PAD | Freq: Once | CUTANEOUS | Status: DC

## 2022-02-10 MED ORDER — SUGAMMADEX SODIUM 200 MG/2ML IV SOLN
INTRAVENOUS | Status: DC | PRN
Start: 2022-02-10 — End: 2022-02-10
  Administered 2022-02-10: 200 mg via INTRAVENOUS

## 2022-02-10 MED ORDER — OXYCODONE HCL 5 MG PO TABS
5.0000 mg | ORAL_TABLET | ORAL | 0 refills | Status: DC | PRN
Start: 2022-02-10 — End: 2022-08-18

## 2022-02-10 MED ORDER — HYDROMORPHONE HCL 1 MG/ML IJ SOLN: 0.2500 mg | INTRAMUSCULAR | Status: DC | PRN

## 2022-02-10 MED ORDER — FENTANYL CITRATE (PF) 250 MCG/5ML IJ SOLN
INTRAMUSCULAR | Status: AC
  Filled 2022-02-10: qty 5

## 2022-02-10 MED ORDER — ONDANSETRON HCL 4 MG/2ML IJ SOLN: 4.0000 mg | Freq: Once | INTRAMUSCULAR | Status: DC | PRN

## 2022-02-10 MED ORDER — SCOPOLAMINE 1 MG/3DAYS TD PT72
MEDICATED_PATCH | TRANSDERMAL | Status: AC
  Administered 2022-02-10: 1.5 mg via TRANSDERMAL
  Filled 2022-02-10: qty 1

## 2022-02-10 MED ORDER — CIPROFLOXACIN IN D5W 400 MG/200ML IV SOLN
INTRAVENOUS | Status: AC
  Filled 2022-02-10: qty 200

## 2022-02-10 MED ORDER — ONDANSETRON HCL 4 MG PO TABS: 4.0000 mg | ORAL_TABLET | Freq: Three times a day (TID) | ORAL | 1 refills | Status: DC | PRN

## 2022-02-10 MED ORDER — BUPIVACAINE HCL (PF) 0.5 % IJ SOLN
INTRAMUSCULAR | Status: DC | PRN
  Administered 2022-02-10: 10 mL

## 2022-02-10 MED ORDER — DEXAMETHASONE SODIUM PHOSPHATE 10 MG/ML IJ SOLN
INTRAMUSCULAR | Status: DC | PRN
  Administered 2022-02-10: 10 mg via INTRAVENOUS

## 2022-02-10 SURGICAL SUPPLY — 45 items
ADH SKN CLS APL DERMABOND .7 (GAUZE/BANDAGES/DRESSINGS) ×1
APL PRP STRL LF DISP 70% ISPRP (MISCELLANEOUS) ×1
APPLIER CLIP ROT 10 11.4 M/L (STAPLE) ×2
APR CLP MED LRG 11.4X10 (STAPLE) ×1
BAG RETRIEVAL 10 (BASKET) ×1
BLADE SURG 15 STRL LF DISP TIS (BLADE) ×1 IMPLANT
BLADE SURG 15 STRL SS (BLADE) ×2
CHLORAPREP W/TINT 26 (MISCELLANEOUS) ×2 IMPLANT
CLIP APPLIE ROT 10 11.4 M/L (STAPLE) ×1 IMPLANT
CLOTH BEACON ORANGE TIMEOUT ST (SAFETY) ×2 IMPLANT
COVER LIGHT HANDLE STERIS (MISCELLANEOUS) ×4 IMPLANT
DECANTER SPIKE VIAL GLASS SM (MISCELLANEOUS) ×2 IMPLANT
DERMABOND ADVANCED (GAUZE/BANDAGES/DRESSINGS) ×1
DERMABOND ADVANCED .7 DNX12 (GAUZE/BANDAGES/DRESSINGS) ×1 IMPLANT
ELECT REM PT RETURN 9FT ADLT (ELECTROSURGICAL) ×2
ELECTRODE REM PT RTRN 9FT ADLT (ELECTROSURGICAL) ×1 IMPLANT
GAUZE 4X4 16PLY ~~LOC~~+RFID DBL (SPONGE) ×2 IMPLANT
GLOVE SURG ENC MOIS LTX SZ6.5 (GLOVE) ×2 IMPLANT
GLOVE SURG UNDER POLY LF SZ7 (GLOVE) ×8 IMPLANT
GOWN STRL REUS W/TWL LRG LVL3 (GOWN DISPOSABLE) ×6 IMPLANT
HEMOSTAT SNOW SURGICEL 2X4 (HEMOSTASIS) ×2 IMPLANT
INST SET LAPROSCOPIC AP (KITS) ×2 IMPLANT
KIT TURNOVER KIT A (KITS) ×2 IMPLANT
MANIFOLD NEPTUNE II (INSTRUMENTS) ×2 IMPLANT
NDL INSUFFLATION 14GA 120MM (NEEDLE) ×1 IMPLANT
NDL INSUFFLATION 14GA 150MM (NEEDLE) IMPLANT
NEEDLE INSUFFLATION 14GA 120MM (NEEDLE) ×2 IMPLANT
NEEDLE INSUFFLATION 14GA 150MM (NEEDLE) ×2 IMPLANT
NS IRRIG 1000ML POUR BTL (IV SOLUTION) ×2 IMPLANT
PACK LAP CHOLE LZT030E (CUSTOM PROCEDURE TRAY) ×2 IMPLANT
PAD ARMBOARD 7.5X6 YLW CONV (MISCELLANEOUS) ×2 IMPLANT
SET BASIN LINEN APH (SET/KITS/TRAYS/PACK) ×2 IMPLANT
SET TUBE SMOKE EVAC HIGH FLOW (TUBING) ×2 IMPLANT
SLEEVE ENDOPATH XCEL 5M (ENDOMECHANICALS) ×2 IMPLANT
SUT MNCRL AB 4-0 PS2 18 (SUTURE) ×4 IMPLANT
SUT VICRYL 0 UR6 27IN ABS (SUTURE) ×2 IMPLANT
SYS BAG RETRIEVAL 10MM (BASKET) ×1
SYSTEM BAG RETRIEVAL 10MM (BASKET) ×1 IMPLANT
TROCAR ENDO BLADELESS 11MM (ENDOMECHANICALS) ×2 IMPLANT
TROCAR XCEL NON-BLD 5MMX100MML (ENDOMECHANICALS) ×2 IMPLANT
TROCAR XCEL UNIV SLVE 11M 100M (ENDOMECHANICALS) ×2 IMPLANT
TROCAR Z-THRD FIOS HNDL 11X100 (TROCAR) ×2 IMPLANT
TROCAR Z-THREAD FIOS 5X100MM (TROCAR) ×2 IMPLANT
TUBE CONNECTING 12X1/4 (SUCTIONS) ×2 IMPLANT
WARMER LAPAROSCOPE (MISCELLANEOUS) ×2 IMPLANT

## 2022-02-10 NOTE — Op Note (Signed)
Operative Note   Preoperative Diagnosis: Chronic cholecystitis    Postoperative Diagnosis: Same   Procedure(s) Performed: Laparoscopic cholecystectomy   Surgeon: Ria Comment C. Constance Haw, MD   Assistants: Aviva Signs , MD    Anesthesia: General endotracheal   Anesthesiologist: Denese Killings, MD    Specimens: Gallbladder    Estimated Blood Loss: Minimal    Blood Replacement: None    Complications: None    Operative Findings: Gallbladder with inflammation and stones    Procedure: The patient was taken to the operating room and placed supine. General endotracheal anesthesia was induced. Intravenous antibiotics were administered per protocol. An orogastric tube positioned to decompress the stomach. The abdomen was prepared and draped in the usual sterile fashion.    A supraumbilical incision was made and a Veress technique was utilized to achieve pneumoperitoneum to 15 mmHg with carbon dioxide. A 11 mm optiview port was placed through the supraumbilical region, and a 10 mm 0-degree operative laparoscope was introduced. The area underlying the trocar and Veress needle were inspected and without evidence of injury.  Remaining trocars were placed under direct vision. Two 5 mm ports were placed in the right abdomen, between the anterior axillary and midclavicular line.  A final 11 mm port was placed through the mid-epigastrium, near the falciform ligament.    The gallbladder fundus was elevated cephalad and the infundibulum was retracted to the patient's right. The gallbladder/cystic duct junction was skeletonized. The cystic artery noted in the triangle of Calot and was also skeletonized.  We then continued liberal medial and lateral dissection until the critical view of safety was achieved.    The cystic duct and cystic artery were doubly clipped and divided. The gallbladder was then dissected from the liver bed with electrocautery. The specimen was placed in an Endopouch and was retrieved  through the epigastric site.   Final inspection revealed acceptable hemostasis. Surgical SNOW was placed in the gallbladder bed.  Trocars were removed and pneumoperitoneum was released. The epigastric and umbilical port sites were smaller than my finger tip. Skin incisions were closed with 4-0 Monocryl subcuticular sutures and Dermabond. The patient was awakened from anesthesia and extubated without complication.    Curlene Labrum, MD [facility] [address] [place], [place] [address] [phone] (office)

## 2022-02-10 NOTE — Interval H&P Note (Signed)
History and Physical Interval Note:  02/10/2022 9:17 AM  Brandi Farley  has presented today for surgery, with the diagnosis of Chronic cholecystitis.  The various methods of treatment have been discussed with the patient and family. After consideration of risks, benefits and other options for treatment, the patient has consented to  Procedure(s): LAPAROSCOPIC CHOLECYSTECTOMY (N/A) as a surgical intervention.  The patient's history has been reviewed, patient examined, no change in status, stable for surgery.  I have reviewed the patient's chart and labs.  Questions were answered to the patient's satisfaction.     Virl Cagey

## 2022-02-10 NOTE — Discharge Instructions (Signed)
Discharge Laparoscopic Surgery Instructions:  Common Complaints: Right shoulder pain is common after laparoscopic surgery. This is secondary to the gas used in the surgery being trapped under the diaphragm.  Walk to help your body absorb the gas. This will improve in a few days. Pain at the port sites are common, especially the larger port sites. This will improve with time.  Some nausea is common and poor appetite. The main goal is to stay hydrated the first few days after surgery.   Diet/ Activity: Diet as tolerated. You may not have an appetite, but it is important to stay hydrated. Drink 64 ounces of water a day. Your appetite will return with time.  Shower per your regular routine daily.  Do not take hot showers. Take warm showers that are less than 10 minutes. Rest and listen to your body, but do not remain in bed all day.  Walk everyday for at least 15-20 minutes. Deep cough and move around every 1-2 hours in the first few days after surgery.  Do not lift > 10 lbs, perform excessive bending, pushing, pulling, squatting for 1-2 weeks after surgery.  Do not pick at the dermabond glue on your incision sites.  This glue film will remain in place for 1-2 weeks and will start to peel off.  Do not place lotions or balms on your incision unless instructed to specifically by Dr. Shameer Molstad.   Pain Expectations and Narcotics: -After surgery you will have pain associated with your incisions and this is normal. The pain is muscular and nerve pain, and will get better with time. -You are encouraged and expected to take non narcotic medications like tylenol and ibuprofen (when able) to treat pain as multiple modalities can aid with pain treatment. -Narcotics are only used when pain is severe or there is breakthrough pain. -You are not expected to have a pain score of 0 after surgery, as we cannot prevent pain. A pain score of 3-4 that allows you to be functional, move, walk, and tolerate some activity is  the goal. The pain will continue to improve over the days after surgery and is dependent on your surgery. -Due to [place] law, we are only able to give a certain amount of pain medication to treat post operative pain, and we only give additional narcotics on a patient by patient basis.  -For most laparoscopic surgery, studies have shown that the majority of patients only need 10-15 narcotic pills, and for open surgeries most patients only need 15-20.   -Having appropriate expectations of pain and knowledge of pain management with non narcotics is important as we do not want anyone to become addicted to narcotic pain medication.  -Using ice packs in the first 48 hours and heating pads after 48 hours, wearing an abdominal binder (when recommended), and using over the counter medications are all ways to help with pain management.   -Simple acts like meditation and mindfulness practices after surgery can also help with pain control and research has proven the benefit of these practices.  Medication: Take tylenol and ibuprofen as needed for pain control, alternating every 4-6 hours.  Example:  Tylenol 1000mg @ 6am, 12noon, 6pm, 12midnight (Do not exceed 4000mg of tylenol a day). Ibuprofen 800mg @ 9am, 3pm, 9pm, 3am (Do not exceed 3600mg of ibuprofen a day).  Take Roxicodone for breakthrough pain every 4 hours.  Take Colace for constipation related to narcotic pain medication. If you do not have a bowel movement in 2 days, take Miralax   over the counter.  Drink plenty of water to also prevent constipation.   Contact Information: If you have questions or concerns, please call our office, [phone], Monday- Thursday 8AM-5PM and Friday 8AM-12Noon.  If it is after hours or on the weekend, please call [facility] Main [facility], [phone], [phone], and ask to speak to the surgeon on call for Dr. Evan Osburn at [place].   

## 2022-02-10 NOTE — Anesthesia Procedure Notes (Signed)
Procedure Name: Intubation Date/Time: 02/10/2022 9:53 AM Performed by: Myna Bright, CRNA Pre-anesthesia Checklist: Patient identified, Emergency Drugs available, Suction available and Patient being monitored Patient Re-evaluated:Patient Re-evaluated prior to induction Oxygen Delivery Method: Circle system utilized Preoxygenation: Pre-oxygenation with 100% oxygen Induction Type: IV induction Ventilation: Mask ventilation without difficulty Laryngoscope Size: Mac and 3 Grade View: Grade II Tube type: Oral Tube size: 7.0 mm Number of attempts: 1 Airway Equipment and Method: Stylet Placement Confirmation: ETT inserted through vocal cords under direct vision, positive ETCO2 and breath sounds checked- equal and bilateral Secured at: 21 cm Tube secured with: Tape Dental Injury: Teeth and Oropharynx as per pre-operative assessment

## 2022-02-10 NOTE — Progress Notes (Signed)
[facility]  Updated daughter. Rx to [employer]. Will plan for 3/7 phone call post op unless patient prefers in person.  Curlene Labrum, MD [facility] [address] [place], [place] [address] [phone] (office)

## 2022-02-10 NOTE — Transfer of Care (Signed)
Immediate Anesthesia Transfer of Care Note  Patient: Brandi Farley  Procedure(s) Performed: LAPAROSCOPIC CHOLECYSTECTOMY (Abdomen)  Patient Location: PACU  Anesthesia Type:General  Level of Consciousness: sedated, patient cooperative and responds to stimulation  Airway & Oxygen Therapy: Patient Spontanous Breathing and Patient connected to face mask oxygen  Post-op Assessment: Report given to RN, Post -op Vital signs reviewed and stable and Patient moving all extremities  Post vital signs: Reviewed and stable  Last Vitals:  Vitals Value Taken Time  BP 124/70 02/10/22 1040  Temp    Pulse 57 02/10/22 1045  Resp 15 02/10/22 1045  SpO2 98 % 02/10/22 1045  Vitals shown include unvalidated device data.  Last Pain:  Vitals:   02/10/22 0847  TempSrc: Oral  PainSc: 0-No pain      Patients Stated Pain Goal: 5 (34/74/25 9563)  Complications: No notable events documented.

## 2022-02-10 NOTE — Anesthesia Postprocedure Evaluation (Signed)
Anesthesia Post Note  Patient: Brandi Farley  Procedure(s) Performed: LAPAROSCOPIC CHOLECYSTECTOMY (Abdomen)  Patient location during evaluation: Phase II Anesthesia Type: General Level of consciousness: awake and alert and oriented Pain management: pain level controlled Vital Signs Assessment: post-procedure vital signs reviewed and stable Respiratory status: spontaneous breathing, nonlabored ventilation and respiratory function stable Cardiovascular status: blood pressure returned to baseline and stable Postop Assessment: no apparent nausea or vomiting Anesthetic complications: no   No notable events documented.   Last Vitals:  Vitals:   02/10/22 1130 02/10/22 1136  BP: (!) 135/92 127/81  Pulse: 72 65  Resp: 20 18  Temp:  36.7 C  SpO2: 100% 95%    Last Pain:  Vitals:   02/10/22 1136  TempSrc: Oral  PainSc: 4                  Katrianna Friesenhahn C Keeghan Bialy

## 2022-02-10 NOTE — Anesthesia Preprocedure Evaluation (Addendum)
Anesthesia Evaluation  Patient identified by MRN, date of birth, ID band Patient awake    Reviewed: Allergy & Precautions, NPO status , Patient's Chart, lab work & pertinent test results  History of Anesthesia Complications (+) history of anesthetic complications  Airway Mallampati: II  TM Distance: >3 FB Neck ROM: Full    Dental  (+) Dental Advisory Given No notable dental injury:   Pulmonary neg pulmonary ROS, former smoker,    Pulmonary exam normal breath sounds clear to auscultation       Cardiovascular Exercise Tolerance: Good Normal cardiovascular exam+ Valvular Problems/Murmurs  Rhythm:Regular Rate:Normal     Neuro/Psych  Headaches, PSYCHIATRIC DISORDERS Anxiety Depression  Neuromuscular disease    GI/Hepatic Neg liver ROS, GERD  Controlled and Medicated,  Endo/Other  diabetes  Renal/GU Renal disease  negative genitourinary   Musculoskeletal  (+) Arthritis , Osteoarthritis,    Abdominal   Peds negative pediatric ROS (+)  Hematology negative hematology ROS (+)   Anesthesia Other Findings   Reproductive/Obstetrics negative OB ROS                            Anesthesia Physical Anesthesia Plan  ASA: 2  Anesthesia Plan: General   Post-op Pain Management: Dilaudid IV   Induction: Intravenous  PONV Risk Score and Plan: 4 or greater and Ondansetron, Dexamethasone and Midazolam  Airway Management Planned: Oral ETT  Additional Equipment:   Intra-op Plan:   Post-operative Plan: Extubation in OR  Informed Consent: I have reviewed the patients History and Physical, chart, labs and discussed the procedure including the risks, benefits and alternatives for the proposed anesthesia with the patient or authorized representative who has indicated his/her understanding and acceptance.     Dental advisory given  Plan Discussed with: CRNA and Surgeon  Anesthesia Plan Comments:          Anesthesia Quick Evaluation

## 2022-02-11 LAB — SURGICAL PATHOLOGY

## 2022-02-13 ENCOUNTER — Telehealth (INDEPENDENT_AMBULATORY_CARE_PROVIDER_SITE_OTHER): Payer: BC Managed Care – PPO | Admitting: General Surgery

## 2022-02-13 DIAGNOSIS — L231 Allergic contact dermatitis due to adhesives: Secondary | ICD-10-CM

## 2022-02-13 MED ORDER — PREDNISONE 10 MG (21) PO TBPK: ORAL_TABLET | ORAL | 0 refills | Status: DC

## 2022-02-13 NOTE — Telephone Encounter (Signed)
[facility]  Switch board told patient they could not call me. Patient called her PCP and the PCP got in touch with me.  Patient had laparoscopic cholecystectomy 02/10/22. She is having contact dermatitis around the skin but not at the dermabond it is surrounding the area.   She has a L shape in the shape of what sounds like the adhesive from the tapes from the drapes.   This was getting worse over the week since her surgery. Benadryl is not working.   She is otherwise doing well and having Bms and walking.   Prednisone taper pack sent to Rx.  Office will check on her on Monday.  Contacting the switch board to ensure they know I am on call and will contact me with patient concerns.  Curlene Labrum, MD [facility] [address] [place], [place] [address] [phone] (office)

## 2022-02-15 NOTE — Telephone Encounter (Signed)
Received call from patient.   Reports that irritation from adhesive is improving with Prednisone.   Also reports that she has slight nausea. States that she did eat spicy meal last night, and took Zofran as prescribed. Advised to eat small bland meals at first and then advance diet as tolerated.   Also states that she has been having slight chills, but no fever. Oral temperature remains 97.9- 98.3, which is patient baseline. Denies pain at incision sites. Advised to continue to monitor temperature.  Dr. Constance Haw to be made aware.

## 2022-02-17 ENCOUNTER — Encounter (HOSPITAL_COMMUNITY): Payer: Self-pay | Admitting: General Surgery

## 2022-02-23 ENCOUNTER — Other Ambulatory Visit: Payer: Self-pay

## 2022-02-23 ENCOUNTER — Ambulatory Visit (INDEPENDENT_AMBULATORY_CARE_PROVIDER_SITE_OTHER): Payer: BC Managed Care – PPO | Admitting: General Surgery

## 2022-02-23 DIAGNOSIS — K811 Chronic cholecystitis: Secondary | ICD-10-CM

## 2022-02-23 DIAGNOSIS — L231 Allergic contact dermatitis due to adhesives: Secondary | ICD-10-CM

## 2022-02-23 NOTE — Progress Notes (Signed)
[facility] ? ?I am calling the patient for post operative evaluation. This is not a billable encounter as it is under the [place] charges for the surgery.  The patient had a laparoscopic cholecystectomy on 02/10/22. The patient reports that she is doing well and her rash from the sterile drape adhesive is better. The are tolerating a diet, having good pain control, and having regular Bms.  The incisions are healing without issues,glue is peeling up slowly. The patient has no concerns. She finished her steroid taper from the sterile drape adhesive.  ? ?Pathology: ?FINAL MICROSCOPIC DIAGNOSIS:  ? ?A. GALLBLADDER, CHOLECYSTECTOMY:  ?- Chronic calculous cholecystitis.  No dysplasia or malignancy.  ? ?Will see the patient PRN.  ? ?Curlene Labrum, MD ?[facility] ?[place], [place] [address] ?[phone] (office)  ?

## 2022-07-14 ENCOUNTER — Emergency Department (HOSPITAL_COMMUNITY)
Admission: EM | Admit: 2022-07-14 | Discharge: 2022-07-14 | Disposition: A | Discharge status: Home / Self Care | Payer: BC Managed Care – PPO | Attending: Emergency Medicine | Admitting: Emergency Medicine

## 2022-07-14 ENCOUNTER — Encounter (HOSPITAL_COMMUNITY): Payer: Self-pay | Admitting: Emergency Medicine

## 2022-07-14 ENCOUNTER — Other Ambulatory Visit: Payer: Self-pay

## 2022-07-14 ENCOUNTER — Emergency Department (HOSPITAL_COMMUNITY): Payer: BC Managed Care – PPO

## 2022-07-14 DIAGNOSIS — K5732 Diverticulitis of large intestine without perforation or abscess without bleeding: Secondary | ICD-10-CM | POA: Insufficient documentation

## 2022-07-14 DIAGNOSIS — R1032 Left lower quadrant pain: Secondary | ICD-10-CM | POA: Diagnosis present

## 2022-07-14 DIAGNOSIS — K5792 Diverticulitis of intestine, part unspecified, without perforation or abscess without bleeding: Secondary | ICD-10-CM

## 2022-07-14 LAB — COMPREHENSIVE METABOLIC PANEL
ALT: 20 U/L (ref 0–44)
AST: 20 U/L (ref 15–41)
Albumin: 3.7 g/dL (ref 3.5–5.0)
Alkaline Phosphatase: 81 U/L (ref 38–126)
Anion gap: 6 (ref 5–15)
BUN: 10 mg/dL (ref 6–20)
CO2: 25 mmol/L (ref 22–32)
Calcium: 8.9 mg/dL (ref 8.9–10.3)
Chloride: 107 mmol/L (ref 98–111)
Creatinine, Ser: 0.59 mg/dL (ref 0.44–1.00)
GFR, Estimated: >60 mL/min (ref >60)
Glucose, Bld: 115 mg/dL — ABNORMAL HIGH (ref 70–99)
Potassium: 3.7 mmol/L (ref 3.5–5.1)
Sodium: 138 mmol/L (ref 135–145)
Total Bilirubin: 0.7 mg/dL (ref 0.3–1.2)
Total Protein: 7.3 g/dL (ref 6.5–8.1)

## 2022-07-14 LAB — CBC WITH DIFFERENTIAL/PLATELET
Abs Immature Granulocytes: 0.03 10*3/uL (ref 0.00–0.07)
Basophils Absolute: 0.1 10*3/uL (ref 0.0–0.1)
Basophils Relative: 0 %
Eosinophils Absolute: 0.1 10*3/uL (ref 0.0–0.5)
Eosinophils Relative: 1 %
HCT: 41.7 % (ref 36.0–46.0)
Hemoglobin: 13.8 g/dL (ref 12.0–15.0)
Immature Granulocytes: 0 %
Lymphocytes Relative: 13 %
Lymphs Abs: 1.8 10*3/uL (ref 0.7–4.0)
MCH: 28.5 pg (ref 26.0–34.0)
MCHC: 33.1 g/dL (ref 30.0–36.0)
MCV: 86 fL (ref 80.0–100.0)
Monocytes Absolute: 0.9 10*3/uL (ref 0.1–1.0)
Monocytes Relative: 7 %
Neutro Abs: 10.7 10*3/uL — ABNORMAL HIGH (ref 1.7–7.7)
Neutrophils Relative %: 79 %
Platelets: 341 10*3/uL (ref 150–400)
RBC: 4.85 MIL/uL (ref 3.87–5.11)
RDW: 14 % (ref 11.5–15.5)
WBC: 13.7 10*3/uL — ABNORMAL HIGH (ref 4.0–10.5)
nRBC: 0 % (ref 0.0–0.2)

## 2022-07-14 MED ORDER — METRONIDAZOLE 500 MG PO TABS: 500.0000 mg | ORAL_TABLET | Freq: Four times a day (QID) | ORAL | 0 refills | Status: DC

## 2022-07-14 MED ORDER — CIPROFLOXACIN IN D5W 400 MG/200ML IV SOLN
400.0000 mg | Freq: Once | INTRAVENOUS | Status: AC
  Administered 2022-07-14: 400 mg via INTRAVENOUS
  Filled 2022-07-14: qty 200

## 2022-07-14 MED ORDER — IOHEXOL 300 MG/ML  SOLN
100.0000 mL | Freq: Once | INTRAMUSCULAR | Status: AC | PRN
  Administered 2022-07-14: 100 mL via INTRAVENOUS

## 2022-07-14 MED ORDER — SODIUM CHLORIDE 0.9 % IV BOLUS
1000.0000 mL | Freq: Once | INTRAVENOUS | Status: AC
  Administered 2022-07-14: 1000 mL via INTRAVENOUS

## 2022-07-14 MED ORDER — METRONIDAZOLE 500 MG/100ML IV SOLN
500.0000 mg | Freq: Once | INTRAVENOUS | Status: AC
  Administered 2022-07-14: 500 mg via INTRAVENOUS
  Filled 2022-07-14: qty 100

## 2022-07-14 MED ORDER — CIPROFLOXACIN HCL 500 MG PO TABS: 500.0000 mg | ORAL_TABLET | Freq: Two times a day (BID) | ORAL | 0 refills | Status: DC

## 2022-07-14 NOTE — ED Provider Notes (Signed)
[facility] EMERGENCY DEPARTMENT Provider Note   CSN: [id number] Arrival date & time: 07/14/22  0762     History {Add pertinent medical, surgical, social history, OB history to HPI:1} Chief Complaint  Patient presents with  . Melena    Brandi Farley is a 61 y.o. female.   Abdominal Pain      Home Medications Prior to Admission medications   Medication Sig Start Date End Date Taking? Authorizing Provider  aspirin-acetaminophen-caffeine (EXCEDRIN MIGRAINE) [phone] MG tablet Take 2 tablets by mouth every 6 (six) hours as needed for migraine.   Yes [provider]  cetirizine (ZYRTEC) 10 MG tablet Take 10 mg by mouth at bedtime.   Yes [provider]  ciprofloxacin (CIPRO) 500 MG tablet Take 1 tablet (500 mg total) by mouth 2 (two) times daily. One po bid x 7 days 07/14/22  Yes Milton Ferguson, MD  EPINEPHrine 0.3 mg/0.3 mL IJ SOAJ injection Inject 0.3 mg into the muscle as needed for anaphylaxis.   Yes [provider]  Esomeprazole Magnesium (NEXIUM 24HR PO) Take 1 tablet by mouth daily as needed (Settle bad stomach). 12/05/19  Yes [provider]  loperamide (IMODIUM) 2 MG capsule Take 2 mg by mouth daily as needed for diarrhea or loose stools. 02/02/22  Yes [provider]  metroNIDAZOLE (FLAGYL) 500 MG tablet Take 1 tablet (500 mg total) by mouth 4 (four) times daily. 07/14/22  Yes Milton Ferguson, MD  mometasone (NASONEX) 50 MCG/ACT nasal spray Place 1 spray into the nose daily as needed for allergies. 02/03/22  Yes [provider]  montelukast (SINGULAIR) 10 MG tablet TAKE 1 TABLET BY MOUTH ONCE A DAY AT BEDTIME. 03/11/20  Yes Valentina Shaggy, MD  Rimegepant Sulfate (NURTEC) 75 MG TBDP Take 75 mg by mouth daily as needed (migraine).   Yes [provider]  sertraline (ZOLOFT) 25 MG tablet Take 25 mg by mouth daily.   Yes [provider]  clobetasol ointment (TEMOVATE) 0.05 % Apply topically nightly for  the next six weeks. Then decrease to nightly, two nights a week for another two weeks.  Than stop.  With a flare, you can increase to twice daily for up to 7 days. Patient not taking: Reported on 07/14/2022 01/05/19   Megan Salon, MD  ondansetron (ZOFRAN) 4 MG tablet Take 1 tablet (4 mg total) by mouth every 8 (eight) hours as needed. Patient not taking: Reported on 07/14/2022 02/10/22 02/10/23  Virl Cagey, MD  oxyCODONE (ROXICODONE) 5 MG immediate release tablet Take 1 tablet (5 mg total) by mouth every 4 (four) hours as needed for severe pain or breakthrough pain. Patient not taking: Reported on 07/14/2022 02/10/22 02/10/23  Virl Cagey, MD  predniSONE (STERAPRED UNI-PAK 21 TAB) 10 MG (21) TBPK tablet 21 tablet prednisone pack taper Patient not taking: Reported on 07/14/2022 02/13/22   Virl Cagey, MD  Vitamin D, Ergocalciferol, (DRISDOL) 1.25 MG (50000 UNIT) CAPS capsule Take 50,000 Units by mouth once a week. 07/16/20   [provider]      Allergies    Bactrim [sulfamethoxazole-trimethoprim], Doryx [doxycycline], Keflex [cephalexin], Alpha-gal, Other, Wound dressing adhesive, and Sincalide    Review of Systems   Review of Systems  Gastrointestinal:  Positive for abdominal pain.    Physical Exam Updated Vital Signs BP 128/73   Pulse 66   Temp 98 F (36.7 C) (Oral)   Resp 19   Ht '5\' 7"'$  (1.702 m)   Wt 103.4 kg  LMP 11/10/2018   SpO2 97%   BMI 35.71 kg/m  Physical Exam  ED Results / Procedures / Treatments   Labs (all labs ordered are listed, but only abnormal results are displayed) Labs Reviewed  CBC WITH DIFFERENTIAL/PLATELET - Abnormal; Notable for the following components:      Result Value   WBC 13.7 (*)    Neutro Abs 10.7 (*)    All other components within normal limits  COMPREHENSIVE METABOLIC PANEL - Abnormal; Notable for the following components:   Glucose, Bld 115 (*)    All other components within normal limits     EKG None  Radiology CT ABDOMEN PELVIS W CONTRAST  Result Date: 07/14/2022 CLINICAL DATA:  Abdominal pain EXAM: CT ABDOMEN AND PELVIS WITH CONTRAST TECHNIQUE: Multidetector CT imaging of the abdomen and pelvis was performed using the standard protocol following bolus administration of intravenous contrast. RADIATION DOSE REDUCTION: This exam was performed according to the departmental dose-optimization program which includes automated exposure control, adjustment of the mA and/or kV according to patient size and/or use of iterative reconstruction technique. CONTRAST:  165m OMNIPAQUE IOHEXOL 300 MG/ML  SOLN COMPARISON:  Pelvic ultrasound dated September 18, 2020 FINDINGS: Lower chest: No acute abnormality. Hepatobiliary: No focal liver abnormality is seen. Status post cholecystectomy. No biliary dilatation. Pancreas: Unremarkable. No pancreatic ductal dilatation or surrounding inflammatory changes. Spleen: Normal in size without focal abnormality. Adrenals/Urinary Tract: Bilateral adrenal glands are unremarkable. No hydronephrosis or nephrolithiasis. No suspicious renal lesions. Bladder is unremarkable. Stomach/Bowel: Wall thickening of the sigmoid colon with adjacent inflammatory change and associated inflamed diverticula. Diverticulosis. No evidence of extraluminal gas or intra-abdominal fluid collection. Small hiatal hernia. Stomach is otherwise unremarkable. Appendix is surgically absent. No evidence of obstruction. Vascular/Lymphatic: Aortic atherosclerosis. No enlarged abdominal or pelvic lymph nodes. Reproductive: Uterus is unremarkable. Left adnexal cyst measuring 3.4 cm on series 2 image 76. Other: Well-circumscribed subcutaneous cystic lesion of the right chest wall measuring 3.8 x 2.7 cm. No abdominopelvic ascites. Musculoskeletal: No acute or significant osseous findings. IMPRESSION: 1. Findings compatible with acute uncomplicated diverticulitis. 2. Left adnexal cyst measuring 3.4 cm,  previously seen on prior pelvic ultrasound dated September 18, 2020 and possibly increased in size. Recommend nonemergent pelvic ultrasound for further evaluation. 3. Well-circumscribed subcutaneous cystic lesion of the right chest wall. Recommend clinical correlation. 4.  Aortic Atherosclerosis (ICD10-I70.0). Electronically Signed   By: LYetta GlassmanM.D.   On: 07/14/2022 09:19    Procedures Procedures  {Document cardiac monitor, telemetry assessment procedure when appropriate:1}  Medications Ordered in ED Medications  ciprofloxacin (CIPRO) IVPB 400 mg (400 mg Intravenous New Bag/Given 07/14/22 1052)  metroNIDAZOLE (FLAGYL) IVPB 500 mg (500 mg Intravenous New Bag/Given 07/14/22 1051)  sodium chloride 0.9 % bolus 1,000 mL (0 mLs Intravenous Stopped 07/14/22 1052)  iohexol (OMNIPAQUE) 300 MG/ML solution 100 mL (100 mLs Intravenous Contrast Given 07/14/22 0847)    ED Course/ Medical Decision Making/ A&P                           Medical Decision Making Amount and/or Complexity of Data Reviewed Labs: ordered. Radiology: ordered.  Risk Prescription drug management.   ***  {Document critical care time when appropriate:1} {Document review of labs and clinical decision tools ie heart score, Chads2Vasc2 etc:1}  {Document your independent review of radiology images, and any outside records:1} {Document your discussion with family members, caretakers, and with consultants:1} {Document social determinants of health affecting pt's care:1} {Document your  decision making why or why not admission, treatments were needed:1} Final Clinical Impression(s) / ED Diagnoses Final diagnoses:  Diverticulitis    Rx / DC Orders ED Discharge Orders          Ordered    ciprofloxacin (CIPRO) 500 MG tablet  2 times daily        07/14/22 1118    metroNIDAZOLE (FLAGYL) 500 MG tablet  4 times daily        07/14/22 1118

## 2022-07-14 NOTE — Discharge Instructions (Signed)
Follow-up with your doctor as planned on Friday.  Take Tylenol for pain

## 2022-07-14 NOTE — ED Triage Notes (Signed)
Pt took advil for arthritic pain, causing diarrhea. Pt took pepto then reported stomach cramping and black stool. Pt is still having diarrhea and nausea.

## 2022-07-14 NOTE — ED Notes (Addendum)
Pt resting quietly in bed.

## 2022-08-09 ENCOUNTER — Ambulatory Visit: Payer: BC Managed Care – PPO | Admitting: Gastroenterology

## 2022-08-13 ENCOUNTER — Other Ambulatory Visit: Payer: Self-pay | Admitting: Orthopedic Surgery

## 2022-08-13 DIAGNOSIS — M25561 Pain in right knee: Secondary | ICD-10-CM

## 2022-08-18 ENCOUNTER — Encounter: Payer: Self-pay | Admitting: Gastroenterology

## 2022-08-18 ENCOUNTER — Ambulatory Visit (INDEPENDENT_AMBULATORY_CARE_PROVIDER_SITE_OTHER): Payer: BC Managed Care – PPO | Admitting: Gastroenterology

## 2022-08-18 DIAGNOSIS — Z1211 Encounter for screening for malignant neoplasm of colon: Secondary | ICD-10-CM | POA: Diagnosis not present

## 2022-08-18 DIAGNOSIS — K5732 Diverticulitis of large intestine without perforation or abscess without bleeding: Secondary | ICD-10-CM | POA: Diagnosis not present

## 2022-08-18 NOTE — Patient Instructions (Addendum)
Continue with plenty of fiber in your diet and add a fiber supplement if tolerated. Benefiber has chewables and powder, goal of 3-4 grams per dose, one dose per day. Fiberchoice also has chewables. Avoid fiber tablets or capsules.  Call when you are ready to schedule your colonoscopy.  Continue with healthy eating, exercise with goal of weight reduction for management of fatty liver.    Diverticulitis  Diverticulitis is infection or inflammation of small pouches (diverticula) in the colon that form due to a condition called diverticulosis. Diverticula can trap stool (feces) and bacteria, causing infection and inflammation. Diverticulitis may cause severe stomach pain and diarrhea. It may lead to tissue damage in the colon that causes bleeding or blockage. The diverticula may also burst (rupture) and cause infected stool to enter other areas of the abdomen. What are the causes? This condition is caused by stool becoming trapped in the diverticula, which allows bacteria to grow in the diverticula. This leads to inflammation and infection. What increases the risk? You are more likely to develop this condition if you have diverticulosis. The risk increases if you: Are overweight or obese. Do not get enough exercise. Drink alcohol. Use tobacco products. Eat a diet that has a lot of red meat such as beef, pork, or lamb. Eat a diet that does not include enough fiber. High-fiber foods include fruits, vegetables, beans, nuts, and whole grains. Are over 101 years of age. What are the signs or symptoms? Symptoms of this condition may include: Pain and tenderness in the abdomen. The pain is normally located on the left side of the abdomen, but it may occur in other areas. Fever and chills. Nausea. Vomiting. Cramping. Bloating. Changes in bowel routines. Blood in your stool. How is this diagnosed? This condition is diagnosed based on: Your medical history. A physical exam. Tests to make sure  there is nothing else causing your condition. These tests may include: Blood tests. Urine tests. CT scan of the abdomen. How is this treated? Most cases of this condition are mild and can be treated at home. Treatment may include: Taking over-the-counter pain medicines. Following a clear liquid diet. Taking antibiotic medicines by mouth. Resting. More severe cases may need to be treated at a hospital. Treatment may include: Not eating or drinking. Taking prescription pain medicine. Receiving antibiotic medicines through an IV. Receiving fluids and nutrition through an IV. Surgery. When your condition is under control, your health care provider may recommend that you have a colonoscopy. This is an exam to look at the entire large intestine. During the exam, a lubricated, bendable tube is inserted into the anus and then passed into the rectum, colon, and other parts of the large intestine. A colonoscopy can show how severe your diverticula are and whether something else may be causing your symptoms. Follow these instructions at home: Medicines Take over-the-counter and prescription medicines only as told by your health care provider. These include fiber supplements, probiotics, and stool softeners. If you were prescribed an antibiotic medicine, take it as told by your health care provider. Do not stop taking the antibiotic even if you start to feel better. Ask your health care provider if the medicine prescribed to you requires you to avoid driving or using machinery. Eating and drinking  Follow a full liquid diet or another diet as directed by your health care provider. After your symptoms improve, your health care provider may tell you to change your diet. He or she may recommend that you eat a diet  that contains at least 25 grams (25 g) of fiber daily. Fiber makes it easier to pass stool. Healthy sources of fiber include: Berries. One cup contains 4-8 grams of fiber. Beans or lentils.  One-half cup contains 5-8 grams of fiber. Green vegetables. One cup contains 4 grams of fiber. Avoid eating red meat. General instructions Do not use any products that contain nicotine or tobacco, such as cigarettes, e-cigarettes, and chewing tobacco. If you need help quitting, ask your health care provider. Exercise for at least 30 minutes, 3 times each week. You should exercise hard enough to raise your heart rate and break a sweat. Keep all follow-up visits as told by your health care provider. This is important. You may need to have a colonoscopy. Contact a health care provider if: Your pain does not improve. Your bowel movements do not return to normal. Get help right away if: Your pain gets worse. Your symptoms do not get better with treatment. Your symptoms suddenly get worse. You have a fever. You vomit more than one time. You have stools that are bloody, black, or tarry. Summary Diverticulitis is infection or inflammation of small pouches (diverticula) in the colon that form due to a condition called diverticulosis. Diverticula can trap stool (feces) and bacteria, causing infection and inflammation. You are at higher risk for this condition if you have diverticulosis and you eat a diet that does not include enough fiber. Most cases of this condition are mild and can be treated at home. More severe cases may need to be treated at a hospital. When your condition is under control, your health care provider may recommend that you have an exam called a colonoscopy. This exam can show how severe your diverticula are and whether something else may be causing your symptoms. Keep all follow-up visits as told by your health care provider. This is important. This information is not intended to replace advice given to you by your health care provider. Make sure you discuss any questions you have with your health care provider. Document Revised: 09/17/2019 Document Reviewed: 09/17/2019 Elsevier  Patient Education  [place].    Fatty Liver Disease  The liver converts food into energy, removes toxic material from the blood, makes important proteins, and absorbs necessary vitamins from food. Fatty liver disease occurs when too much fat has built up in your liver cells. Fatty liver disease is also called hepatic steatosis. In many cases, fatty liver disease does not cause symptoms or problems. It is often diagnosed when tests are being done for other reasons. However, over time, fatty liver can cause inflammation that may lead to more serious liver problems, such as scarring of the liver (cirrhosis) and liver failure. Fatty liver is associated with insulin resistance, increased body fat, high blood pressure (hypertension), and high cholesterol. These are features of metabolic syndrome and increase your risk for stroke, diabetes, and heart disease. What are the causes? This condition may be caused by components of metabolic syndrome: Obesity. Insulin resistance. High cholesterol. Other causes: Alcohol abuse. Poor nutrition. Cushing syndrome. Pregnancy. Certain drugs. Poisons. Some viral infections. What increases the risk? You are more likely to develop this condition if you: Abuse alcohol. Are overweight. Have diabetes. Have hepatitis. Have a high triglyceride level. Are pregnant. What are the signs or symptoms? Fatty liver disease often does not cause symptoms. If symptoms do develop, they can include: Fatigue and weakness. Weight loss. Confusion. Nausea, vomiting, or abdominal pain. Yellowing of your skin and the white parts  of your eyes (jaundice). Itchy skin. How is this diagnosed? This condition may be diagnosed by: A physical exam and your medical history. Blood tests. Imaging tests, such as an ultrasound, CT scan, or MRI. A liver biopsy. A small sample of liver tissue is removed using a needle. The sample is then looked at under a microscope. How is  this treated? Fatty liver disease is often caused by other health conditions. Treatment for fatty liver may involve medicines and lifestyle changes to manage conditions such as: Alcoholism. High cholesterol. Diabetes. Being overweight or obese. Follow these instructions at home:  Do not drink alcohol. If you have trouble quitting, ask your health care provider how to safely quit with the help of medicine or a supervised program. This is important to keep your condition from getting worse. Eat a healthy diet as told by your health care provider. Ask your health care provider about working with a dietitian to develop an eating plan. Exercise regularly. This can help you lose weight and control your cholesterol and diabetes. Talk to your health care provider about an exercise plan and which activities are best for you. Take over-the-counter and prescription medicines only as told by your health care provider. Keep all follow-up visits. This is important. Contact a health care provider if: You have trouble controlling your: Blood sugar. This is especially important if you have diabetes. Cholesterol. Drinking of alcohol. Get help right away if: You have abdominal pain. You have jaundice. You have nausea and are vomiting. You vomit blood or material that looks like coffee grounds. You have stools that are black, tar-like, or bloody. Summary Fatty liver disease develops when too much fat builds up in the cells of your liver. Fatty liver disease often causes no symptoms or problems. However, over time, fatty liver can cause inflammation that may lead to more serious liver problems, such as scarring of the liver (cirrhosis). You are more likely to develop this condition if you abuse alcohol, are pregnant, are overweight, have diabetes, have hepatitis, or have high triglyceride or cholesterol levels. Contact your health care provider if you have trouble controlling your blood sugar, cholesterol, or  drinking of alcohol. This information is not intended to replace advice given to you by your health care provider. Make sure you discuss any questions you have with your health care provider. Document Revised: 09/18/2020 Document Reviewed: 09/18/2020 Elsevier Patient Education  [place].

## 2022-08-18 NOTE — Progress Notes (Signed)
GI Office Note    Referring Provider: Celene Squibb, MD Primary Care Physician:  Celene Squibb, MD  Primary Gastroenterologist:  Chief Complaint   Chief Complaint  Patient presents with   ER Follow up    Had an issue with diverticulitis, states that everything is better now. Pt states that she is due for a colonoscopy. Last colonoscopy was done by Dr. Laural Golden 03/03/2010.     History of Present Illness   Brandi Farley is a 61 y.o. female presenting today referred by the ED provider for follow up of abdominal pain, black stool.   Patient was seen in the ED recently for two day history of abdominal pain and loose stool. She reports her stools turned black. She was using Pepto. CT with findings of uncomplicated sigmoid diverticulitis.  CBC normal.  She was treated 7-day course of Cipro and Flagyl.  Abdominal pain has resolved.  She states she tried Metamucil a couple of different times in the past but it messed up her bowel pattern.  She alternated between constipation and diarrhea.  She tries to get adequate fiber in her diet.  At this time her bowel movements are regular.  Denies any blood in stool or melena.  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  Recently cut out her normal, believes her stomach has felt better since then.  Has occasional heartburn if she overeats.  No dysphagia or vomiting.  She has been actively trying to lose weight, weight down 18 pounds since February.   She is currently being worked up for possible knee replacement.  MRI pending, awaiting scheduling which has been delayed by the fact that she has a cochlear implant.  She would like to have a colonoscopy in the near future, possibly after her MRI.  Her last colonoscopy she reports was in 2011 with Dr. Laural Golden.  Earlier this year she had surgery for cholelithiasis/cholecystitis.  Ultrasound imaging showed fatty liver, liver appeared unremarkable CT.    In the ED July 2023: LFTs normal, creatinine 0.59, white blood  cell count 13,700, hemoglobin 13.8.  CT abdomen pelvis with contrast July 2023: IMPRESSION: 1. Findings compatible with acute uncomplicated diverticulitis. 2. Left adnexal cyst measuring 3.4 cm, previously seen on prior pelvic ultrasound dated September 18, 2020 and possibly increased in size. Recommend nonemergent pelvic ultrasound for further evaluation. 3. Well-circumscribed subcutaneous cystic lesion of the right chest wall. Recommend clinical correlation. 4.  Aortic Atherosclerosis (ICD10-I70.0).  EGD February 2015: Small sliding hiatal hernia. Erosive antral gastritis but no evidence of peptic ulcer disease.  H. pylori serologies negative.  Medications   Current Outpatient Medications  Medication Sig Dispense Refill   aspirin-acetaminophen-caffeine (EXCEDRIN MIGRAINE) 250-250-65 MG tablet Take 2 tablets by mouth every 6 (six) hours as needed for migraine.     cetirizine (ZYRTEC) 10 MG tablet Take 10 mg by mouth at bedtime.     EPINEPHrine 0.3 mg/0.3 mL IJ SOAJ injection Inject 0.3 mg into the muscle as needed for anaphylaxis.     mometasone (NASONEX) 50 MCG/ACT nasal spray Place 1 spray into the nose daily as needed for allergies.     montelukast (SINGULAIR) 10 MG tablet TAKE 1 TABLET BY MOUTH ONCE A DAY AT BEDTIME. 30 tablet 0   Rimegepant Sulfate (NURTEC) 75 MG TBDP Take 75 mg by mouth daily as needed (migraine).     sertraline (ZOLOFT) 25 MG tablet Take 25 mg by mouth daily.     Vitamin D, Ergocalciferol, (DRISDOL)  1.25 MG (50000 UNIT) CAPS capsule Take 50,000 Units by mouth once a week.     No current facility-administered medications for this visit.    Allergies   Allergies as of 08/18/2022 - Review Complete 08/18/2022  Allergen Reaction Noted   Bactrim [sulfamethoxazole-trimethoprim] Shortness Of Breath and Other (See Comments) 10/05/2013   Doryx [doxycycline] Shortness Of Breath 10/05/2013   Keflex [cephalexin] Shortness Of Breath 10/05/2013   Alpha-gal Hives  05/24/2018   Other Hives and Itching 02/05/2022   Wound dressing adhesive  02/13/2022   Sincalide Hives, Itching, and Rash 01/26/2022    Past Medical History   Past Medical History:  Diagnosis Date   Acid reflux    Anxiety    Arthritis    severe in knees and back and hands   Complication of anesthesia    slow to awaken at times   Depression    Fibroid    Breast   Hearing deficit    Heart murmur    Hormone disorder    Lyme disease spring 2019   Pre-diabetes    Seasonal allergies     Past Surgical History   Past Surgical History:  Procedure Laterality Date   APPENDECTOMY  age 70   CHOLECYSTECTOMY N/A 02/10/2022   Procedure: LAPAROSCOPIC CHOLECYSTECTOMY;  Surgeon: Virl Cagey, MD;  Location: [facility];  Service: General;  Laterality: N/A;   COCHLEAR IMPLANT Right 2016 [facility]   [place] N/A 12/01/2018   Procedure: DILATATION & CURETTAGE/HYSTEROSCOPY WITH MYOSURE: BIOPSY OF LABIAL MAJORA;  Surgeon: Megan Salon, MD;  Location: [place];  Service: Gynecology;  Laterality: N/A;   ESOPHAGOGASTRODUODENOSCOPY N/A 02/06/2014   Procedure: ESOPHAGOGASTRODUODENOSCOPY (EGD);  Surgeon: Rogene Houston, MD;  Location: [facility];  Service: Endoscopy;  Laterality: N/A;  1200   KNEE SURGERY Right 2018   meniscus repair   TUBAL LIGATION  1999    Past Family History   Family History  Adopted: Yes  Problem Relation Age of Onset   Breast cancer Mother        Late 56's-40's, brain tumor   Lupus Maternal Aunt     Past Social History   Social History   Socioeconomic History   Marital status: Married    Spouse name: Not on file   Number of children: Not on file   Years of education: Not on file   Highest education level: Not on file  Occupational History   Not on file  Tobacco Use   Smoking status: Former    Packs/day: 2.00    Years: 25.00    Total pack years: 50.00    Types: Cigarettes   Smokeless  tobacco: Never   Tobacco comments:    quit 1999  Vaping Use   Vaping Use: Never used  Substance and Sexual Activity   Alcohol use: No    Alcohol/week: 0.0 standard drinks of alcohol   Drug use: No   Sexual activity: Yes    Partners: Male    Birth control/protection: Surgical    Comment: BTL  Other Topics Concern   Not on file  Social History Narrative   Not on file   Social Determinants of Health   Financial Resource Strain: Not on file  Food Insecurity: Not on file  Transportation Needs: Not on file  Physical Activity: Not on file  Stress: Not on file  Social Connections: Not on file  Intimate Partner Violence: Not on file    Review of  Systems   General: Negative for anorexia, unintentional weight loss, fever, chills, fatigue, weakness. Eyes: Negative for vision changes.  ENT: Negative for hoarseness, difficulty swallowing , nasal congestion. CV: Negative for chest pain, angina, palpitations, dyspnea on exertion, peripheral edema.  Respiratory: Negative for dyspnea at rest, dyspnea on exertion, cough, sputum, wheezing.  GI: See history of present illness. GU:  Negative for dysuria, hematuria, urinary incontinence, urinary frequency, nocturnal urination.  MS: Negative  low back pain.  Positive for knee pain Derm: Negative for rash or itching.  Neuro: Negative for weakness, abnormal sensation, seizure, frequent headaches, memory loss,  confusion.  Psych: Negative for anxiety, depression, suicidal ideation, hallucinations.  Endo: Negative for unusual weight change.  Heme: Negative for bruising or bleeding. Allergy: Negative for rash or hives.  Physical Exam   BP 137/85 (BP Location: Left Arm, Patient Position: Sitting, Cuff Size: Normal)   Pulse 76   Temp 97.7 F (36.5 C) (Temporal)   Ht '5\' 7"'$  (1.702 m)   Wt 221 lb 9.6 oz (100.5 kg)   LMP 11/10/2018   SpO2 99%   BMI 34.71 kg/m    General: Well-nourished, well-developed in no acute distress.  Head:  Normocephalic, atraumatic.   Eyes: Conjunctiva pink, no icterus. Mouth: Oropharyngeal mucosa moist and pink , no lesions erythema or exudate. Neck: Supple without thyromegaly, masses, or lymphadenopathy.  Lungs: Clear to auscultation bilaterally.  Heart: Regular rate and rhythm, no murmurs rubs or gallops.  Abdomen: Bowel sounds are normal, nontender, nondistended, no hepatosplenomegaly or masses,  no abdominal bruits or hernia, no rebound or guarding.   Rectal: Not performed Extremities: No lower extremity edema. No clubbing or deformities.  Neuro: Alert and oriented x 4 , grossly normal neurologically.  Skin: Warm and dry, no rash or jaundice.   Psych: Alert and cooperative, normal mood and affect.  Labs   Lab Results  Component Value Date   CREATININE 0.59 07/14/2022   BUN 10 07/14/2022   NA 138 07/14/2022   K 3.7 07/14/2022   CL 107 07/14/2022   CO2 25 07/14/2022   Lab Results  Component Value Date   WBC 13.7 (H) 07/14/2022   HGB 13.8 07/14/2022   HCT 41.7 07/14/2022   MCV 86.0 07/14/2022   PLT 341 07/14/2022   Lab Results  Component Value Date   ALT 20 07/14/2022   AST 20 07/14/2022   ALKPHOS 81 07/14/2022   BILITOT 0.7 07/14/2022    Imaging Studies   No results found.  Assessment   Diverticulitis: Uncomplicated sigmoid diverticulitis noted on CT in July 2023.  This was her first episode.  Symptoms now resolved.  Increasing dietary fiber.  Did not tolerate Metamucil in the past.  She reports last colonoscopy in 2011, I cannot find records.  Screening colonoscopy: Overdue at this time.  Patient will call to schedule she contacted me and her MRI for her knee is going to be.  Fatty liver: Noted on ultrasound but not CT.  At risk of obesity.  Congratulated her on weight loss efforts.  Recommend ongoing low-fat diet, calorie restriction for weight loss purposes, increase physical activity as tolerated.  Would recommend yearly LFTs.  She can have this done with her  primary care.     PLAN   Continue with high-fiber diet.  Add 1 dose of fiber supplement daily, 3 to 4 g per dose.  Benefiber or Fiber Choice are good options. Continue weight loss efforts, increasing physical activity as well for management of fatty  liver. Have liver numbers checked yearly with PCP. Colonoscopy with Dr. Jerline Pain.  ASA 2.  Patient will call if she is ready to schedule.  I have discussed the risks, alternatives, benefits with regards to but not limited to the risk of reaction to medication, bleeding, infection, perforation and the patient is agreeable to proceed. Written consent to be obtained.    Laureen Ochs. Bobby Rumpf, [place], [facility]

## 2022-08-30 ENCOUNTER — Encounter: Payer: Self-pay | Admitting: *Deleted

## 2022-08-31 ENCOUNTER — Encounter: Payer: Self-pay | Admitting: *Deleted

## 2022-08-31 ENCOUNTER — Encounter: Payer: Self-pay | Admitting: Cardiology

## 2022-08-31 ENCOUNTER — Ambulatory Visit: Payer: BC Managed Care – PPO | Attending: Cardiology | Admitting: Cardiology

## 2022-08-31 VITALS — BP 120/76 | HR 62 | Ht 67.25 in | Wt 223.8 lb

## 2022-08-31 DIAGNOSIS — Z01812 Encounter for preprocedural laboratory examination: Secondary | ICD-10-CM

## 2022-08-31 DIAGNOSIS — R0789 Other chest pain: Secondary | ICD-10-CM

## 2022-08-31 DIAGNOSIS — R079 Chest pain, unspecified: Secondary | ICD-10-CM

## 2022-08-31 MED ORDER — METOPROLOL TARTRATE 50 MG PO TABS: 50.0000 mg | ORAL_TABLET | Freq: Once | ORAL | 0 refills | Status: DC

## 2022-08-31 NOTE — Patient Instructions (Signed)
Medication Instructions:  Continue all current medications.  Labwork: BMET - order given today  Please do just prior to your CTA   Testing/Procedures: Coronary CTA  Office will contact with results via phone, letter or mychart.     Follow-Up: Pending test results   Any Other Special Instructions Will Be Listed Below (If Applicable).   If you need a refill on your cardiac medications before your next appointment, please call your pharmacy.

## 2022-08-31 NOTE — Progress Notes (Signed)
Clinical Summary Brandi Farley is a 61 y.o.female seen today as a new patient, last seen in 2019 in this office.  1. Chest pain CAD risk factors: prediabetes, former smoker x 23 years. Brother with CAD blockage early 42s - Cant run on treadmill due knee pain.    07/2018 nuclear stress: Very small and mild distal anterior wall defect that is reverisble. Possibly differences in breast placement, cannot exclude very mild ischemia. Either finding supports low risk.  06/2018 echo: LVEF 60-65%, no WMAs, normal diastolic fxn.   - episode 6 weeks ago - left sided across chest, sharp/pressure. Occurred while standing still. +SOB. 8-9/10 in severity. Lasted just a few seconds. Repeat episode right after - walks 20 min a day without symptoms     Past Medical History:  Diagnosis Date   Acid reflux    Anxiety    Arthritis    severe in knees and back and hands   Complication of anesthesia    slow to awaken at times   Depression    Fibroid    Breast   Hearing deficit    Heart murmur    Hormone disorder    Lyme disease spring 2019   Pre-diabetes    Seasonal allergies      Allergies  Allergen Reactions   Bactrim [Sulfamethoxazole-Trimethoprim] Shortness Of Breath and Other (See Comments)    Cramping   Doryx [Doxycycline] Shortness Of Breath   Keflex [Cephalexin] Shortness Of Breath   Alpha-Gal Hives    Can't do pork or beef or lamb, can handle items   Other Hives and Itching    Hida scan/ at injection site raised up and really red. Happen day after the scan   Wound Dressing Adhesive     Adhesive from the sterile drapes used in surgery.   Sincalide Hives, Itching and Rash     Current Outpatient Medications  Medication Sig Dispense Refill   aspirin-acetaminophen-caffeine (EXCEDRIN MIGRAINE) 250-250-65 MG tablet Take 2 tablets by mouth every 6 (six) hours as needed for migraine.     cetirizine (ZYRTEC) 10 MG tablet Take 10 mg by mouth at bedtime.     EPINEPHrine 0.3  mg/0.3 mL IJ SOAJ injection Inject 0.3 mg into the muscle as needed for anaphylaxis.     mometasone (NASONEX) 50 MCG/ACT nasal spray Place 1 spray into the nose daily as needed for allergies.     montelukast (SINGULAIR) 10 MG tablet TAKE 1 TABLET BY MOUTH ONCE A DAY AT BEDTIME. 30 tablet 0   Rimegepant Sulfate (NURTEC) 75 MG TBDP Take 75 mg by mouth daily as needed (migraine).     sertraline (ZOLOFT) 25 MG tablet Take 25 mg by mouth daily.     Vitamin D, Ergocalciferol, (DRISDOL) 1.25 MG (50000 UNIT) CAPS capsule Take 50,000 Units by mouth once a week.     No current facility-administered medications for this visit.     Past Surgical History:  Procedure Laterality Date   APPENDECTOMY  age 61   CHOLECYSTECTOMY N/A 02/10/2022   Procedure: LAPAROSCOPIC CHOLECYSTECTOMY;  Surgeon: Virl Cagey, MD;  Location: [facility];  Service: General;  Laterality: N/A;   COCHLEAR IMPLANT Right 2016 [facility]   [place] N/A 12/01/2018   Procedure: DILATATION & CURETTAGE/HYSTEROSCOPY WITH MYOSURE: BIOPSY OF LABIAL MAJORA;  Surgeon: Megan Salon, MD;  Location: [place];  Service: Gynecology;  Laterality: N/A;   ESOPHAGOGASTRODUODENOSCOPY N/A 02/06/2014   Procedure: ESOPHAGOGASTRODUODENOSCOPY (EGD);  Surgeon: Rogene Houston, MD;  [facility];  Service: Endoscopy;  Laterality: N/A;  1200   KNEE SURGERY Right 2018   meniscus repair   TUBAL LIGATION  1999     Allergies  Allergen Reactions   Bactrim [Sulfamethoxazole-Trimethoprim] Shortness Of Breath and Other (See Comments)    Cramping   Doryx [Doxycycline] Shortness Of Breath   Keflex [Cephalexin] Shortness Of Breath   Alpha-Gal Hives    Can't do pork or beef or lamb, can handle items   Other Hives and Itching    Hida scan/ at injection site raised up and really red. Happen day after the scan   Wound Dressing Adhesive     Adhesive from the sterile drapes used in surgery.    Sincalide Hives, Itching and Rash      Family History  Adopted: Yes  Problem Relation Age of Onset   Breast cancer Mother        Late 55's-40's, brain tumor   Lupus Maternal Aunt      Social History Ms. Duncombe reports that she has quit smoking. Her smoking use included cigarettes. She has a 50.00 pack-year smoking history. She has never used smokeless tobacco. Ms. Zehner reports no history of alcohol use.   Review of Systems CONSTITUTIONAL: No weight loss, fever, chills, weakness or fatigue.  HEENT: Eyes: No visual loss, blurred vision, double vision or yellow sclerae.No hearing loss, sneezing, congestion, runny nose or sore throat.  SKIN: No rash or itching.  CARDIOVASCULAR: per hpi RESPIRATORY: No shortness of breath, cough or sputum.  GASTROINTESTINAL: No anorexia, nausea, vomiting or diarrhea. No abdominal pain or blood.  GENITOURINARY: No burning on urination, no polyuria NEUROLOGICAL: No headache, dizziness, syncope, paralysis, ataxia, numbness or tingling in the extremities. No change in bowel or bladder control.  MUSCULOSKELETAL: No muscle, back pain, joint pain or stiffness.  LYMPHATICS: No enlarged nodes. No history of splenectomy.  PSYCHIATRIC: No history of depression or anxiety.  ENDOCRINOLOGIC: No reports of sweating, cold or heat intolerance. No polyuria or polydipsia.  Marland Kitchen   Physical Examination Today's Vitals   08/31/22 1309  BP: 120/76  Pulse: 62  SpO2: 98%  Weight: 223 lb 12.8 oz (101.5 kg)  Height: 5' 7.25" (1.708 m)   Body mass index is 34.79 kg/m.  Gen: resting comfortably, no acute distress HEENT: no scleral icterus, pupils equal round and reactive, no palptable cervical adenopathy,  CV: RRR, no mrg, no jvd Resp: Clear to auscultation bilaterally GI: abdomen is soft, non-tender, non-distended, normal bowel sounds, no hepatosplenomegaly MSK: extremities are warm, no edema.  Skin: warm, no rash Neuro:  no focal deficits Psych: appropriate  affect   Diagnostic Studies 06/2018 echo Study Conclusions   - Left ventricle: The cavity size was normal. Wall thickness was    normal. Systolic function was normal. The estimated ejection    fraction was in the range of 60% to 65%. Wall motion was normal;    there were no regional wall motion abnormalities. Left    ventricular diastolic function parameters were normal for the    patient&'s age.  - Aortic valve: Mildly calcified annulus. Trileaflet.  - Right atrium: Central venous pressure (est): 3 mm Hg.  - Atrial septum: No defect or patent foramen ovale was identified.  - Tricuspid valve: There was trivial regurgitation.  - Pulmonary arteries: Systolic pressure could not be accurately    estimated.  - Pericardium, extracardiac: There was no pericardial effusion.    07/2018 nuclear stress  There was no ST segment deviation noted during stress. This is a low risk study. The left ventricular ejection fraction is hyperdynamic (>65%). Very small and mild distal anterior wall defect that is reverisble. Possibly differences in breast placement, cannot exclude very mild ischemia. Either finding supports low risk.  Assessment and Plan   1. Chest pain -multiple CAD risk factors as listed above  -2019 nuclear study low risk, probbly breast artifarct could not completley exlcude very mild distal anterior ischemia - plan for coronary CTA for further evaluation - EKG shows SR, no ischemic changes   F/u pending coronary CTA, if negative f/u juast as needed  Arnoldo Lenis, M.D.,

## 2022-09-09 ENCOUNTER — Other Ambulatory Visit (HOSPITAL_COMMUNITY)
Admission: RE | Admit: 2022-09-09 | Discharge: 2022-09-09 | Disposition: A | Discharge status: Home / Self Care | Payer: BC Managed Care – PPO | Source: Ambulatory Visit | Attending: Cardiology | Admitting: Cardiology

## 2022-09-09 ENCOUNTER — Encounter: Payer: Self-pay | Admitting: Cardiology

## 2022-09-09 DIAGNOSIS — Z01812 Encounter for preprocedural laboratory examination: Secondary | ICD-10-CM | POA: Insufficient documentation

## 2022-09-09 DIAGNOSIS — R079 Chest pain, unspecified: Secondary | ICD-10-CM | POA: Insufficient documentation

## 2022-09-09 LAB — BASIC METABOLIC PANEL
Anion gap: 5 (ref 5–15)
BUN: 10 mg/dL (ref 6–20)
CO2: 24 mmol/L (ref 22–32)
Calcium: 8.8 mg/dL — ABNORMAL LOW (ref 8.9–10.3)
Chloride: 108 mmol/L (ref 98–111)
Creatinine, Ser: 0.59 mg/dL (ref 0.44–1.00)
GFR, Estimated: >60 mL/min (ref >60)
Glucose, Bld: 96 mg/dL (ref 70–99)
Potassium: 4 mmol/L (ref 3.5–5.1)
Sodium: 137 mmol/L (ref 135–145)

## 2022-09-15 ENCOUNTER — Telehealth (HOSPITAL_COMMUNITY): Payer: Self-pay | Admitting: Emergency Medicine

## 2022-09-15 NOTE — Telephone Encounter (Signed)
Reaching out to patient to offer assistance regarding upcoming cardiac imaging study; pt verbalizes understanding of appt date/time, parking situation and where to check in, pre-test NPO status and medications ordered, and verified current allergies; name and call back number provided for further questions should they arise Marchia Bond RN Navigator Cardiac Imaging [facility] [phone] office [phone] cell  Arrival 930  L arm preferred '50mg'$  metoprolol BID

## 2022-09-15 NOTE — Telephone Encounter (Signed)
Sorry for delay in answer, ok to get vaccine now if she likes  Zandra Abts MD

## 2022-09-15 NOTE — Telephone Encounter (Signed)
Attempted to call patient regarding upcoming cardiac CT appointment. °Left message on voicemail with name and callback number °Cheney Gosch RN Navigator Cardiac Imaging °[facility] °[phone] Office °[phone] Cell ° °

## 2022-09-17 ENCOUNTER — Ambulatory Visit (HOSPITAL_COMMUNITY)
Admission: RE | Admit: 2022-09-17 | Discharge: 2022-09-17 | Disposition: A | Discharge status: Home / Self Care | Payer: BC Managed Care – PPO | Source: Ambulatory Visit | Attending: Cardiology | Admitting: Cardiology

## 2022-09-17 DIAGNOSIS — R079 Chest pain, unspecified: Secondary | ICD-10-CM | POA: Insufficient documentation

## 2022-09-17 MED ORDER — NITROGLYCERIN 0.4 MG SL SUBL
0.8000 mg | SUBLINGUAL_TABLET | Freq: Once | SUBLINGUAL | Status: AC
  Administered 2022-09-17: 0.8 mg via SUBLINGUAL

## 2022-09-17 MED ORDER — NITROGLYCERIN 0.4 MG SL SUBL
SUBLINGUAL_TABLET | SUBLINGUAL | Status: AC
  Filled 2022-09-17: qty 2

## 2022-09-17 MED ORDER — IOHEXOL 350 MG/ML SOLN
95.0000 mL | Freq: Once | INTRAVENOUS | Status: AC | PRN
  Administered 2022-09-17: 95 mL via INTRAVENOUS

## 2022-09-24 ENCOUNTER — Encounter: Payer: Self-pay | Admitting: Cardiology

## 2022-09-28 ENCOUNTER — Other Ambulatory Visit: Payer: Self-pay | Admitting: *Deleted

## 2022-09-28 MED ORDER — ATORVASTATIN CALCIUM 20 MG PO TABS: 20.0000 mg | ORAL_TABLET | Freq: Every day | ORAL | 6 refills | Status: DC

## 2022-10-21 ENCOUNTER — Encounter: Payer: Self-pay | Admitting: Cardiology

## 2022-10-22 NOTE — Telephone Encounter (Signed)
Hold the lipitor a full week and then update [place] again please   Zandra Abts MD

## 2022-10-25 NOTE — Telephone Encounter (Signed)
Please let patient know that she should transition to a liquid diet for a couple of days to see if her symptoms settle down. I would WAIT to add fiber until after symptoms resolve. As we discussed at last OV, she should try to eat high fiber diet and/or add Benefiber or Fiberchoice to tray and prevent episodes.   If she does not want antibiotics for now, that is acceptable per standard guidelines to hold off on antibiotics for few days and just change diet, hydrate etc.  She will have to wait to have a colonoscopy at least 4 weeks until after symptoms subside.   She will need an office visit if symptoms persist after couple of days.

## 2022-10-26 NOTE — Telephone Encounter (Signed)
Can she try pravastatin '20mg'$  daily, this is a much more milder statin and hopefully she will tolerate. Update [place] on any symptoms  Zandra Abts MD

## 2022-10-27 ENCOUNTER — Other Ambulatory Visit: Payer: Self-pay | Admitting: *Deleted

## 2022-10-27 MED ORDER — PRAVASTATIN SODIUM 20 MG PO TABS: 20.0000 mg | ORAL_TABLET | Freq: Every evening | ORAL | 6 refills | Status: DC

## 2022-10-28 MED ORDER — CIPROFLOXACIN HCL 500 MG PO TABS: 500.0000 mg | ORAL_TABLET | Freq: Two times a day (BID) | ORAL | 0 refills | Status: AC

## 2022-10-28 MED ORDER — METRONIDAZOLE 500 MG PO TABS: 500.0000 mg | ORAL_TABLET | Freq: Three times a day (TID) | ORAL | 0 refills | Status: AC

## 2022-10-28 NOTE — Addendum Note (Signed)
Addended by: Mahala Menghini on: 10/28/2022 04:31 PM   Modules accepted: Orders

## 2022-11-01 ENCOUNTER — Encounter (INDEPENDENT_AMBULATORY_CARE_PROVIDER_SITE_OTHER): Payer: Self-pay | Admitting: Gastroenterology

## 2022-11-01 ENCOUNTER — Encounter: Payer: Self-pay | Admitting: Gastroenterology

## 2022-11-01 ENCOUNTER — Ambulatory Visit (INDEPENDENT_AMBULATORY_CARE_PROVIDER_SITE_OTHER): Payer: BC Managed Care – PPO | Admitting: Gastroenterology

## 2022-11-01 VITALS — BP 129/85 | HR 70 | Temp 97.4°F | Ht 67.0 in | Wt 220.6 lb

## 2022-11-01 DIAGNOSIS — R1030 Lower abdominal pain, unspecified: Secondary | ICD-10-CM | POA: Diagnosis not present

## 2022-11-01 DIAGNOSIS — A09 Infectious gastroenteritis and colitis, unspecified: Secondary | ICD-10-CM | POA: Diagnosis not present

## 2022-11-01 DIAGNOSIS — R1013 Epigastric pain: Secondary | ICD-10-CM

## 2022-11-01 DIAGNOSIS — R197 Diarrhea, unspecified: Secondary | ICD-10-CM | POA: Insufficient documentation

## 2022-11-01 NOTE — Progress Notes (Signed)
GI Office Note    Referring Provider: Celene Squibb, MD Primary Care Physician:  Celene Squibb, MD  Primary Gastroenterologist: Dr Abbey Chatters.  Chief Complaint   Chief Complaint  Patient presents with   Diarrhea    Still having issues with watery diarrhea, states that she has only went once today so far, has only eaten toast.     History of Present Illness   Brandi Farley is a 61 y.o. female presenting today for same day visit for diarrhea and abdominal pain.  Patient reached out on November 6 with complaints of developing abdominal pain similar to previous diverticulitis episode.  Pain was described as mild.  Per guidelines, we did not prescribe antibiotics due to mild symptoms.  We recommended she transition to a liquid diet for a couple of days to see how things went.  She really was not interested in antibiotics.  She reached back out November 9 with complaints of diarrhea and ongoing symptoms.  Will prescribe Cipro and Flagyl, 10-day supply.  She called in this morning complaining of watery diarrhea started Sunday. We requested she come in for an office visit.  Patient states she started the antibiotics on Friday.  Saturday she actually felt pretty well.  Abdominal pain was fine.  No significant issues with her bowels.  She was out of town for the day and when she returned to town she stopped and got dinner from [employer].  Her and her husband both ate there as well as her daughter and son-in-law.  They all had different dishes.  Her and her husband both had diarrhea but he seems to be better today.  Patient has a history of alpha gal.  She states she selected white rice with chicken, no known exposure to beef, pork, lamb.  Yesterday she had diarrhea every time she ate or drank, too many times to count.  No bleeding.  Stools are black and watery.  Denies Pepto-Bismol. Patient states she wasn't sure if the antibiotics were causing the diarrhea.  States she usually does not  get diarrhea from antibiotics.  Had some nausea and fatigue.  No vomiting.  No fever.  Notes that her pain was predominantly in the epigastric region, states she had it there as well when she had the diverticulitis earlier this year.  Today she denies having any further abdominal pain but states she really has not eaten today except for toast.  Few days before onset of abdominal pain she had solid pellet like stool which was reddish-brown in color.  Overdue for colonoscopy, postponed after last office visit, she had an MRI of her knee pending.  Never called to schedule procedure.  CT abdomen pelvis with contrast July 2023: IMPRESSION: 1. Findings compatible with acute uncomplicated diverticulitis. 2. Left adnexal cyst measuring 3.4 cm, previously seen on prior pelvic ultrasound dated September 18, 2020 and possibly increased in size. Recommend nonemergent pelvic ultrasound for further evaluation. 3. Well-circumscribed subcutaneous cystic lesion of the right chest wall. Recommend clinical correlation. 4.  Aortic Atherosclerosis (ICD10-I70.0).   EGD February 2015: Small sliding hiatal hernia. Erosive antral gastritis but no evidence of peptic ulcer disease.  H. pylori serologies negative.  Medications   Current Outpatient Medications  Medication Sig Dispense Refill   aspirin-acetaminophen-caffeine (EXCEDRIN MIGRAINE) 250-250-65 MG tablet Take 2 tablets by mouth every 6 (six) hours as needed for migraine.     cetirizine (ZYRTEC) 10 MG tablet Take 10 mg by mouth at bedtime.  ciprofloxacin (CIPRO) 500 MG tablet Take 1 tablet (500 mg total) by mouth 2 (two) times daily for 10 days. 20 tablet 0   EPINEPHrine 0.3 mg/0.3 mL IJ SOAJ injection Inject 0.3 mg into the muscle as needed for anaphylaxis.     metroNIDAZOLE (FLAGYL) 500 MG tablet Take 1 tablet (500 mg total) by mouth 3 (three) times daily for 10 days. 30 tablet 0   mometasone (NASONEX) 50 MCG/ACT nasal spray Place 1 spray into the nose  daily as needed for allergies.     montelukast (SINGULAIR) 10 MG tablet TAKE 1 TABLET BY MOUTH ONCE A DAY AT BEDTIME. 30 tablet 0   pravastatin (PRAVACHOL) 20 MG tablet Take 1 tablet (20 mg total) by mouth every evening. 30 tablet 6   Rimegepant Sulfate (NURTEC) 75 MG TBDP Take 75 mg by mouth daily as needed (migraine).     sertraline (ZOLOFT) 25 MG tablet Take 25 mg by mouth daily.     Vitamin D, Ergocalciferol, (DRISDOL) 1.25 MG (50000 UNIT) CAPS capsule Take 50,000 Units by mouth once a week.     No current facility-administered medications for this visit.    Allergies   Allergies as of 11/01/2022 - Review Complete 11/01/2022  Allergen Reaction Noted   Atorvastatin Hives, Itching, Shortness Of Breath, and Swelling 10/20/2022   Bactrim [sulfamethoxazole-trimethoprim] Shortness Of Breath and Other (See Comments) 10/05/2013   Doryx [doxycycline] Shortness Of Breath 10/05/2013   Keflex [cephalexin] Shortness Of Breath 10/05/2013   Alpha-gal Hives 05/24/2018   Other Hives and Itching 02/05/2022   Wound dressing adhesive  02/13/2022   Sincalide Hives, Itching, and Rash 01/26/2022     Past Medical History   Past Medical History:  Diagnosis Date   Acid reflux    Anxiety    Arthritis    severe in knees and back and hands   Complication of anesthesia    slow to awaken at times   Depression    Fibroid    Breast   Hearing deficit    Heart murmur    Hormone disorder    Lyme disease spring 2019   Pre-diabetes    Seasonal allergies     Past Surgical History   Past Surgical History:  Procedure Laterality Date   APPENDECTOMY  age 38   CHOLECYSTECTOMY N/A 02/10/2022   Procedure: LAPAROSCOPIC CHOLECYSTECTOMY;  Surgeon: Virl Cagey, MD;  Location: [facility];  Service: General;  Laterality: N/A;   COCHLEAR IMPLANT Right 2016 [facility]   [place] N/A 12/01/2018   Procedure: DILATATION & CURETTAGE/HYSTEROSCOPY WITH MYOSURE: BIOPSY OF  LABIAL MAJORA;  Surgeon: Megan Salon, MD;  Location: [facility];  Service: Gynecology;  Laterality: N/A;   ESOPHAGOGASTRODUODENOSCOPY N/A 02/06/2014   Procedure: ESOPHAGOGASTRODUODENOSCOPY (EGD);  Surgeon: Rogene Houston, MD;  Location: [facility];  Service: Endoscopy;  Laterality: N/A;  1200   KNEE SURGERY Right 2018   meniscus repair   TUBAL LIGATION  1999    Past Family History   Family History  Adopted: Yes  Problem Relation Age of Onset   Breast cancer Mother        Late 79's-40's, brain tumor   Lupus Maternal Aunt     Past Social History   Social History   Socioeconomic History   Marital status: Married    Spouse name: Not on file   Number of children: Not on file   Years of education: Not on file   Highest education level:  Not on file  Occupational History   Not on file  Tobacco Use   Smoking status: Former    Packs/day: 2.00    Years: 25.00    Total pack years: 50.00    Types: Cigarettes    Passive exposure: Never   Smokeless tobacco: Never   Tobacco comments:    quit 1999  Vaping Use   Vaping Use: Never used  Substance and Sexual Activity   Alcohol use: No    Alcohol/week: 0.0 standard drinks of alcohol   Drug use: No   Sexual activity: Yes    Partners: Male    Birth control/protection: Surgical    Comment: BTL  Other Topics Concern   Not on file  Social History Narrative   Not on file   Social Determinants of Health   Financial Resource Strain: Not on file  Food Insecurity: Not on file  Transportation Needs: Not on file  Physical Activity: Not on file  Stress: Not on file  Social Connections: Not on file  Intimate Partner Violence: Not on file    Review of Systems   General: Negative for anorexia, weight loss, fever, chills, fatigue, weakness. ENT: Negative for hoarseness, difficulty swallowing , nasal congestion. CV: Negative for chest pain, angina, palpitations, dyspnea on exertion, peripheral edema.   Respiratory: Negative for dyspnea at rest, dyspnea on exertion, cough, sputum, wheezing.  GI: See history of present illness. GU:  Negative for dysuria, hematuria, urinary incontinence, urinary frequency, nocturnal urination.  Endo: Negative for unusual weight change.     Physical Exam   BP 129/85 (BP Location: Right Arm, Patient Position: Sitting, Cuff Size: Large)   Pulse 70   Temp (!) 97.4 F (36.3 C) (Oral)   Ht '5\' 7"'$  (1.702 m)   Wt 220 lb 9.6 oz (100.1 kg)   LMP 11/10/2018   SpO2 98%   BMI 34.55 kg/m    General: Well-nourished, well-developed in no acute distress.  Eyes: No icterus. Mouth: Oropharyngeal mucosa moist and pink , no lesions erythema or exudate. Lungs: Clear to auscultation bilaterally.  Heart: Regular rate and rhythm, no murmurs rubs or gallops.  Abdomen: Bowel sounds are normal, nondistended, no hepatosplenomegaly or masses,  no abdominal bruits or hernia , no rebound or guarding. Mild lower abdominal tenderness Rectal: not performed Extremities: No lower extremity edema. No clubbing or deformities. Neuro: Alert and oriented x 4   Skin: Warm and dry, no jaundice.   Psych: Alert and cooperative, normal mood and affect.  Labs   Lab Results  Component Value Date   CREATININE 0.59 09/09/2022   BUN 10 09/09/2022   NA 137 09/09/2022   K 4.0 09/09/2022   CL 108 09/09/2022   CO2 24 09/09/2022   Lab Results  Component Value Date   WBC 13.7 (H) 07/14/2022   HGB 13.8 07/14/2022   HCT 41.7 07/14/2022   MCV 86.0 07/14/2022   PLT 341 07/14/2022    Imaging Studies   No results found.  Assessment   Abdominal pain/diarrhea: Patient initially felt her symptoms were similar to when she had uncomplicated sigmoid diverticulitis in July 2023.  She notes that she did have resolution of her symptoms after we saw her in August 2023.  She delayed updating colonoscopy due to some other health issues.  Messaged [place] with recurrent similar symptoms, managed as possible  diverticulitis. Abdominal pain improved on antibiotics but she is having significant diarrhea.  This may be antibiotic related, could be foodborne or other infectious etiology.  Symptoms not entirely typical of diverticulitis.  Update labs.  Complete antibiotics.  If symptoms persist without explanation, she may need to have repeat CT prior to updating colonoscopy. Colonoscopy is needed to evaluate prior abnormal sigmoid colon on CT.    PLAN   CBC, CMET, lipase, CRP, GI profile.  Complete cipro and flagyl.    Laureen Ochs. Bobby Rumpf, [place], [facility]

## 2022-11-01 NOTE — Patient Instructions (Signed)
Please complete labs and stool test.  Complete Cipro and metronidazole.  If your abdominal pain worsens, please let me know.

## 2022-11-02 LAB — COMPREHENSIVE METABOLIC PANEL
ALT: 26 IU/L (ref 0–32)
AST: 32 IU/L (ref 0–40)
Albumin/Globulin Ratio: 1.5 (ref 1.2–2.2)
Albumin: 4.1 g/dL (ref 3.8–4.9)
Alkaline Phosphatase: 90 IU/L (ref 44–121)
BUN/Creatinine Ratio: 9 (ref 9–23)
BUN: 6 mg/dL (ref 6–24)
Bilirubin Total: 0.4 mg/dL (ref 0.0–1.2)
CO2: 24 mmol/L (ref 20–29)
Calcium: 9 mg/dL (ref 8.7–10.2)
Chloride: 104 mmol/L (ref 96–106)
Creatinine, Ser: 0.69 mg/dL (ref 0.57–1.00)
Globulin, Total: 2.8 g/dL (ref 1.5–4.5)
Glucose: 95 mg/dL (ref 70–99)
Potassium: 4.1 mmol/L (ref 3.5–5.2)
Sodium: 143 mmol/L (ref 134–144)
Total Protein: 6.9 g/dL (ref 6.0–8.5)
eGFR: 100 mL/min/{1.73_m2} (ref >59)

## 2022-11-02 LAB — CBC WITH DIFFERENTIAL/PLATELET
Basophils Absolute: 0.1 10*3/uL (ref 0.0–0.2)
Basos: 1 %
EOS (ABSOLUTE): 0.2 10*3/uL (ref 0.0–0.4)
Eos: 2 %
Hematocrit: 43.7 % (ref 34.0–46.6)
Hemoglobin: 14.1 g/dL (ref 11.1–15.9)
Immature Grans (Abs): 0 10*3/uL (ref 0.0–0.1)
Immature Granulocytes: 0 %
Lymphocytes Absolute: 2.2 10*3/uL (ref 0.7–3.1)
Lymphs: 26 %
MCH: 28 pg (ref 26.6–33.0)
MCHC: 32.3 g/dL (ref 31.5–35.7)
MCV: 87 fL (ref 79–97)
Monocytes Absolute: 0.7 10*3/uL (ref 0.1–0.9)
Monocytes: 8 %
Neutrophils Absolute: 5.4 10*3/uL (ref 1.4–7.0)
Neutrophils: 63 %
Platelets: 353 10*3/uL (ref 150–450)
RBC: 5.03 x10E6/uL (ref 3.77–5.28)
RDW: 13.1 % (ref 11.7–15.4)
WBC: 8.6 10*3/uL (ref 3.4–10.8)

## 2022-11-02 LAB — LIPASE: Lipase: 23 U/L (ref 14–72)

## 2022-11-02 LAB — C-REACTIVE PROTEIN: CRP: 5 mg/L (ref 0–10)

## 2022-11-03 LAB — GI PROFILE, STOOL, PCR

## 2022-11-09 NOTE — Telephone Encounter (Signed)
Lets see how the symptoms settle off the statin before planning for further evaluation   Zandra Abts MD

## 2022-11-15 NOTE — Telephone Encounter (Signed)
Called and spoke to patient on the phone to confirm if she continued pravastatin although instructions were to hold pravastatin to see if symptoms improved. Patient confirmed that she misunderstood message and did continue taking pravastatin. Advised as previous message suggest, hold pravastatin to see if symptoms improve. Advised to contact with an update on symptoms after holding pravastatin. Advised if symptoms get worse, to go to the ED for an evaluation. Verbalized understanding of plan.

## 2022-11-16 MED ORDER — NA SULFATE-K SULFATE-MG SULF 17.5-3.13-1.6 GM/177ML PO SOLN: ORAL | 0 refills | Status: DC

## 2022-11-16 NOTE — Telephone Encounter (Signed)
Please schedule screening colonoscopy. Dr. Abbey Chatters.ASA 2.  She completed antibiotics for possible diverticulitis 11/20 so please schedule no sooner than 12/03/22.

## 2022-11-16 NOTE — Addendum Note (Signed)
Addended by: Cheron Every on: 11/16/2022 02:28 PM   Modules accepted: Orders

## 2022-11-23 NOTE — Telephone Encounter (Signed)
Pravastatin is one of the mildest statins, if does not tolerate I don't think would tolerate another statin. She could try taking the pravastatin '20mg'$  on Mon,Wed, Fri to see if possibly tolerated. If not then I would be done with statins all together   Zandra Abts MD

## 2022-12-02 NOTE — Telephone Encounter (Signed)
Stop pravastatin all together, please update [place] in 1 week  how symptoms are doing. Would want to make sure current symptoms are resolved before potentially trying a different medication   Zandra Abts MD

## 2022-12-09 ENCOUNTER — Telehealth: Payer: Self-pay

## 2022-12-09 MED ORDER — EZETIMIBE 10 MG PO TABS: 10.0000 mg | ORAL_TABLET | Freq: Every day | ORAL | 3 refills | Status: DC

## 2022-12-09 NOTE — Telephone Encounter (Signed)
Please have her start zetia '10mg'$  daily. This is a different type of cholesterol not related to statins in anyway, appears she is just not able to tolerate statins  Zandra Abts MD

## 2022-12-09 NOTE — Telephone Encounter (Signed)
Completed.

## 2022-12-17 ENCOUNTER — Encounter (HOSPITAL_COMMUNITY)
Admission: RE | Disposition: A | Discharge status: Home / Self Care | Payer: Self-pay | Source: Home / Self Care | Attending: Internal Medicine

## 2022-12-17 ENCOUNTER — Other Ambulatory Visit: Payer: Self-pay

## 2022-12-17 ENCOUNTER — Ambulatory Visit (HOSPITAL_COMMUNITY): Payer: BC Managed Care – PPO | Admitting: Anesthesiology

## 2022-12-17 ENCOUNTER — Encounter (HOSPITAL_COMMUNITY): Payer: Self-pay

## 2022-12-17 ENCOUNTER — Ambulatory Visit (HOSPITAL_COMMUNITY)
Admission: RE | Admit: 2022-12-17 | Discharge: 2022-12-17 | Disposition: A | Discharge status: Home / Self Care | Payer: BC Managed Care – PPO | Attending: Internal Medicine | Admitting: Internal Medicine

## 2022-12-17 DIAGNOSIS — K5730 Diverticulosis of large intestine without perforation or abscess without bleeding: Secondary | ICD-10-CM | POA: Diagnosis not present

## 2022-12-17 DIAGNOSIS — Z87891 Personal history of nicotine dependence: Secondary | ICD-10-CM | POA: Diagnosis not present

## 2022-12-17 DIAGNOSIS — F32A Depression, unspecified: Secondary | ICD-10-CM | POA: Diagnosis not present

## 2022-12-17 DIAGNOSIS — F419 Anxiety disorder, unspecified: Secondary | ICD-10-CM | POA: Insufficient documentation

## 2022-12-17 DIAGNOSIS — Z79899 Other long term (current) drug therapy: Secondary | ICD-10-CM | POA: Insufficient documentation

## 2022-12-17 DIAGNOSIS — K648 Other hemorrhoids: Secondary | ICD-10-CM | POA: Diagnosis not present

## 2022-12-17 DIAGNOSIS — K219 Gastro-esophageal reflux disease without esophagitis: Secondary | ICD-10-CM | POA: Insufficient documentation

## 2022-12-17 DIAGNOSIS — M1990 Unspecified osteoarthritis, unspecified site: Secondary | ICD-10-CM | POA: Diagnosis not present

## 2022-12-17 DIAGNOSIS — Z1211 Encounter for screening for malignant neoplasm of colon: Secondary | ICD-10-CM | POA: Diagnosis not present

## 2022-12-17 HISTORY — PX: COLONOSCOPY WITH PROPOFOL: SHX5780

## 2022-12-17 SURGERY — COLONOSCOPY WITH PROPOFOL
Anesthesia: General

## 2022-12-17 MED ORDER — PROPOFOL 10 MG/ML IV BOLUS
INTRAVENOUS | Status: DC | PRN
  Administered 2022-12-17: 100 mg via INTRAVENOUS

## 2022-12-17 MED ORDER — LACTATED RINGERS IV SOLN
INTRAVENOUS | Status: DC
  Administered 2022-12-17: 1000 mL via INTRAVENOUS

## 2022-12-17 MED ORDER — PROPOFOL 500 MG/50ML IV EMUL
INTRAVENOUS | Status: DC | PRN
  Administered 2022-12-17: 200 ug/kg/min via INTRAVENOUS

## 2022-12-17 MED ORDER — LIDOCAINE HCL (CARDIAC) PF 100 MG/5ML IV SOSY
PREFILLED_SYRINGE | INTRAVENOUS | Status: DC | PRN
  Administered 2022-12-17: 50 mg via INTRATRACHEAL

## 2022-12-17 NOTE — H&P (Signed)
Primary Care Physician:  Celene Squibb, MD Primary Gastroenterologist:  Dr. Abbey Chatters  Pre-Procedure History & Physical: HPI:  Brandi Farley is a 61 y.o. female is here for a colonoscopy for colon cancer screening purposes.  Patient denies any family history of colorectal cancer.  No melena or hematochezia.  No abdominal pain or unintentional weight loss.  No change in bowel habits.  Overall feels well from a GI standpoint.  Past Medical History:  Diagnosis Date   Acid reflux    Anxiety    Arthritis    severe in knees and back and hands   Complication of anesthesia    slow to awaken at times   Depression    Fibroid    Breast   Hearing deficit    Heart murmur    Hormone disorder    Lyme disease spring 2019   Pre-diabetes    Seasonal allergies     Past Surgical History:  Procedure Laterality Date   APPENDECTOMY  age 21   CHOLECYSTECTOMY N/A 02/10/2022   Procedure: LAPAROSCOPIC CHOLECYSTECTOMY;  Surgeon: Virl Cagey, MD;  Location: [facility];  Service: General;  Laterality: N/A;   COCHLEAR IMPLANT Right [place] [facility]   [place] N/A 12/01/2018   Procedure: DILATATION & CURETTAGE/HYSTEROSCOPY WITH MYOSURE: BIOPSY OF LABIAL MAJORA;  Surgeon: Megan Salon, MD;  Location: [facility];  Service: Gynecology;  Laterality: N/A;   ESOPHAGOGASTRODUODENOSCOPY N/A 02/06/2014   Procedure: ESOPHAGOGASTRODUODENOSCOPY (EGD);  Surgeon: Rogene Houston, MD;  [facility];  Service: Endoscopy;  Laterality: N/A;  1200   KNEE SURGERY Right 2018   meniscus repair   TUBAL LIGATION  1999    Prior to Admission medications   Medication Sig Start Date End Date Taking? Authorizing Provider  aspirin-acetaminophen-caffeine (EXCEDRIN MIGRAINE) [phone] MG tablet Take 2 tablets by mouth every 6 (six) hours as needed for migraine.   Yes [provider]  cetirizine (ZYRTEC) 10 MG tablet Take 10 mg by mouth at bedtime.   Yes  [provider]  ezetimibe (ZETIA) 10 MG tablet Take 1 tablet (10 mg total) by mouth daily. 12/09/22 12/04/23 Yes BranchAlphonse Guild, MD  hydrocortisone (ANUSOL-HC) 2.5 % rectal cream Place 1 Application rectally daily as needed for hemorrhoids. 07/31/22  Yes [provider]  montelukast (SINGULAIR) 10 MG tablet TAKE 1 TABLET BY MOUTH ONCE A DAY AT BEDTIME. 03/11/20  Yes Valentina Shaggy, MD  Na Sulfate-K Sulfate-Mg Sulf 17.5-3.13-1.6 GM/177ML SOLN As directed 11/16/22  Yes Eva Griffo, Elon Alas, DO  Propylene Glycol (SYSTANE COMPLETE) 0.6 % SOLN Place 1 drop into both eyes daily.   Yes [provider]  sertraline (ZOLOFT) 25 MG tablet Take 25 mg by mouth at bedtime.   Yes [provider]  Vitamin D, Ergocalciferol, (DRISDOL) 1.25 MG (50000 UNIT) CAPS capsule Take 50,000 Units by mouth once a week. 07/16/20  Yes [provider]  EPINEPHrine 0.3 mg/0.3 mL IJ SOAJ injection Inject 0.3 mg into the muscle as needed for anaphylaxis.    [provider]  ibuprofen (ADVIL) 200 MG tablet Take 400 mg by mouth every 6 (six) hours as needed (Joint pain).    [provider]  mometasone (NASONEX) 50 MCG/ACT nasal spray Place 1 spray into the nose daily as needed for allergies. 02/03/22   [provider]  pravastatin (PRAVACHOL) 20 MG tablet Take 1 tablet (20 mg total) by mouth every evening. Patient not taking: Reported on 12/14/2022 10/27/22  Arnoldo Lenis, MD  Rimegepant Sulfate (NURTEC) 75 MG TBDP Take 75 mg by mouth daily as needed (migraine).    [provider]    Allergies as of 11/16/2022 - Review Complete 11/01/2022  Allergen Reaction Noted   Atorvastatin Hives, Itching, Shortness Of Breath, and Swelling 10/20/2022   Bactrim [sulfamethoxazole-trimethoprim] Shortness Of Breath and Other (See Comments) 10/05/2013   Doryx [doxycycline] Shortness Of Breath 10/05/2013   Keflex [cephalexin] Shortness Of Breath 10/05/2013    Alpha-gal Hives 05/24/2018   Other Hives and Itching 02/05/2022   Wound dressing adhesive  02/13/2022   Sincalide Hives, Itching, and Rash 01/26/2022    Family History  Adopted: Yes  Problem Relation Age of Onset   Breast cancer Mother        Late 22's-40's, brain tumor   Lupus Maternal Aunt     Social History   Socioeconomic History   Marital status: Married    Spouse name: Not on file   Number of children: Not on file   Years of education: Not on file   Highest education level: Not on file  Occupational History   Not on file  Tobacco Use   Smoking status: Former    Packs/day: 2.00    Years: 25.00    Total pack years: 50.00    Types: Cigarettes    Passive exposure: Never   Smokeless tobacco: Never   Tobacco comments:    quit 1999  Vaping Use   Vaping Use: Never used  Substance and Sexual Activity   Alcohol use: No    Alcohol/week: 0.0 standard drinks of alcohol   Drug use: No   Sexual activity: Yes    Partners: Male    Birth control/protection: Surgical    Comment: BTL  Other Topics Concern   Not on file  Social History Narrative   Not on file   Social Determinants of Health   Financial Resource Strain: Not on file  Food Insecurity: Not on file  Transportation Needs: Not on file  Physical Activity: Not on file  Stress: Not on file  Social Connections: Not on file  Intimate Partner Violence: Not on file    Review of Systems: See HPI, otherwise negative ROS  Physical Exam: Vital signs in last 24 hours: Temp:  [97.7 F (36.5 C)] 97.7 F (36.5 C) (12/29 0953) Pulse Rate:  [74] 74 (12/29 0953) Resp:  [17] 17 (12/29 0953) BP: (104)/(76) 104/76 (12/29 0953) SpO2:  [96 %] 96 % (12/29 0953) Weight:  [98.4 kg] 98.4 kg (12/29 0953)   General:   Alert,  Well-developed, well-nourished, pleasant and cooperative in NAD Head:  Normocephalic and atraumatic. Eyes:  Sclera clear, no icterus.   Conjunctiva pink. Ears:  Normal auditory acuity. Nose:  No  deformity, discharge,  or lesions. Msk:  Symmetrical without gross deformities. Normal posture. Extremities:  Without clubbing or edema. Neurologic:  Alert and  oriented x4;  grossly normal neurologically. Skin:  Intact without significant lesions or rashes. Psych:  Alert and cooperative. Normal mood and affect.  Impression/Plan: Brandi Farley is here for a colonoscopy to be performed for colon cancer screening purposes.  The risks of the procedure including infection, bleed, or perforation as well as benefits, limitations, alternatives and imponderables have been reviewed with the patient. Questions have been answered. All parties agreeable.

## 2022-12-17 NOTE — Anesthesia Preprocedure Evaluation (Signed)
Anesthesia Evaluation  Patient identified by MRN, date of birth, ID band Patient awake    Reviewed: Allergy & Precautions, NPO status , Patient's Chart, lab work & pertinent test results  History of Anesthesia Complications (+) PROLONGED EMERGENCE and history of anesthetic complications  Airway Mallampati: II  TM Distance: >3 FB Neck ROM: Full    Dental  (+) Dental Advisory Given No notable dental injury:   Pulmonary neg pulmonary ROS, former smoker   Pulmonary exam normal breath sounds clear to auscultation       Cardiovascular Exercise Tolerance: Good Normal cardiovascular exam+ Valvular Problems/Murmurs  Rhythm:Regular Rate:Normal     Neuro/Psych  Headaches PSYCHIATRIC DISORDERS Anxiety Depression     Neuromuscular disease    GI/Hepatic Neg liver ROS,GERD  Controlled and Medicated,,  Endo/Other  diabetes    Renal/GU Renal disease  negative genitourinary   Musculoskeletal  (+) Arthritis , Osteoarthritis,    Abdominal   Peds negative pediatric ROS (+)  Hematology negative hematology ROS (+)   Anesthesia Other Findings   Reproductive/Obstetrics negative OB ROS                             Anesthesia Physical Anesthesia Plan  ASA: 2  Anesthesia Plan: General   Post-op Pain Management: Minimal or no pain anticipated   Induction: Intravenous  PONV Risk Score and Plan: 1 and Propofol infusion  Airway Management Planned: Nasal Cannula and Natural Airway  Additional Equipment:   Intra-op Plan:   Post-operative Plan:   Informed Consent: I have reviewed the patients History and Physical, chart, labs and discussed the procedure including the risks, benefits and alternatives for the proposed anesthesia with the patient or authorized representative who has indicated his/her understanding and acceptance.     Dental advisory given  Plan Discussed with: CRNA and Surgeon  Anesthesia  Plan Comments:         Anesthesia Quick Evaluation

## 2022-12-17 NOTE — Transfer of Care (Signed)
Immediate Anesthesia Transfer of Care Note  Patient: Brandi Farley  Procedure(s) Performed: COLONOSCOPY WITH PROPOFOL  Patient Location: Endoscopy Unit  Anesthesia Type:General  Level of Consciousness: awake, alert , oriented, and patient cooperative  Airway & Oxygen Therapy: Patient Spontanous Breathing  Post-op Assessment: Report given to RN, Post -op Vital signs reviewed and stable, and Patient moving all extremities  Post vital signs: Reviewed and stable  Last Vitals:  Vitals Value Taken Time  BP    Temp    Pulse    Resp    SpO2      Last Pain:  Vitals:   12/17/22 1043  TempSrc:   PainSc: 0-No pain      Patients Stated Pain Goal: 4 (40/35/24 8185)  Complications: No notable events documented.

## 2022-12-17 NOTE — Op Note (Signed)
[facility] Patient Name: Brandi Farley Procedure Date: 12/17/2022 10:32 AM MRN: [id number] Date of Birth: 01/24/1961 Attending MD: Elon Alas. Abbey Chatters , [place], [id number] CSN: [id number] Age: 61 Admit Type: Outpatient Procedure:                Colonoscopy Indications:              Screening for colorectal malignant neoplasm Providers:                Elon Alas. Abbey Chatters, DO, Tammy Vaught, RN, Everardo Pacific Referring MD:              Medicines:                See the Anesthesia note for documentation of the                            administered medications Complications:            No immediate complications. Estimated Blood Loss:     Estimated blood loss: none. Procedure:                Pre-Anesthesia Assessment:                           - The anesthesia plan was to use monitored                            anesthesia care (MAC).                           After obtaining informed consent, the colonoscope                            was passed under direct vision. Throughout the                            procedure, the patient's blood pressure, pulse, and                            oxygen saturations were monitored continuously. The                            [id number]-HQ190L ([id number]) scope was introduced through                            the anus and advanced to the the cecum, identified                            by appendiceal orifice and ileocecal valve. The                            colonoscopy was technically difficult and complex                            due to a redundant colon and significant  looping.                            Successful completion of the procedure was aided by                            applying abdominal pressure. The patient tolerated                            the procedure well. The quality of the bowel                            preparation was evaluated using the BBPS [facility]                            Bowel Preparation  Scale) with scores of: Right                            Colon = 2 (minor amount of residual staining, small                            fragments of stool and/or opaque liquid, but mucosa                            seen well), Transverse Colon = 2 (minor amount of                            residual staining, small fragments of stool and/or                            opaque liquid, but mucosa seen well) and Left Colon                            = 2 (minor amount of residual staining, small                            fragments of stool and/or opaque liquid, but mucosa                            seen well). The total BBPS score equals 6. Fair. Scope In: 10:46:37 AM Scope Out: 11:02:47 AM Scope Withdrawal Time: 0 hours 12 minutes 58 seconds  Total Procedure Duration: 0 hours 16 minutes 10 seconds  Findings:      The perianal and digital rectal examinations were normal.      Non-bleeding internal hemorrhoids were found during endoscopy.      Multiple large-mouthed and small-mouthed diverticula were found in the       sigmoid colon, transverse colon and ascending colon.      A moderate amount of semi-liquid stool was found in the entire colon,       making visualization difficult. Lavage of the area was performed using       copious amounts of sterile water, resulting in clearance with fair       visualization. Impression:               -  Non-bleeding internal hemorrhoids.                           - Diverticulosis in the sigmoid colon, in the                            transverse colon and in the ascending colon.                           - Stool in the entire examined colon.                           - No specimens collected. Moderate Sedation:      Per Anesthesia Care Recommendation:           - Patient has a contact number available for                            emergencies. The signs and symptoms of potential                            delayed complications were discussed with the                             patient. Return to normal activities tomorrow.                            Written discharge instructions were provided to the                            patient.                           - Resume previous diet.                           - Continue present medications.                           - Repeat colonoscopy in 10 years for screening                            purposes.                           - Return to GI clinic PRN. Procedure Code(s):        --- Professional ---                           S0109, Colorectal cancer screening; colonoscopy on                            individual not meeting criteria for high risk Diagnosis Code(s):        --- Professional ---                           Z12.11, Encounter for screening  for malignant                            neoplasm of colon                           K64.8, Other hemorrhoids                           K57.30, Diverticulosis of large intestine without                            perforation or abscess without bleeding CPT copyright 2022 American Medical Association. All rights reserved. The codes documented in this report are preliminary and upon coder review may  be revised to meet current compliance requirements. Elon Alas. Abbey Chatters, DO [place] Abbey Chatters, DO 12/17/2022 11:07:37 AM This report has been signed electronically. Number of Addenda: 0

## 2022-12-17 NOTE — Discharge Instructions (Addendum)

## 2022-12-17 NOTE — Anesthesia Postprocedure Evaluation (Signed)
Anesthesia Post Note  Patient: Brandi Farley  Procedure(s) Performed: COLONOSCOPY WITH PROPOFOL  Patient location during evaluation: Phase II Anesthesia Type: General Level of consciousness: awake and alert and oriented Pain management: pain level controlled Vital Signs Assessment: post-procedure vital signs reviewed and stable Respiratory status: spontaneous breathing, nonlabored ventilation and respiratory function stable Cardiovascular status: blood pressure returned to baseline and stable Postop Assessment: no apparent nausea or vomiting Anesthetic complications: no  No notable events documented.   Last Vitals:  Vitals:   12/17/22 0953 12/17/22 1107  BP: 104/76 104/70  Pulse: 74 77  Resp: 17 (!) 22  Temp: 36.5 C (!) 36.4 C  SpO2: 96% 97%    Last Pain:  Vitals:   12/17/22 1107  TempSrc: Axillary  PainSc: 0-No pain                 Saxon Crosby C Aidan Caloca

## 2022-12-24 ENCOUNTER — Encounter (HOSPITAL_COMMUNITY): Payer: Self-pay | Admitting: Internal Medicine

## 2023-02-09 ENCOUNTER — Encounter: Payer: Self-pay | Admitting: Gastroenterology

## 2023-02-09 ENCOUNTER — Ambulatory Visit (INDEPENDENT_AMBULATORY_CARE_PROVIDER_SITE_OTHER): Payer: BC Managed Care – PPO | Admitting: Gastroenterology

## 2023-02-09 VITALS — BP 112/72 | HR 80 | Temp 97.5°F | Ht 67.0 in | Wt 221.4 lb

## 2023-02-09 DIAGNOSIS — K580 Irritable bowel syndrome with diarrhea: Secondary | ICD-10-CM | POA: Diagnosis not present

## 2023-02-09 DIAGNOSIS — R109 Unspecified abdominal pain: Secondary | ICD-10-CM | POA: Insufficient documentation

## 2023-02-09 DIAGNOSIS — R1010 Upper abdominal pain, unspecified: Secondary | ICD-10-CM | POA: Diagnosis not present

## 2023-02-09 DIAGNOSIS — R143 Flatulence: Secondary | ICD-10-CM | POA: Diagnosis not present

## 2023-02-09 DIAGNOSIS — K219 Gastro-esophageal reflux disease without esophagitis: Secondary | ICD-10-CM | POA: Diagnosis not present

## 2023-02-09 MED ORDER — HYOSCYAMINE SULFATE 0.125 MG PO TBDP: 0.1250 mg | ORAL_TABLET | Freq: Four times a day (QID) | ORAL | 0 refills | Status: DC | PRN

## 2023-02-09 NOTE — Patient Instructions (Addendum)
Please complete labs at [facility]. We will be in touch with results as available. Decrease fiber supplement to 4-6 grams per day. Add Anaspaz one under the tongue up to four times daily for abdominal pain, loose stools.  Continue Nexium daily before breakfast until you are feeling better.  Keep me updated via mychart on how you are doing.    It was a pleasure to see you today. I want to create trusting relationships with patients and provide genuine, compassionate, and quality care. I truly value your feedback, so please be on the lookout for a survey regarding your visit with me today. I appreciate your time in completing this!

## 2023-02-09 NOTE — Progress Notes (Signed)
GI Office Note    Referring Provider: Celene Squibb, MD Primary Care Physician:  Celene Squibb, MD  Primary Gastroenterologist: Elon Alas. Abbey Chatters, DO   Chief Complaint   Chief Complaint  Patient presents with   Gas    Having a lot of gas, abdominal pain and nausea when eating.     History of Present Illness   Brandi Farley is a 62 y.o. female presenting today for same day appointment. Last seen in 10/2022. Patient has h/o Alpha-Gal, diverticulitis, IBS.  Since last OV, she completed labs and gi profile. All unremarkable. CRP, lipase, CMET, CBC all normal. She updated colonoscopy.  Had been doing well since her colonoscopy. Saturday started having a lot of gas, stools were normal. Took gas-x. Sunday, started having diarrhea and nausea/salivating. Never vomited. Also with upper abdominal pain with radiating into right shoulder blade. Reminded her of her gallbladder attack. Can't eat due to pain and nausea. Yesterday however she was able to tolerate eggs/toast. Has been drinking water, coffee. This morning combination of gas/diarrhea. Has had 3 loose stools. Did not try eating because of pain/nausea. No melena, brbpr. No dysuria. No fever. She started back on her Nexium Monday/Tuesday because she thought some of her symptoms may be acid related.   Taking fiber supplement twice daily, she is not sure but she believes she may be getting 12 grams of fiber in her supplement.   Colonoscopy 11/2022: -nonbleeding internal hemorrhoids -diverticulosis -stool in entire colon, lavage with copious sterile water resulting in clearance with fiar visualization -next colonoscopy 10 years  EGD February 2015: Small sliding hiatal hernia. Erosive antral gastritis but no evidence of peptic ulcer disease.  H. pylori serologies negative.  Medications   Current Outpatient Medications  Medication Sig Dispense Refill   aspirin-acetaminophen-caffeine (EXCEDRIN MIGRAINE) 250-250-65 MG tablet Take  2 tablets by mouth every 6 (six) hours as needed for migraine.     cetirizine (ZYRTEC) 10 MG tablet Take 10 mg by mouth at bedtime.     EPINEPHrine 0.3 mg/0.3 mL IJ SOAJ injection Inject 0.3 mg into the muscle as needed for anaphylaxis.     ezetimibe (ZETIA) 10 MG tablet Take 1 tablet (10 mg total) by mouth daily. 90 tablet 3   hydrocortisone (ANUSOL-HC) 2.5 % rectal cream Place 1 Application rectally daily as needed for hemorrhoids.     ibuprofen (ADVIL) 200 MG tablet Take 400 mg by mouth every 6 (six) hours as needed (Joint pain).     mometasone (NASONEX) 50 MCG/ACT nasal spray Place 1 spray into the nose daily as needed for allergies.     montelukast (SINGULAIR) 10 MG tablet TAKE 1 TABLET BY MOUTH ONCE A DAY AT BEDTIME. 30 tablet 0   Propylene Glycol (SYSTANE COMPLETE) 0.6 % SOLN Place 1 drop into both eyes daily.     Rimegepant Sulfate (NURTEC) 75 MG TBDP Take 75 mg by mouth daily as needed (migraine).     sertraline (ZOLOFT) 25 MG tablet Take 25 mg by mouth at bedtime.     Vitamin D, Ergocalciferol, (DRISDOL) 1.25 MG (50000 UNIT) CAPS capsule Take 50,000 Units by mouth once a week.     No current facility-administered medications for this visit.    Allergies   Allergies as of 02/09/2023 - Review Complete 02/09/2023  Allergen Reaction Noted   Atorvastatin Hives, Itching, Shortness Of Breath, and Swelling 10/20/2022   Bactrim [sulfamethoxazole-trimethoprim] Shortness Of Breath and Other (See Comments) 10/05/2013   Doryx [doxycycline] Shortness  Of Breath 10/05/2013   Keflex [cephalexin] Shortness Of Breath 10/05/2013   Alpha-gal Hives 05/24/2018   Iodinated contrast media Other (See Comments) 12/14/2022   Other Hives and Itching 02/05/2022   Pravastatin  12/17/2022   Wound dressing adhesive  02/13/2022   Sincalide Hives, Itching, and Rash 01/26/2022      Review of Systems   General: Negative for anorexia, weight loss, fever, chills, fatigue, weakness. ENT: Negative for  hoarseness, difficulty swallowing , nasal congestion. CV: Negative for chest pain, angina, palpitations, dyspnea on exertion, peripheral edema.  Respiratory: Negative for dyspnea at rest, dyspnea on exertion, cough, sputum, wheezing.  GI: See history of present illness. GU:  Negative for dysuria, hematuria, urinary incontinence, urinary frequency, nocturnal urination. Urine dark, ?dehydration Endo: Negative for unusual weight change.     Physical Exam   BP 112/72 (BP Location: Right Arm, Patient Position: Sitting, Cuff Size: Large)   Pulse 80   Temp (!) 97.5 F (36.4 C) (Oral)   Ht 5' 7"$  (1.702 m)   Wt 221 lb 6.4 oz (100.4 kg)   LMP 11/10/2018   SpO2 95%   BMI 34.68 kg/m    General: Well-nourished, well-developed in no acute distress.  Eyes: No icterus. Mouth: Oropharyngeal mucosa moist and pink  Abdomen: Bowel sounds are normal, nontender, nondistended, no hepatosplenomegaly or masses, no abdominal bruits or hernia , no rebound or guarding.  Rectal: not performed Extremities: No lower extremity edema. No clubbing or deformities. Neuro: Alert and oriented x 4   Skin: Warm and dry, no jaundice.   Psych: Alert and cooperative, normal mood and affect.  Labs   Lab Results  Component Value Date   CREATININE 0.69 11/01/2022   BUN 6 11/01/2022   NA 143 11/01/2022   K 4.1 11/01/2022   CL 104 11/01/2022   CO2 24 11/01/2022   Lab Results  Component Value Date   WBC 8.6 11/01/2022   HGB 14.1 11/01/2022   HCT 43.7 11/01/2022   MCV 87 11/01/2022   PLT 353 11/01/2022   Lab Results  Component Value Date   ALT 26 11/01/2022   AST 32 11/01/2022   ALKPHOS 90 11/01/2022   BILITOT 0.4 11/01/2022    Imaging Studies   No results found.  Assessment   Upper abdominal pain/gas/GERD: recent onset symptoms.  Reminiscent of previous gallbladder symptoms.  Pain radiating into the right shoulder blade.  Unable to eat due to pain and nausea.  Abdominal symptoms have felt acid related  therefore she started Nexium on Monday.  Having a lot of belching/flatulence some diarrhea.  Symptoms do not necessarily sound like acute gastroenteritis.  Gallbladder removed last year with cholelithiasis, need to consider possibility of retained gallstone.  Symptoms could also be exacerbated by taking too much fiber supplement or an IBS.  Very alpha gal exposure.    PLAN   Patient to complete labs at [facility] today.   Decrease fiber supplement to 4 to 6 g/day.   Head hyoscyamine 0.125 mg sublingual up to 4 times daily for abdominal pain, loose stools.   Continue Nexium 40 mg daily.   Patient prefers using MyChart because she is hard of hearing and is easier needs acute medication for her.  She will keep me updated via MyChart on her progress.     Laureen Ochs. Bobby Rumpf, [place], [facility]

## 2023-02-10 ENCOUNTER — Other Ambulatory Visit: Payer: Self-pay | Admitting: Gastroenterology

## 2023-02-10 LAB — CBC WITH DIFFERENTIAL/PLATELET
Basophils Absolute: 0.1 10*3/uL (ref 0.0–0.2)
Basos: 0 %
EOS (ABSOLUTE): 0.1 10*3/uL (ref 0.0–0.4)
Eos: 1 %
Hematocrit: 45.2 % (ref 34.0–46.6)
Hemoglobin: 14.7 g/dL (ref 11.1–15.9)
Immature Grans (Abs): 0.2 10*3/uL — ABNORMAL HIGH (ref 0.0–0.1)
Immature Granulocytes: 1 %
Lymphocytes Absolute: 1.9 10*3/uL (ref 0.7–3.1)
Lymphs: 11 %
MCH: 28.4 pg (ref 26.6–33.0)
MCHC: 32.5 g/dL (ref 31.5–35.7)
MCV: 87 fL (ref 79–97)
Monocytes Absolute: 1.4 10*3/uL — ABNORMAL HIGH (ref 0.1–0.9)
Monocytes: 8 %
Neutrophils Absolute: 14.2 10*3/uL — ABNORMAL HIGH (ref 1.4–7.0)
Neutrophils: 79 %
Platelets: 343 10*3/uL (ref 150–450)
RBC: 5.18 x10E6/uL (ref 3.77–5.28)
RDW: 13.5 % (ref 11.7–15.4)
WBC: 17.9 10*3/uL — ABNORMAL HIGH (ref 3.4–10.8)

## 2023-02-10 LAB — COMPREHENSIVE METABOLIC PANEL
ALT: 26 IU/L (ref 0–32)
AST: 24 IU/L (ref 0–40)
Albumin/Globulin Ratio: 1.4 (ref 1.2–2.2)
Albumin: 4.1 g/dL (ref 3.8–4.9)
Alkaline Phosphatase: 99 IU/L (ref 44–121)
BUN/Creatinine Ratio: 17 (ref 9–23)
BUN: 10 mg/dL (ref 6–24)
Bilirubin Total: 0.5 mg/dL (ref 0.0–1.2)
CO2: 22 mmol/L (ref 20–29)
Calcium: 9.2 mg/dL (ref 8.7–10.2)
Chloride: 101 mmol/L (ref 96–106)
Creatinine, Ser: 0.6 mg/dL (ref 0.57–1.00)
Globulin, Total: 3 g/dL (ref 1.5–4.5)
Glucose: 77 mg/dL (ref 70–99)
Potassium: 4.8 mmol/L (ref 3.5–5.2)
Sodium: 141 mmol/L (ref 134–144)
Total Protein: 7.1 g/dL (ref 6.0–8.5)
eGFR: 103 mL/min/{1.73_m2} (ref >59)

## 2023-02-10 LAB — LIPASE: Lipase: 23 U/L (ref 14–72)

## 2023-02-10 MED ORDER — CIPROFLOXACIN HCL 500 MG PO TABS: 500.0000 mg | ORAL_TABLET | Freq: Two times a day (BID) | ORAL | 0 refills | Status: AC

## 2023-02-10 MED ORDER — METRONIDAZOLE 500 MG PO TABS: 500.0000 mg | ORAL_TABLET | Freq: Three times a day (TID) | ORAL | 0 refills | Status: AC

## 2023-02-14 NOTE — Telephone Encounter (Signed)
Pt states that she is no longer having any abdominal pain since throwing up. Pt is wanting to just monitor it for now instead of going for the CT/labs.

## 2023-02-14 NOTE — Telephone Encounter (Signed)
Lmom for pt to return my call.  

## 2023-02-14 NOTE — Telephone Encounter (Signed)
Please call patient this morning. I think she needs to go to the ER if she is having ongoing severe abdominal pain. If her abdominal pain has let up, then I want her to go for STAT labs and STAT CT A/P with contrast. Labs: CBC, CMET, lipase. ASK WHAT KIND OF REACTION SHE HAD WITH CONTRAST USED FOR CTS, IT IS NOW LISTED AS NEW ALLERGY BUT NO REACTION LISTED AND SHE HAS HAD IV AND ORAL CONTRAST BEFORE LAST YEAR.   Dx: Upper abdominal pain, leukocytosis, vomiting.   Recommend continuing cipro/flagy. She can use Pepcid AC for gas.

## 2023-02-15 ENCOUNTER — Telehealth: Payer: Self-pay | Admitting: Gastroenterology

## 2023-02-15 NOTE — Telephone Encounter (Signed)
I have a cancellation tomorrow at 9:30. Let's see if we can do an in person visit with her or a virtual.

## 2023-02-15 NOTE — Telephone Encounter (Signed)
Please see pt myChart message.

## 2023-02-15 NOTE — Telephone Encounter (Signed)
Patient called to make an appt with Magda Paganini but nothing available until March 11.  She said that "there are things going on" and she wanted to get an appointment to get things going.  I saw where she was just here last week.  I offered to add to the wait ilst but she wanted me to put a note in that she was calling.

## 2023-02-15 NOTE — Telephone Encounter (Signed)
I communicated with patient via mychart and she is going to come in at 9:30 Wednesday. Please put her on schedule.

## 2023-02-16 ENCOUNTER — Encounter: Payer: Self-pay | Admitting: Gastroenterology

## 2023-02-16 ENCOUNTER — Ambulatory Visit (INDEPENDENT_AMBULATORY_CARE_PROVIDER_SITE_OTHER): Payer: BC Managed Care – PPO | Admitting: Gastroenterology

## 2023-02-16 ENCOUNTER — Telehealth: Payer: Self-pay | Admitting: *Deleted

## 2023-02-16 VITALS — BP 122/77 | HR 73 | Temp 97.9°F | Ht 67.0 in | Wt 223.6 lb

## 2023-02-16 DIAGNOSIS — D72829 Elevated white blood cell count, unspecified: Secondary | ICD-10-CM | POA: Insufficient documentation

## 2023-02-16 DIAGNOSIS — R112 Nausea with vomiting, unspecified: Secondary | ICD-10-CM | POA: Insufficient documentation

## 2023-02-16 DIAGNOSIS — K5732 Diverticulitis of large intestine without perforation or abscess without bleeding: Secondary | ICD-10-CM

## 2023-02-16 DIAGNOSIS — R1032 Left lower quadrant pain: Secondary | ICD-10-CM | POA: Diagnosis not present

## 2023-02-16 DIAGNOSIS — R1010 Upper abdominal pain, unspecified: Secondary | ICD-10-CM | POA: Diagnosis not present

## 2023-02-16 HISTORY — DX: Nausea with vomiting, unspecified: R11.2

## 2023-02-16 MED ORDER — DIPHENHYDRAMINE HCL 50 MG PO CAPS: ORAL_CAPSULE | ORAL | 0 refills | Status: DC

## 2023-02-16 MED ORDER — PREDNISONE 50 MG PO TABS: ORAL_TABLET | ORAL | 0 refills | Status: DC

## 2023-02-16 NOTE — Patient Instructions (Addendum)
Please have your labs done at the hospital at the same time as your CT scan. CT scan of your abdomen to be complete.  Take Nexium once daily before a meal. Continue Cipro and Flagyl.  Stick with a soft diet for now.  Try to drink enough fluids to keep urine light in color.

## 2023-02-16 NOTE — Progress Notes (Signed)
GI Office Note    Referring Provider: Celene Squibb, MD Primary Care Physician:  Celene Squibb, MD  Primary Gastroenterologist: Elon Alas. Abbey Chatters, DO   Chief Complaint   Chief Complaint  Patient presents with   Follow-up    Discuss labs and imaging.    History of Present Illness   Brandi Farley is a 62 y.o. female presenting today for urgent follow up. Last seen 02/09/23. H/o Alpha-Gal, diverticulitis, IBS.   At time of last OV, she had acute onset upper abdominal pain/gas/nausea. Stools were normal at that time. Symptoms reminded her of gallbladder attack. She had cholecystectomy last year. Labs showed WBC 17,900. LFTs, lipase normal. She resumed her Nexium, added Anaspaz. She have been ongoing symptoms. We discussed possible CT but she declined. We did start Cipro/Flagyl for possible diverticulitis (h/o pancolonic diverticulosis). Evening of 2/25 she had excruciating abdominal pain, symptoms subsided temporarily after episode of vomiting. We encouraged her to have stat labs and CT but she declined as she felt someone improved. Continues to struggle with oral intake.   Now postprandial diarrhea, 3-4 times per day. Not eating well because of diarrhea and abdominal pain. Sticking to Gatorade and other liquids.  Held off on fiber supplements for now.  Pain migrates between epigastric region and now today has some left lower quadrant pain.  She states her left lower quadrant pain has improved somewhat after having diarrhea this morning.  Feels very weak and fatigued.  Has been out of work this week but went back in today. Last night woke up with diarrhea. No brbpr. Black stools since on antibiotics. All diarrhea since on antibiotics.  Denies Pepto-Bismol.  She tried Pepcid to see if would help with her abdominal pain and gas but it did not.  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  She is quit both Pepcid and Anaspaz as well as Nexium.  Typically takes Nexium only when needed.  Started for the first 3 to 4  days of onset of the symptoms since stopped a   Colonoscopy 11/2022: -nonbleeding internal hemorrhoids -diverticulosis -stool in entire colon, lavage with copious sterile water resulting in clearance with fiar visualization -next colonoscopy 10 years   EGD February 2015: Small sliding hiatal hernia. Erosive antral gastritis but no evidence of peptic ulcer disease.  H. pylori serologies negative.    Medications   Current Outpatient Medications  Medication Sig Dispense Refill   aspirin-acetaminophen-caffeine (EXCEDRIN MIGRAINE) 250-250-65 MG tablet Take 2 tablets by mouth every 6 (six) hours as needed for migraine.     cetirizine (ZYRTEC) 10 MG tablet Take 10 mg by mouth at bedtime.     ciprofloxacin (CIPRO) 500 MG tablet Take 1 tablet (500 mg total) by mouth 2 (two) times daily for 10 days. 20 tablet 0   EPINEPHrine 0.3 mg/0.3 mL IJ SOAJ injection Inject 0.3 mg into the muscle as needed for anaphylaxis.     ezetimibe (ZETIA) 10 MG tablet Take 1 tablet (10 mg total) by mouth daily. 90 tablet 3   hydrocortisone (ANUSOL-HC) 2.5 % rectal cream Place 1 Application rectally daily as needed for hemorrhoids.     ibuprofen (ADVIL) 200 MG tablet Take 400 mg by mouth every 6 (six) hours as needed (Joint pain).     metroNIDAZOLE (FLAGYL) 500 MG tablet Take 1 tablet (500 mg total) by mouth 3 (three) times daily for 10 days. 30 tablet 0   mometasone (NASONEX) 50 MCG/ACT nasal spray Place 1 spray into the nose daily  as needed for allergies.     montelukast (SINGULAIR) 10 MG tablet TAKE 1 TABLET BY MOUTH ONCE A DAY AT BEDTIME. 30 tablet 0   Propylene Glycol (SYSTANE COMPLETE) 0.6 % SOLN Place 1 drop into both eyes daily.     Rimegepant Sulfate (NURTEC) 75 MG TBDP Take 75 mg by mouth daily as needed (migraine).     sertraline (ZOLOFT) 25 MG tablet Take 25 mg by mouth at bedtime.     Vitamin D, Ergocalciferol, (DRISDOL) 1.25 MG (50000 UNIT) CAPS capsule Take 50,000 Units by mouth once a week.     No  current facility-administered medications for this visit.    Allergies   Allergies as of 02/16/2023 - Review Complete 02/16/2023  Allergen Reaction Noted   Atorvastatin Hives, Itching, Shortness Of Breath, and Swelling 10/20/2022   Bactrim [sulfamethoxazole-trimethoprim] Shortness Of Breath and Other (See Comments) 10/05/2013   Doryx [doxycycline] Shortness Of Breath 10/05/2013   Keflex [cephalexin] Shortness Of Breath 10/05/2013   Alpha-gal Hives 05/24/2018   Iodinated contrast media Other (See Comments) 12/14/2022   Other Hives and Itching 02/05/2022   Pravastatin  12/17/2022   Wound dressing adhesive  02/13/2022   Sincalide Hives, Itching, and Rash 01/26/2022     Past Medical History   Past Medical History:  Diagnosis Date   Acid reflux    Anxiety    Arthritis    severe in knees and back and hands   Complication of anesthesia    slow to awaken at times   Depression    Fibroid    Breast   Hearing deficit    Heart murmur    Hormone disorder    Lyme disease spring 2019   Pre-diabetes    Seasonal allergies     Past Surgical History   Past Surgical History:  Procedure Laterality Date   APPENDECTOMY  age 29   CHOLECYSTECTOMY N/A 02/10/2022   Procedure: LAPAROSCOPIC CHOLECYSTECTOMY;  Surgeon: Virl Cagey, MD;  Location: [facility];  Service: General;  Laterality: N/A;   COCHLEAR IMPLANT Right 2016 [facility]   COLONOSCOPY WITH PROPOFOL N/A 12/17/2022   Procedure: COLONOSCOPY WITH PROPOFOL;  Surgeon: Eloise Harman, DO;  [facility];  Service: Endoscopy;  Laterality: N/A;  11:00am, asa 2   DILATATION & CURETTAGE/HYSTEROSCOPY WITH MYOSURE N/A 12/01/2018   Procedure: DILATATION & CURETTAGE/HYSTEROSCOPY WITH MYOSURE: BIOPSY OF LABIAL MAJORA;  Surgeon: Megan Salon, MD;  Location: [facility];  Service: Gynecology;  Laterality: N/A;   ESOPHAGOGASTRODUODENOSCOPY N/A 02/06/2014   Procedure: ESOPHAGOGASTRODUODENOSCOPY (EGD);  Surgeon: Rogene Houston, MD;  [facility];  Service: Endoscopy;  Laterality: N/A;  1200   KNEE SURGERY Right 2018   meniscus repair   TUBAL LIGATION  1999    Past Family History   Family History  Adopted: Yes  Problem Relation Age of Onset   Breast cancer Mother        Late 53's-40's, brain tumor   Lupus Maternal Aunt     Past Social History   Social History   Socioeconomic History   Marital status: Married    Spouse name: Not on file   Number of children: Not on file   Years of education: Not on file   Highest education level: Not on file  Occupational History   Not on file  Tobacco Use   Smoking status: Former    Packs/day: 2.00    Years: 25.00    Total pack years: 50.00  Types: Cigarettes    Passive exposure: Never   Smokeless tobacco: Never   Tobacco comments:    quit 1999  Vaping Use   Vaping Use: Never used  Substance and Sexual Activity   Alcohol use: No    Alcohol/week: 0.0 standard drinks of alcohol   Drug use: No   Sexual activity: Yes    Partners: Male    Birth control/protection: Surgical    Comment: BTL  Other Topics Concern   Not on file  Social History Narrative   Not on file   Social Determinants of Health   Financial Resource Strain: Not on file  Food Insecurity: Not on file  Transportation Needs: Not on file  Physical Activity: Not on file  Stress: Not on file  Social Connections: Not on file  Intimate Partner Violence: Not on file    Review of Systems   General: Negative for anorexia, weight loss, fever, chills, fatigue, weakness. ENT: Negative for hoarseness, difficulty swallowing , nasal congestion. CV: Negative for chest pain, angina, palpitations, dyspnea on exertion, peripheral edema.  Respiratory: Negative for dyspnea at rest, dyspnea on exertion, cough, sputum, wheezing.  GI: See history of present illness. GU:  Negative for dysuria, hematuria, urinary incontinence, urinary frequency, nocturnal urination.  Endo: Negative  for unusual weight change.     Physical Exam   BP 122/77 (BP Location: Right Arm, Patient Position: Sitting, Cuff Size: Large)   Pulse 73   Temp 97.9 F (36.6 C) (Oral)   Ht '5\' 7"'$  (1.702 m)   Wt 223 lb 9.6 oz (101.4 kg)   LMP 11/10/2018   SpO2 97%   BMI 35.02 kg/m    General: Well-nourished, well-developed in no acute distress.  Eyes: No icterus. Mouth: Oropharyngeal mucosa moist and pink   Abdomen: Bowel sounds are normal, nondistended, no hepatosplenomegaly or masses,  no abdominal bruits or hernia , no rebound or guarding. Mild to tenderness in the epigastric and left lower quadrant Rectal: not performed Extremities: No lower extremity edema. No clubbing or deformities. Neuro: Alert and oriented x 4   Skin: Warm and dry, no jaundice.   Psych: Alert and cooperative, normal mood and affect.  Labs   Lab Results  Component Value Date   CREATININE 0.60 02/09/2023   BUN 10 02/09/2023   NA 141 02/09/2023   K 4.8 02/09/2023   CL 101 02/09/2023   CO2 22 02/09/2023   Lab Results  Component Value Date   ALT 26 02/09/2023   AST 24 02/09/2023   ALKPHOS 99 02/09/2023   BILITOT 0.5 02/09/2023   Lab Results  Component Value Date   WBC 17.9 (H) 02/09/2023   HGB 14.7 02/09/2023   HCT 45.2 02/09/2023   MCV 87 02/09/2023   PLT 343 02/09/2023   .lastlft Imaging Studies   No results found.  Assessment   Abdominal pain: Initially mostly epigastric/right upper quadrant pain but now has left lower quadrant pain as well.  Started antibiotics empirically for possible diverticulitis given history of pancolonic diverticulosis.  Excruciating pain over the weekend, almost went to the ED.  We encouraged CT at that time but she declined as temporarily she was feeling better after an episode of vomiting.  At this time etiology of her symptoms unclear.  Diverticulitis remains a possibility.  Cannot exclude upper GI symptoms secondary to gastritis or peptic ulcer disease although this would  not explain her left lower quadrant pain or diarrhea.  Diarrhea could be secondary to antibiotic use.  Given  leukocytosis, persistent abdominal pain, diarrhea, inability to eat due to the symptoms, recommend CT abdomen pelvis and update labs.  If unremarkable next step would be upper endoscopy.  Patient with history of left ovarian cyst noted on 06/2022 CT and on prior studies. Reevaluate at time of upcoming CT.    PLAN   CBC, CMET CT abdomen pelvis with contrast within next 24 hours.  Resume Nexium daily. Continue cipro/flagyl.   Laureen Ochs. Bobby Rumpf, [place], [facility]

## 2023-02-16 NOTE — Telephone Encounter (Signed)
Spoke to pt about having allergy to iodinate contrast media and she says she honestly can't remember if she had a reaction. She says possibly small rash around IV site but she has never had the oral contrast.

## 2023-02-16 NOTE — Telephone Encounter (Signed)
Noted. Pt was put on the schedule.

## 2023-02-16 NOTE — Telephone Encounter (Signed)
Pt has been scheduled for CT for tomorrow morning, arrive at 7 am to [place] 3 hours prior. Pt has been informed of procedure date and time. Also informed pt of medication sent to pharmacy to take prior to having CT done. Pt verbalized understanding.

## 2023-02-16 NOTE — Telephone Encounter (Signed)
[place] PA: CT A/P Order ID: [id number]       Authorized  Approval Valid Through: 02/16/2023 - 03/17/2023

## 2023-02-16 NOTE — Telephone Encounter (Signed)
No documented reaction on CT reports.  To be safe, let's premedicate with   Benadryl '50mg'$  1 hour before CT Prednisone '50mg'$  13 hours, 7 hours, 1 hour before CT.   I have sent in RX to [facility]. Please let pt know.

## 2023-02-17 ENCOUNTER — Ambulatory Visit (HOSPITAL_COMMUNITY)
Admission: RE | Admit: 2023-02-17 | Discharge: 2023-02-17 | Disposition: A | Discharge status: Home / Self Care | Payer: BC Managed Care – PPO | Source: Ambulatory Visit | Attending: Gastroenterology | Admitting: Gastroenterology

## 2023-02-17 ENCOUNTER — Encounter: Payer: Self-pay | Admitting: Radiology

## 2023-02-17 DIAGNOSIS — R1032 Left lower quadrant pain: Secondary | ICD-10-CM | POA: Insufficient documentation

## 2023-02-17 MED ORDER — IOHEXOL 300 MG/ML  SOLN
100.0000 mL | Freq: Once | INTRAMUSCULAR | Status: AC | PRN
  Administered 2023-02-17: 100 mL via INTRAVENOUS

## 2023-02-18 LAB — COMPREHENSIVE METABOLIC PANEL
ALT: 44 IU/L — ABNORMAL HIGH (ref 0–32)
AST: 32 IU/L (ref 0–40)
Albumin/Globulin Ratio: 1.4 (ref 1.2–2.2)
Albumin: 4.1 g/dL (ref 3.8–4.9)
Alkaline Phosphatase: 92 IU/L (ref 44–121)
BUN/Creatinine Ratio: 13 (ref 9–23)
BUN: 9 mg/dL (ref 6–24)
Bilirubin Total: 0.3 mg/dL (ref 0.0–1.2)
CO2: 19 mmol/L — ABNORMAL LOW (ref 20–29)
Calcium: 9.5 mg/dL (ref 8.7–10.2)
Chloride: 102 mmol/L (ref 96–106)
Creatinine, Ser: 0.67 mg/dL (ref 0.57–1.00)
Globulin, Total: 3 g/dL (ref 1.5–4.5)
Glucose: 165 mg/dL — ABNORMAL HIGH (ref 70–99)
Potassium: 4.4 mmol/L (ref 3.5–5.2)
Sodium: 139 mmol/L (ref 134–144)
Total Protein: 7.1 g/dL (ref 6.0–8.5)
eGFR: 101 mL/min/{1.73_m2} (ref >59)

## 2023-02-18 LAB — CBC WITH DIFFERENTIAL/PLATELET
Basophils Absolute: 0 10*3/uL (ref 0.0–0.2)
Basos: 0 %
EOS (ABSOLUTE): 0 10*3/uL (ref 0.0–0.4)
Eos: 0 %
Hematocrit: 44.9 % (ref 34.0–46.6)
Hemoglobin: 14.6 g/dL (ref 11.1–15.9)
Immature Grans (Abs): 0 10*3/uL (ref 0.0–0.1)
Immature Granulocytes: 0 %
Lymphocytes Absolute: 1.1 10*3/uL (ref 0.7–3.1)
Lymphs: 9 %
MCH: 28 pg (ref 26.6–33.0)
MCHC: 32.5 g/dL (ref 31.5–35.7)
MCV: 86 fL (ref 79–97)
Monocytes Absolute: 0.1 10*3/uL (ref 0.1–0.9)
Monocytes: 1 %
Neutrophils Absolute: 11.3 10*3/uL — ABNORMAL HIGH (ref 1.4–7.0)
Neutrophils: 90 %
Platelets: 392 10*3/uL (ref 150–450)
RBC: 5.21 x10E6/uL (ref 3.77–5.28)
RDW: 13.3 % (ref 11.7–15.4)
WBC: 12.6 10*3/uL — ABNORMAL HIGH (ref 3.4–10.8)

## 2023-03-09 ENCOUNTER — Encounter: Payer: Self-pay | Admitting: Cardiology

## 2023-03-10 NOTE — Telephone Encounter (Signed)
Zetia would not help prevent chest pains, would only work to lower cholesterol over time. The 10mg  dose is the only dose that is prescribed. Her coronary CTA was benign without evidence of significant blockages in the arteries of the heart. Needs to f/u with pcp to consider noncardiac causes, can have a PA appt in next few weeks to reassess as well   Zandra Abts MD

## 2023-05-06 ENCOUNTER — Telehealth: Payer: Self-pay | Admitting: Cardiology

## 2023-05-06 ENCOUNTER — Telehealth: Payer: Self-pay

## 2023-05-06 NOTE — Telephone Encounter (Signed)
Primary Cardiologist:Branch, Christiane Ha, MD   Preoperative team, please contact this patient and set up a phone call appointment for further preoperative risk assessment. Please obtain consent and complete medication review. Thank you for your help.   I confirm that guidance regarding antiplatelet and oral anticoagulation therapy has been completed and, if necessary, noted below (none requested).   Levi Aland, NP-C  05/06/2023, 12:35 PM [address] Office [phone] Fax [phone]

## 2023-05-06 NOTE — Telephone Encounter (Signed)
  Patient Consent for Virtual Visit        Brandi Farley has provided verbal consent on 05/06/2023 for a virtual visit (video or telephone).   CONSENT FOR VIRTUAL VISIT FOR:  Brandi Farley  By participating in this virtual visit I agree to the following:  I hereby voluntarily request, consent and authorize [facility] and its employed or contracted physicians, physician assistants, nurse practitioners or other licensed health care professionals (the Practitioner), to provide me with telemedicine health care services (the "Services") as deemed necessary by the treating Practitioner. I acknowledge and consent to receive the Services by the Practitioner via telemedicine. I understand that the telemedicine visit will involve communicating with the Practitioner through live audiovisual communication technology and the disclosure of certain medical information by electronic transmission. I acknowledge that I have been given the opportunity to request an in-person assessment or other available alternative prior to the telemedicine visit and am voluntarily participating in the telemedicine visit.  I understand that I have the right to withhold or withdraw my consent to the use of telemedicine in the course of my care at any time, without affecting my right to future care or treatment, and that the Practitioner or I may terminate the telemedicine visit at any time. I understand that I have the right to inspect all information obtained and/or recorded in the course of the telemedicine visit and may receive copies of available information for a reasonable fee.  I understand that some of the potential risks of receiving the Services via telemedicine include:  Delay or interruption in medical evaluation due to technological equipment failure or disruption; Information transmitted may not be sufficient (e.g. poor resolution of images) to allow for appropriate medical decision making by the  Practitioner; and/or  In rare instances, security protocols could fail, causing a breach of personal health information.  Furthermore, I acknowledge that it is my responsibility to provide information about my medical history, conditions and care that is complete and accurate to the best of my ability. I acknowledge that Practitioner's advice, recommendations, and/or decision may be based on factors not within their control, such as incomplete or inaccurate data provided by me or distortions of diagnostic images or specimens that may result from electronic transmissions. I understand that the practice of medicine is not an exact science and that Practitioner makes no warranties or guarantees regarding treatment outcomes. I acknowledge that a copy of this consent can be made available to me via my patient portal [facility] MyChart), or I can request a printed copy by calling the office of [facility].    I understand that my insurance will be billed for this visit.   I have read or had this consent read to me. I understand the contents of this consent, which adequately explains the benefits and risks of the Services being provided via telemedicine.  I have been provided ample opportunity to ask questions regarding this consent and the Services and have had my questions answered to my satisfaction. I give my informed consent for the services to be provided through the use of telemedicine in my medical care

## 2023-05-06 NOTE — Telephone Encounter (Signed)
   Pre-operative Risk Assessment    Patient Name: Brandi Farley  DOB: 24-Sep-1961 MRN: [id number]      Request for Surgical Clearance    Procedure:   Right total knee arthroplasty  Date of Surgery:  Clearance 05/23/23                                 Surgeon:  Dr. Durene Romans Surgeon's Group or Practice Name:  [facility] Phone number:  [phone] Fax number:  [phone]   Type of Clearance Requested:   - Medical  - Pharmacy:  Hold    If applicable   Type of Anesthesia:  Spinal   Additional requests/questions:    SignedSeymour Bars   05/06/2023, 11:36 AM

## 2023-05-06 NOTE — Telephone Encounter (Signed)
Spoke with patient who is agreeable to do a tele visit on 5/22 at 2:20 pm. Consent give and meds can be reviewed the time of appt.

## 2023-05-11 ENCOUNTER — Ambulatory Visit: Payer: BC Managed Care – PPO | Attending: Cardiovascular Disease | Admitting: Nurse Practitioner

## 2023-05-11 DIAGNOSIS — Z01810 Encounter for preprocedural cardiovascular examination: Secondary | ICD-10-CM

## 2023-05-11 NOTE — Progress Notes (Signed)
Virtual Visit via Telephone Note   Because of Icis Stratis Lumadue's co-morbid illnesses, she is at least at moderate risk for complications without adequate follow up.  This format is felt to be most appropriate for this patient at this time.  The patient did not have access to video technology/had technical difficulties with video requiring transitioning to audio format only (telephone).  All issues noted in this document were discussed and addressed.  No physical exam could be performed with this format.  Please refer to the patient's chart for her consent to telehealth for [facility].  Evaluation Performed:  Preoperative cardiovascular risk assessment _____________   Date:  05/11/2023   Patient ID:  Brandi Farley, DOB 04/26/1961, MRN [id number] Patient Location:  Home Provider location:   Office  Primary Care Provider:  Benita Stabile, MD Primary Cardiologist:  Dina Rich, MD  Chief Complaint / Patient Profile   62 y.o. y/o female with a h/o mild nonobstructive CAD, prediabetes, and arthritis who is pending R total knee arthroplasty on 05/23/2023 Dr. Durene Romans of [facility] and presents today for telephonic preoperative cardiovascular risk assessment.  History of Present Illness    Brandi Farley is a 62 y.o. female who presents via audio/video conferencing for a telehealth visit today.  Pt was last seen in cardiology clinic on 08/31/2022 by Dina Rich.  At that time Brandi Farley was doing well.  She did note an episode of chest pressure.  Coronary CT angiogram revealed coronary calcium score 41.9 (84th percentile), mild nonobstructive CAD.  The patient is now pending procedure as outlined above. Since her last visit, she has done well from a cardiac standpoint.   She denies chest pain, palpitations, dyspnea, pnd, orthopnea, n, v, dizziness, syncope, edema, weight gain, or early satiety. All other systems reviewed and are otherwise negative except as noted  above.   Past Medical History    Past Medical History:  Diagnosis Date   Acid reflux    Anxiety    Arthritis    severe in knees and back and hands   Complication of anesthesia    slow to awaken at times   Depression    Fibroid    Breast   Hearing deficit    Heart murmur    Hormone disorder    Lyme disease spring 2019   N&V (nausea and vomiting) 02/16/2023   Pre-diabetes    Seasonal allergies    Past Surgical History:  Procedure Laterality Date   APPENDECTOMY  age 60   CHOLECYSTECTOMY N/A 02/10/2022   Procedure: LAPAROSCOPIC CHOLECYSTECTOMY;  Surgeon: Lucretia Roers, MD;  Location: [facility];  Service: General;  Laterality: N/A;   COCHLEAR IMPLANT Right 2016 [facility]   COLONOSCOPY WITH PROPOFOL N/A 12/17/2022   Procedure: COLONOSCOPY WITH PROPOFOL;  Surgeon: Lanelle Bal, DO;  [facility];  Service: Endoscopy;  Laterality: N/A;  11:00am, asa 2   DILATATION & CURETTAGE/HYSTEROSCOPY WITH MYOSURE N/A 12/01/2018   Procedure: DILATATION & CURETTAGE/HYSTEROSCOPY WITH MYOSURE: BIOPSY OF LABIAL MAJORA;  Surgeon: Jerene Bears, MD;  Location: [facility];  Service: Gynecology;  Laterality: N/A;   ESOPHAGOGASTRODUODENOSCOPY N/A 02/06/2014   Procedure: ESOPHAGOGASTRODUODENOSCOPY (EGD);  Surgeon: Malissa Hippo, MD;  [facility];  Service: Endoscopy;  Laterality: N/A;  1200   KNEE SURGERY Right 2018   meniscus repair   TUBAL LIGATION  1999    Allergies  Allergies  Allergen Reactions   Atorvastatin Hives, Itching,  Shortness Of Breath and Swelling   Bactrim [Sulfamethoxazole-Trimethoprim] Shortness Of Breath and Other (See Comments)    Cramping   Doryx [Doxycycline] Shortness Of Breath   Keflex [Cephalexin] Shortness Of Breath   Alpha-Gal Hives    Can't do pork or beef or lamb, can handle items   Iodinated Contrast Media Other (See Comments)   Other Hives and Itching    Hida scan/ at injection site raised up and really red. Happen  day after the scan   Pravastatin     Bad reaction to all statins   Wound Dressing Adhesive     Adhesive from the sterile drapes used in surgery.   Sincalide Hives, Itching and Rash    Home Medications    Prior to Admission medications   Medication Sig Start Date End Date Taking? Authorizing Provider  aspirin-acetaminophen-caffeine (EXCEDRIN MIGRAINE) [phone] MG tablet Take 2 tablets by mouth every 6 (six) hours as needed for migraine.    [provider]  cetirizine (ZYRTEC) 10 MG tablet Take 10 mg by mouth at bedtime.    [provider]  diphenhydrAMINE (BENADRYL) 50 MG capsule Take one hour before scheduled CT scan. 02/16/23   Tiffany Kocher, PA-C  EPINEPHrine 0.3 mg/0.3 mL IJ SOAJ injection Inject 0.3 mg into the muscle as needed for anaphylaxis.    [provider]  ezetimibe (ZETIA) 10 MG tablet Take 1 tablet (10 mg total) by mouth daily. 12/09/22 12/04/23  Antoine Poche, MD  hydrocortisone (ANUSOL-HC) 2.5 % rectal cream Place 1 Application rectally daily as needed for hemorrhoids. 07/31/22   [provider]  ibuprofen (ADVIL) 200 MG tablet Take 400 mg by mouth every 6 (six) hours as needed (Joint pain).    [provider]  mometasone (NASONEX) 50 MCG/ACT nasal spray Place 1 spray into the nose daily as needed for allergies. 02/03/22   [provider]  montelukast (SINGULAIR) 10 MG tablet TAKE 1 TABLET BY MOUTH ONCE A DAY AT BEDTIME. 03/11/20   Alfonse Spruce, MD  predniSONE (DELTASONE) 50 MG tablet Take 13 hours before, 7 hours before, and 1 hour before scheduled CT scan. 02/16/23   Tiffany Kocher, PA-C  Propylene Glycol (SYSTANE COMPLETE) 0.6 % SOLN Place 1 drop into both eyes daily.    [provider]  Rimegepant Sulfate (NURTEC) 75 MG TBDP Take 75 mg by mouth daily as needed (migraine).    [provider]  sertraline (ZOLOFT) 25 MG tablet Take 25 mg by mouth at bedtime.    [provider]   Vitamin D, Ergocalciferol, (DRISDOL) 1.25 MG (50000 UNIT) CAPS capsule Take 50,000 Units by mouth once a week. 07/16/20   [provider]    Physical Exam    Vital Signs:  Brandi Farley does not have vital signs available for review today.  Given telephonic nature of communication, physical exam is limited. AAOx3. NAD. Normal affect.  Speech and respirations are unlabored.  Accessory Clinical Findings    None  Assessment & Plan    1.  Preoperative Cardiovascular Risk Assessment:  According to the Revised Cardiac Risk Index (RCRI), her Perioperative Risk of Major Cardiac Event is (%): 0.4. Her Functional Capacity in METs is: 7.34 according to the Duke Activity Status Index (DASI). Therefore, based on ACC/AHA guidelines, patient would be at acceptable risk for the planned procedure without further cardiovascular testing.   The patient was advised that if she develops new symptoms prior to surgery to contact our  A copy of this note will be routed to requesting surgeon.  Time:   Today, I have spent 5 minutes with the patient with telehealth technology discussing medical history, symptoms, and management plan.     Joylene Grapes, NP  05/11/2023, 2:26 PM

## 2023-05-23 HISTORY — PX: REPLACEMENT TOTAL KNEE: SUR1224

## 2023-06-02 ENCOUNTER — Encounter (HOSPITAL_COMMUNITY): Payer: Self-pay | Admitting: *Deleted

## 2023-06-02 ENCOUNTER — Other Ambulatory Visit: Payer: Self-pay

## 2023-06-02 ENCOUNTER — Emergency Department (HOSPITAL_COMMUNITY)
Admission: EM | Admit: 2023-06-02 | Discharge: 2023-06-02 | Disposition: A | Discharge status: Home / Self Care | Payer: BC Managed Care – PPO | Attending: Emergency Medicine | Admitting: Emergency Medicine

## 2023-06-02 DIAGNOSIS — R21 Rash and other nonspecific skin eruption: Secondary | ICD-10-CM | POA: Diagnosis present

## 2023-06-02 DIAGNOSIS — L231 Allergic contact dermatitis due to adhesives: Secondary | ICD-10-CM | POA: Diagnosis not present

## 2023-06-02 DIAGNOSIS — Z7982 Long term (current) use of aspirin: Secondary | ICD-10-CM | POA: Insufficient documentation

## 2023-06-02 MED ORDER — PREDNISONE 50 MG PO TABS
60.0000 mg | ORAL_TABLET | Freq: Once | ORAL | Status: AC
  Administered 2023-06-02: 60 mg via ORAL
  Filled 2023-06-02: qty 1

## 2023-06-02 MED ORDER — DIPHENHYDRAMINE HCL 25 MG PO CAPS
50.0000 mg | ORAL_CAPSULE | Freq: Once | ORAL | Status: AC
  Administered 2023-06-02: 50 mg via ORAL
  Filled 2023-06-02: qty 2

## 2023-06-02 NOTE — ED Triage Notes (Signed)
Pt c/o rash to right knee and face that started yesterday. Pt had a total knee replacement to right side on 05/23/23. Pt talked to the nurse at [facility] yesterday and they instructed her to use Benadryl and Hydrocortisone cream, but she has had no relief.

## 2023-06-02 NOTE — Discharge Instructions (Addendum)
Continue to take Benadryl, you may take Benadryl every 4-6 hours for rash or itching.  Take the antibiotics and steroids that the orthopedic team prescribed to you.  Return to the ER if you develop fever, new or worsening redness, or any other new/concerning symptoms.

## 2023-06-02 NOTE — ED Provider Notes (Signed)
[place] EMERGENCY DEPARTMENT AT [facility] Provider Note   CSN: [id number] Arrival date & time: 06/02/23  1000     History  Chief Complaint  Patient presents with   Rash    LEASA BLAZE is a 62 y.o. female.  HPI 62 year old female presents with right knee redness and swelling.  On 6/3 she had a right knee replacement by Dr. Constance Goltz.  Starting 3 days ago she noticed a little bit of redness around the dressing.  Yesterday it was getting worse and so the [facility] nurse came by and took off the dressing a little earlier than previously scheduled.  However now her knee is swollen and more red and warm.  There is no pain, just a little bit of decreased range of motion due to the swelling.  She has some soreness but no increase in the pain.  No fever/chills.  No shortness of breath or trouble breathing/swallowing.  Noticed a little bit of a rash to her face that seems to be improved.  She is noticing a couple dots of rash going up her thigh.  The area is itchy.  After arriving here she was made aware that the nurse had called in an antibiotic and steroid prescription.  Home Medications Prior to Admission medications   Medication Sig Start Date End Date Taking? Authorizing Provider  aspirin-acetaminophen-caffeine (EXCEDRIN MIGRAINE) [phone] MG tablet Take 2 tablets by mouth every 6 (six) hours as needed for migraine.    [provider]  cetirizine (ZYRTEC) 10 MG tablet Take 10 mg by mouth at bedtime.    [provider]  diphenhydrAMINE (BENADRYL) 50 MG capsule Take one hour before scheduled CT scan. 02/16/23   Tiffany Kocher, PA-C  EPINEPHrine 0.3 mg/0.3 mL IJ SOAJ injection Inject 0.3 mg into the muscle as needed for anaphylaxis.    [provider]  ezetimibe (ZETIA) 10 MG tablet Take 1 tablet (10 mg total) by mouth daily. 12/09/22 12/04/23  Antoine Poche, MD  hydrocortisone (ANUSOL-HC) 2.5 % rectal cream Place 1 Application rectally daily as  needed for hemorrhoids. 07/31/22   [provider]  ibuprofen (ADVIL) 200 MG tablet Take 400 mg by mouth every 6 (six) hours as needed (Joint pain).    [provider]  mometasone (NASONEX) 50 MCG/ACT nasal spray Place 1 spray into the nose daily as needed for allergies. 02/03/22   [provider]  montelukast (SINGULAIR) 10 MG tablet TAKE 1 TABLET BY MOUTH ONCE A DAY AT BEDTIME. 03/11/20   Alfonse Spruce, MD  predniSONE (DELTASONE) 50 MG tablet Take 13 hours before, 7 hours before, and 1 hour before scheduled CT scan. 02/16/23   Tiffany Kocher, PA-C  Propylene Glycol (SYSTANE COMPLETE) 0.6 % SOLN Place 1 drop into both eyes daily.    [provider]  Rimegepant Sulfate (NURTEC) 75 MG TBDP Take 75 mg by mouth daily as needed (migraine).    [provider]  sertraline (ZOLOFT) 25 MG tablet Take 25 mg by mouth at bedtime.    [provider]  Vitamin D, Ergocalciferol, (DRISDOL) 1.25 MG (50000 UNIT) CAPS capsule Take 50,000 Units by mouth once a week. 07/16/20   [provider]      Allergies    Atorvastatin, Bactrim [sulfamethoxazole-trimethoprim], Doryx [doxycycline], Keflex [cephalexin], Alpha-gal, Iodinated contrast media, Other, Pravastatin, Wound dressing adhesive, and Sincalide    Review of Systems   Review of Systems  Constitutional:  Negative for chills and fever.  Skin:  Positive for color change and rash.    Physical Exam Updated Vital Signs BP 137/79 (BP Location: Right Arm)   Pulse 86   Temp 98 F (36.7 C) (Oral)   Resp 18   Ht 5\' 7"  (1.702 m)   Wt 103 kg   LMP 11/10/2018   SpO2 95%   BMI 35.55 kg/m  Physical Exam Vitals and nursing note reviewed.  Constitutional:      Appearance: She is well-developed.  HENT:     Head: Normocephalic and atraumatic.  Cardiovascular:     Rate and Rhythm: Normal rate and regular rhythm.     Pulses:          Dorsalis pedis pulses are 2+ on the right side.  Pulmonary:      Effort: Pulmonary effort is normal.  Musculoskeletal:     Comments: See picture. Right knee is diffusely swollen and red. It is mildly warm but not hot. No purulence from wound. Has intact ROM, mildly limited due to swelling.  Her face also has a couple small erythematous papules similar to her proximal thigh.  Skin:    General: Skin is warm and dry.  Neurological:     Mental Status: She is alert.        ED Results / Procedures / Treatments   Labs (all labs ordered are listed, but only abnormal results are displayed) Labs Reviewed - No data to display  EKG None  Radiology No results found.  Procedures Procedures    Medications Ordered in ED Medications  diphenhydrAMINE (BENADRYL) capsule 50 mg (has no administration in time range)  predniSONE (DELTASONE) tablet 60 mg (has no administration in time range)    ED Course/ Medical Decision Making/ A&P                             Medical Decision Making Amount and/or Complexity of Data Reviewed External Data Reviewed: notes.   This is most likely a reaction to the Aquacel that was placed after knee surgery.  Discussed with Morrie Sheldon, Dr. Nilsa Nutting PA, they have already prescribed prednisone and antibiotics and feel comfortable that this is not an infection.  She has no systemic symptoms.  No signs or symptoms of anaphylaxis, angioedema, or systemic allergic reaction.  Given first dose of prednisone here with some Benadryl.  Will discharge home with return precautions.        Final Clinical Impression(s) / ED Diagnoses Final diagnoses:  Contact dermatitis due to adhesives, unspecified contact dermatitis type    Rx / DC Orders ED Discharge Orders     None         Pricilla Loveless, MD 06/02/23 1203

## 2023-06-13 ENCOUNTER — Encounter: Payer: Self-pay | Admitting: Cardiology

## 2023-06-13 NOTE — Telephone Encounter (Signed)
May have to consider repatha or praluent, can we refer her to lipid clinic please  JBranch MD

## 2023-06-15 ENCOUNTER — Other Ambulatory Visit: Payer: Self-pay

## 2023-06-15 DIAGNOSIS — E7521 Fabry (-Anderson) disease: Secondary | ICD-10-CM

## 2023-09-27 NOTE — Telephone Encounter (Signed)
Pt was scheduled for 09/28/2023 over the phone

## 2023-09-28 ENCOUNTER — Encounter: Payer: Self-pay | Admitting: Gastroenterology

## 2023-09-28 ENCOUNTER — Ambulatory Visit (INDEPENDENT_AMBULATORY_CARE_PROVIDER_SITE_OTHER): Payer: BC Managed Care – PPO | Admitting: Gastroenterology

## 2023-09-28 VITALS — BP 127/84 | HR 74 | Temp 97.5°F | Ht 67.0 in | Wt 218.4 lb

## 2023-09-28 DIAGNOSIS — K5732 Diverticulitis of large intestine without perforation or abscess without bleeding: Secondary | ICD-10-CM | POA: Diagnosis not present

## 2023-09-28 MED ORDER — AMOXICILLIN-POT CLAVULANATE 875-125 MG PO TABS: 1.0000 | ORAL_TABLET | Freq: Two times a day (BID) | ORAL | 0 refills | Status: AC

## 2023-09-28 NOTE — Progress Notes (Signed)
GI Office Note    Referring Provider: Benita Stabile, MD Primary Care Physician:  Benita Stabile, MD  Primary Gastroenterologist: Hennie Duos. Marletta Lor, DO   Chief Complaint   Chief Complaint  Patient presents with   Abdominal Pain    Upper right quadrant pain and gets nauseous and gassy when she eats. Has been going on for the last week to week and a half.     History of Present Illness   Brandi Farley is a 62 y.o. female presenting today for urgent office visit for abdominal pain.  She was last seen in February 2024.  History of alpha gal, diverticulitis, IBS.  For one week, abdominal pain upper abdomen and to the right. More generalized yesterday.  Feels full with eating. Feels like previous episodes of diverticulitis.BMs had been okay before onset of these symptoms. Even with pain medication after knee replacement she was going regularly. Having loose stools daily.  Yesterday had 3-4.  Starts out formed and then goes to loose.  No melena or rectal bleeding.  She has tried fasting, then added fiber over the course the last week to try to help with her symptoms.  She believes her symptoms were triggered by strain away from her high-fiber diet after having knee replacement.  She had family members preparing food for her.  She denies any fever.  Had some chills.  No dysuria.  No heartburn, vomiting.  She has had some nausea.    CT A/P with contrast 01/2023: IMPRESSION: 1. Mild residual wall thickening and adjacent inflammation in the sigmoid colon, improved from previous study, consistent with recurrent or resolving diverticulitis. No evidence of bowel perforation or abscess. 2. No other acute findings or explanation for the patient's symptoms. 3. Stable bilateral adnexal cysts from recent CT. As noted on prior CT, the left-sided lesion which currently measures up to 3.6 cm has mildly enlarged from previous ultrasound of 09/18/2020 at which time it measured 2.6 cm maximally. Slow  growth favors a benign cystic neoplasm. Non emergent pelvic ultrasound follow-up recommended. Note: This recommendation does not apply to premenarchal patients and to those with increased risk (genetic, family history, elevated tumor markers or other high-risk factors) of ovarian cancer. Reference: JACR 2020 Feb; 17(2):248-254 4. Moderate lumbar facet arthropathy with a degenerative grade 1 anterolisthesis at L4-5. 5.  Aortic Atherosclerosis (ICD10-I70.0).    Colonoscopy 11/2022: -nonbleeding internal hemorrhoids -diverticulosis, sigmoid, trv, ascending colon -stool in entire colon, lavage with copious sterile water resulting in clearance with fiar visualization -next colonoscopy 10 years   EGD February 2015: -Small sliding hiatal hernia. -Erosive antral gastritis but no evidence of peptic ulcer disease.  -H. pylori serologies negative.    Medications   Current Outpatient Medications  Medication Sig Dispense Refill   aspirin-acetaminophen-caffeine (EXCEDRIN MIGRAINE) 250-250-65 MG tablet Take 2 tablets by mouth every 6 (six) hours as needed for migraine.     celecoxib (CELEBREX) 200 MG capsule      cetirizine (ZYRTEC) 10 MG tablet Take 10 mg by mouth at bedtime.     EPINEPHrine 0.3 mg/0.3 mL IJ SOAJ injection Inject 0.3 mg into the muscle as needed for anaphylaxis.     hydrocortisone (ANUSOL-HC) 2.5 % rectal cream Place 1 Application rectally daily as needed for hemorrhoids.     ibuprofen (ADVIL) 200 MG tablet Take 400 mg by mouth every 6 (six) hours as needed (Joint pain).     mometasone (NASONEX) 50 MCG/ACT nasal spray Place 1 spray into  the nose daily as needed for allergies.     montelukast (SINGULAIR) 10 MG tablet TAKE 1 TABLET BY MOUTH ONCE A DAY AT BEDTIME. 30 tablet 0   Propylene Glycol (SYSTANE COMPLETE) 0.6 % SOLN Place 1 drop into both eyes daily.     Rimegepant Sulfate (NURTEC) 75 MG TBDP Take 75 mg by mouth daily as needed (migraine).     sertraline (ZOLOFT) 25 MG  tablet Take 25 mg by mouth at bedtime.     Vitamin D, Ergocalciferol, (DRISDOL) 1.25 MG (50000 UNIT) CAPS capsule Take 50,000 Units by mouth once a week.     No current facility-administered medications for this visit.    Allergies   Allergies as of 09/28/2023 - Review Complete 09/28/2023  Allergen Reaction Noted   Atorvastatin Hives, Itching, Shortness Of Breath, and Swelling 10/20/2022   Bactrim [sulfamethoxazole-trimethoprim] Shortness Of Breath and Other (See Comments) 10/05/2013   Doryx [doxycycline] Shortness Of Breath 10/05/2013   Keflex [cephalexin] Shortness Of Breath 10/05/2013   Alpha-gal Hives 05/24/2018   Iodinated contrast media Other (See Comments) 12/14/2022   Other Hives and Itching 02/05/2022   Pravastatin  12/17/2022   Wound dressing adhesive  02/13/2022   Sincalide Hives, Itching, and Rash 01/26/2022       Review of Systems   General: Negative for anorexia, weight loss, fever, chills, fatigue, weakness. ENT: Negative for hoarseness, difficulty swallowing , nasal congestion. CV: Negative for chest pain, angina, palpitations, dyspnea on exertion, peripheral edema.  Respiratory: Negative for dyspnea at rest, dyspnea on exertion, cough, sputum, wheezing.  GI: See history of present illness. GU:  Negative for dysuria, hematuria, urinary incontinence, urinary frequency, nocturnal urination.  Endo: Negative for unusual weight change.     Physical Exam   BP 127/84 (BP Location: Right Arm, Patient Position: Sitting, Cuff Size: Large)   Pulse 74   Temp (!) 97.5 F (36.4 C) (Oral)   Ht 5\' 7"  (1.702 m)   Wt 218 lb 6.4 oz (99.1 kg)   LMP 11/10/2018   SpO2 97%   BMI 34.21 kg/m    General: Well-nourished, well-developed in no acute distress.  Eyes: No icterus. Mouth: Oropharyngeal mucosa moist and pink   Abdomen: Bowel sounds are normal, nontender, nondistended, no hepatosplenomegaly or masses,  no abdominal bruits or hernia , no rebound or guarding.  Rectal:  not performed  Extremities: No lower extremity edema. No clubbing or deformities. Neuro: Alert and oriented x 4   Skin: Warm and dry, no jaundice.   Psych: Alert and cooperative, normal mood and affect.  Labs   April 2024: White blood cell count 7000, hemoglobin 13.5, platelets 322,000, BUN 11, creatinine 0.63, albumin 3.8, total bilirubin 0.4, alkaline phosphatase 84, AST 21, ALT 20, A1c 5.8  Imaging Studies   No results found.  Assessment/Plan:   Abdominal pain, suspected diverticulitis.  She has diverticulosis of the ascending colon, transverse colon and sigmoid colon.  Current symptoms typical of her previous episodes of CT documented diverticulitis.  Will treat empirically.  If no improvement in symptoms, would consider trial of PPI to treat for gastritis/ulcer in the setting of NSAID use. -Low residue diet, mostly liquids for the next couple of days and if no evidence symptoms she will starting antibiotics.  She wanted to try dietary changes first. -Cipro and Flagyl caused her significant amount of diarrhea last time.  She request other options. -Plan for Augmentin twice daily for 10 days.  She has a history of shortness of breath while on  Bactrim, doxycycline, Keflex.  At the time of reaction, she was on several different antibiotics back-back and developed shortness of breath.  No swelling or rash.  She tolerates amoxicillin and has taken several times without any issues. -She will call with her progress report next week or sooner if needed -once this resolves she will start back on high-fiber diet      Leanna Battles. Melvyn Neth, MHS, PA-C [facility]

## 2023-09-28 NOTE — Patient Instructions (Signed)
We are going to treat you for diverticulitis. Switch your diet over to a soft diet/liquids. No fruits/veggies until symptoms resolve. Stick to foods that don't require a lot of chewing, like mashed potatoes, soups, etc.  I have given you Augmentin to take twice daily for 7-10 days. If you are not feeling better in 48 hours or symptoms get worse, please start Augmentin. Let me know if your symptoms don't improve.

## 2023-10-27 ENCOUNTER — Telehealth (HOSPITAL_BASED_OUTPATIENT_CLINIC_OR_DEPARTMENT_OTHER): Payer: Self-pay | Admitting: *Deleted

## 2023-10-27 NOTE — Telephone Encounter (Signed)
Called patient and left a message to call the office back to schedule the appointment .  

## 2023-10-31 ENCOUNTER — Ambulatory Visit (HOSPITAL_BASED_OUTPATIENT_CLINIC_OR_DEPARTMENT_OTHER): Payer: BC Managed Care – PPO | Admitting: Certified Nurse Midwife

## 2023-10-31 ENCOUNTER — Encounter (HOSPITAL_BASED_OUTPATIENT_CLINIC_OR_DEPARTMENT_OTHER): Payer: Self-pay | Admitting: Certified Nurse Midwife

## 2023-10-31 VITALS — BP 136/81 | HR 76 | Ht 67.0 in | Wt 219.6 lb

## 2023-10-31 DIAGNOSIS — N7011 Chronic salpingitis: Secondary | ICD-10-CM

## 2023-10-31 DIAGNOSIS — N83202 Unspecified ovarian cyst, left side: Secondary | ICD-10-CM

## 2023-10-31 DIAGNOSIS — D251 Intramural leiomyoma of uterus: Secondary | ICD-10-CM

## 2023-10-31 NOTE — Progress Notes (Signed)
  GYNECOLOGY  VISIT  CC:   LLQ pain, thinks it could be her ovary, pain seems to have resolved  HPI: 62 y.o. [id number] Married White or Caucasian female here for recent LLQ pain. Patient states the pain felt like it was over her left ovary. It happened approx a week ago. The pain seems to have resolved. She is nervous because she has a friend battling ovarian cancer. Patient does have a history of a 2.9cm simple  left ovarian cyst, right hydrosalpinx and multiple uterine fibroids intramural on [place] 09/18/20. She denies vaginal spotting or bleeding.    Past Medical History:  Diagnosis Date   Acid reflux    Anxiety    Arthritis    severe in knees and back and hands   Complication of anesthesia    slow to awaken at times   Depression    Fibroid    Breast   Hearing deficit    Heart murmur    Hormone disorder    Lyme disease spring 2019   N&V (nausea and vomiting) 02/16/2023   Pre-diabetes    Seasonal allergies     MEDS:   Current Outpatient Medications on File Prior to Visit  Medication Sig Dispense Refill   aspirin-acetaminophen-caffeine (EXCEDRIN MIGRAINE) 250-250-65 MG tablet Take 2 tablets by mouth every 6 (six) hours as needed for migraine.     celecoxib (CELEBREX) 200 MG capsule      cetirizine (ZYRTEC) 10 MG tablet Take 10 mg by mouth at bedtime.     EPINEPHrine 0.3 mg/0.3 mL IJ SOAJ injection Inject 0.3 mg into the muscle as needed for anaphylaxis.     hydrocortisone (ANUSOL-HC) 2.5 % rectal cream Place 1 Application rectally daily as needed for hemorrhoids.     ibuprofen (ADVIL) 200 MG tablet Take 400 mg by mouth every 6 (six) hours as needed (Joint pain).     mometasone (NASONEX) 50 MCG/ACT nasal spray Place 1 spray into the nose daily as needed for allergies.     montelukast (SINGULAIR) 10 MG tablet TAKE 1 TABLET BY MOUTH ONCE A DAY AT BEDTIME. 30 tablet 0   Propylene Glycol (SYSTANE COMPLETE) 0.6 % SOLN Place 1 drop into both eyes daily.     Rimegepant Sulfate (NURTEC) 75 MG  TBDP Take 75 mg by mouth daily as needed (migraine).     sertraline (ZOLOFT) 25 MG tablet Take 25 mg by mouth at bedtime.     Vitamin D, Ergocalciferol, (DRISDOL) 1.25 MG (50000 UNIT) CAPS capsule Take 50,000 Units by mouth once a week.     No current facility-administered medications on file prior to visit.    ALLERGIES: Atorvastatin, Bactrim [sulfamethoxazole-trimethoprim], Doryx [doxycycline], Keflex [cephalexin], Alpha-gal, Iodinated contrast media, Other, Pravastatin, Wound dressing adhesive, and Sincalide   PHYSICAL EXAMINATION:    BP 136/81 (BP Location: Right Arm, Patient Position: Sitting, Cuff Size: Large)   Pulse 76   Ht 5\' 7"  (1.702 m)   Wt 219 lb 9.6 oz (99.6 kg)   LMP 11/10/2018   BMI 34.39 kg/m     General appearance: alert, cooperative and appears stated age  Assessment/Plan: 1. Left ovarian cyst - Hx Left 2.9 cm simple ovarian cyst 2021 - Repeat US ordered for follow-up  2. Intramural leiomyoma of uterus - stable/asymptomatic  3. Hydrosalpinx - stable  Pt will RTO for Pelvic GYN [place] to evaluate uterus/ovaries. Letta Kocher

## 2023-11-23 ENCOUNTER — Ambulatory Visit (HOSPITAL_BASED_OUTPATIENT_CLINIC_OR_DEPARTMENT_OTHER): Payer: BC Managed Care – PPO

## 2023-11-23 ENCOUNTER — Ambulatory Visit (HOSPITAL_BASED_OUTPATIENT_CLINIC_OR_DEPARTMENT_OTHER): Payer: BC Managed Care – PPO | Admitting: Obstetrics & Gynecology

## 2023-11-23 ENCOUNTER — Encounter (HOSPITAL_BASED_OUTPATIENT_CLINIC_OR_DEPARTMENT_OTHER): Payer: Self-pay | Admitting: Certified Nurse Midwife

## 2023-11-23 ENCOUNTER — Other Ambulatory Visit (HOSPITAL_BASED_OUTPATIENT_CLINIC_OR_DEPARTMENT_OTHER): Payer: Self-pay | Admitting: Obstetrics & Gynecology

## 2023-11-23 VITALS — BP 146/85 | HR 81 | Ht 67.0 in | Wt 228.0 lb

## 2023-11-23 DIAGNOSIS — D251 Intramural leiomyoma of uterus: Secondary | ICD-10-CM | POA: Diagnosis not present

## 2023-11-23 DIAGNOSIS — N7011 Chronic salpingitis: Secondary | ICD-10-CM | POA: Diagnosis not present

## 2023-11-23 DIAGNOSIS — N83202 Unspecified ovarian cyst, left side: Secondary | ICD-10-CM | POA: Diagnosis not present

## 2023-11-23 DIAGNOSIS — D271 Benign neoplasm of left ovary: Secondary | ICD-10-CM

## 2023-11-24 LAB — CA 125: Cancer Antigen (CA) 125: 6.5 U/mL (ref 0.0–38.1)

## 2023-11-25 NOTE — Progress Notes (Signed)
GYNECOLOGY  VISIT  CC:   Discuss ultrasound results  HPI: 62 y.o. G81P1001 Married White or Caucasian female here for discussion of ultrasound results.  Ultrasound obtained due to mildly enlarging cystic lesion on left ovary noted on CT earlier this year.  She has had some abdominal/pelvic pain as well but this has resolved.    Today findings are reviewed with patient.  Images reviewed and compared to prior ultrasound which was done in September 2021.  The uterus is 2 fibroids measuring 9 mm 1.5 cm.  These are intramural.  The endometrium is thin at 2.7 mm.  Right ovary does appear atrophic.  There appears to be hydrosalpinx beside the ovary.  It measures 3.1 x 2.0 cm today.  This is stable with prior imaging.  The left ovary is mildly enlarged from prior ultrasound.  It is primarily cystic appearing today and is not vascular.  Measures 3.7 x 4.2 x 2.5 cm.  The volume was 21.3 mL.  This is increased from 8.4 mL with last ultrasound in 2021.  No free fluid noted.  Discussed with the patient the mildly enlarged left ovarian cyst.  There are benign, it has enlarged.  We discussed the pros and cons of surgical removal.  She is leaning towards this.  CA125 is recommended today.   Past Medical History:  Diagnosis Date   Acid reflux    Anxiety    Arthritis    severe in knees and back and hands   Complication of anesthesia    slow to awaken at times   Depression    Fibroid    Breast   Hearing deficit    Heart murmur    Hormone disorder    Lyme disease spring 2019   N&V (nausea and vomiting) 02/16/2023   Pre-diabetes    Seasonal allergies     MEDS:   Current Outpatient Medications on File Prior to Visit  Medication Sig Dispense Refill   aspirin-acetaminophen-caffeine (EXCEDRIN MIGRAINE) 250-250-65 MG tablet Take 2 tablets by mouth every 6 (six) hours as needed for migraine.     celecoxib (CELEBREX) 200 MG capsule      cetirizine (ZYRTEC) 10 MG tablet Take 10 mg by mouth at bedtime.      EPINEPHrine 0.3 mg/0.3 mL IJ SOAJ injection Inject 0.3 mg into the muscle as needed for anaphylaxis.     hydrocortisone (ANUSOL-HC) 2.5 % rectal cream Place 1 Application rectally daily as needed for hemorrhoids.     ibuprofen (ADVIL) 200 MG tablet Take 400 mg by mouth every 6 (six) hours as needed (Joint pain).     mometasone (NASONEX) 50 MCG/ACT nasal spray Place 1 spray into the nose daily as needed for allergies.     montelukast (SINGULAIR) 10 MG tablet TAKE 1 TABLET BY MOUTH ONCE A DAY AT BEDTIME. 30 tablet 0   Propylene Glycol (SYSTANE COMPLETE) 0.6 % SOLN Place 1 drop into both eyes daily.     Rimegepant Sulfate (NURTEC) 75 MG TBDP Take 75 mg by mouth daily as needed (migraine).     sertraline (ZOLOFT) 25 MG tablet Take 25 mg by mouth at bedtime.     Vitamin D, Ergocalciferol, (DRISDOL) 1.25 MG (50000 UNIT) CAPS capsule Take 50,000 Units by mouth once a week.     No current facility-administered medications on file prior to visit.    ALLERGIES: Atorvastatin, Bactrim [sulfamethoxazole-trimethoprim], Doryx [doxycycline], Keflex [cephalexin], Alpha-gal, Iodinated contrast media, Other, Pravastatin, Wound dressing adhesive, and Sincalide  SH: Married, non-smoker  Review of Systems  Constitutional: Negative.   Genitourinary: Negative.     PHYSICAL EXAMINATION:    BP (!) 146/85 (BP Location: Right Arm, Patient Position: Sitting, Cuff Size: Large)   Pulse 81   Ht 5\' 7"  (1.702 m)   Wt 228 lb (103.4 kg)   LMP 11/10/2018   BMI 35.71 kg/m      Physical Exam Constitutional:      Appearance: Normal appearance.  Neurological:     General: No focal deficit present.  Psychiatric:        Mood and Affect: Mood normal.        Behavior: Behavior normal.     Assessment/Plan: 1. Left ovarian cyst -CA125 obtained today.  I suspect will be normal.  Patient does have appointment scheduled for early next year.  She will contemplate options and let me know which was new.  Could follow-up  with ultrasounds, as well.    2. Hydrosalpinx  3. Intramural leiomyoma of uterus  Total time with patient: 22 minutes Documentation time: 4 minutes Total time patient care: 26 minutes

## 2023-12-22 ENCOUNTER — Telehealth: Payer: Self-pay | Admitting: Pharmacy Technician

## 2023-12-22 ENCOUNTER — Ambulatory Visit: Payer: 59 | Attending: Internal Medicine | Admitting: Internal Medicine

## 2023-12-22 ENCOUNTER — Encounter: Payer: Self-pay | Admitting: Internal Medicine

## 2023-12-22 ENCOUNTER — Other Ambulatory Visit (HOSPITAL_COMMUNITY): Payer: Self-pay

## 2023-12-22 VITALS — BP 118/86 | HR 89 | Ht 67.0 in | Wt 223.8 lb

## 2023-12-22 DIAGNOSIS — E785 Hyperlipidemia, unspecified: Secondary | ICD-10-CM

## 2023-12-22 DIAGNOSIS — I700 Atherosclerosis of aorta: Secondary | ICD-10-CM | POA: Diagnosis not present

## 2023-12-22 DIAGNOSIS — I2510 Atherosclerotic heart disease of native coronary artery without angina pectoris: Secondary | ICD-10-CM | POA: Diagnosis not present

## 2023-12-22 MED ORDER — REPATHA SURECLICK 140 MG/ML ~~LOC~~ SOAJ: 1.0000 mL | SUBCUTANEOUS | 3 refills | Status: DC

## 2023-12-22 NOTE — Progress Notes (Signed)
 LIPID CLINIC CONSULT NOTE  Chief Complaint:  Manage dyslipidemia  Primary Care Physician: Shona Norleen PEDLAR, MD  Primary Cardiologist:  Alvan Carrier, MD  HPI:  Brandi Farley is a 63 y.o. female who is being seen today for the evaluation of dyslipidemia at the request of Alvan Carrier FALCON, MD. this a pleasant 63 year old female patient of Dr. Carrier Alvan with a history of mild nonobstructive coronary disease however a calcium  score that was age advanced based on a coronary CT in September 2023.  Her family history is not clearly known since she was adopted but she does report a biologic half brother who had cardiovascular disease of early onset.  In the past she has been on statin therapy but has not tolerated atorvastatin  and pravastatin .  Subsequently she was switched to ezetimibe  and this also caused side effects including arthralgias and myalgias.  She noted when she ran out of his medicine after a few months that her symptoms improved.  Based on that she was referred for evaluation and possible PCSK9 inhibitor therapy.  She also has some concerns about diet and notes that she has had to change her diet because of an alpha gal allergy.  Her most recent lipid profile showed total cholesterol 188, HDL 54, triglycerides 81 and LDL 119 in November 2024.  PMHx:  Past Medical History:  Diagnosis Date   Acid reflux    Anxiety    Arthritis    severe in knees and back and hands   Complication of anesthesia    slow to awaken at times   Depression    Fibroid    Breast   Hearing deficit    Heart murmur    Hormone disorder    Lyme disease spring 2019   N&V (nausea and vomiting) 02/16/2023   Pre-diabetes    Seasonal allergies     Past Surgical History:  Procedure Laterality Date   APPENDECTOMY  age 44   CHOLECYSTECTOMY N/A 02/10/2022   Procedure: LAPAROSCOPIC CHOLECYSTECTOMY;  Surgeon: Kallie Manuelita BROCKS, MD;  Location: [facility];  Service: General;  Laterality: N/A;   COCHLEAR  IMPLANT Right 2016 [facility]   COLONOSCOPY WITH PROPOFOL  N/A 12/17/2022   Procedure: COLONOSCOPY WITH PROPOFOL ;  Surgeon: Cindie Carlin POUR, DO;  [facility];  Service: Endoscopy;  Laterality: N/A;  11:00am, asa 2   DILATATION & CURETTAGE/HYSTEROSCOPY WITH MYOSURE N/A 12/01/2018   Procedure: DILATATION & CURETTAGE/HYSTEROSCOPY WITH MYOSURE: BIOPSY OF LABIAL MAJORA;  Surgeon: Cleotilde Ronal RAMAN, MD;  Location: [facility] [place];  Service: Gynecology;  Laterality: N/A;   ESOPHAGOGASTRODUODENOSCOPY N/A 02/06/2014   Procedure: ESOPHAGOGASTRODUODENOSCOPY (EGD);  Surgeon: Claudis RAYMOND Rivet, MD;  [facility];  Service: Endoscopy;  Laterality: N/A;  1200   KNEE SURGERY Right 2018   meniscus repair   REPLACEMENT TOTAL KNEE Right 05/23/2023   TUBAL LIGATION  1999    FAMHx:  Family History  Adopted: Yes  Problem Relation Age of Onset   Breast cancer Mother        Late 53's-40's, brain tumor   Lupus Maternal Aunt     SOCHx:   reports that she has quit smoking. Her smoking use included cigarettes. She has a 50 pack-year smoking history. She has never been exposed to tobacco smoke. She has never used smokeless tobacco. She reports that she does not drink alcohol and does not use drugs.  ALLERGIES:  Allergies  Allergen Reactions   Atorvastatin  Hives, Itching, Shortness Of Breath and Swelling  Bactrim [Sulfamethoxazole-Trimethoprim] Shortness Of Breath and Other (See Comments)    Cramping   Doryx [Doxycycline] Shortness Of Breath   Keflex [Cephalexin] Shortness Of Breath   Alpha-Gal Hives    Can't do pork or beef or lamb, can handle items   Iodinated Contrast Media Other (See Comments)   Other Hives and Itching    Hida scan/ at injection site raised up and really red. Happen day after the scan   Pravastatin      Bad reaction to all statins   Wound Dressing Adhesive     Adhesive from the sterile drapes used in surgery.   Sincalide  Hives, Itching and Rash     ROS: Pertinent items noted in HPI and remainder of comprehensive ROS otherwise negative.  HOME MEDS: Current Outpatient Medications on File Prior to Visit  Medication Sig Dispense Refill   aspirin -acetaminophen -caffeine (EXCEDRIN MIGRAINE) 250-250-65 MG tablet Take 2 tablets by mouth every 6 (six) hours as needed for migraine.     celecoxib (CELEBREX) 200 MG capsule      cetirizine (ZYRTEC) 10 MG tablet Take 10 mg by mouth at bedtime.     EPINEPHrine  0.3 mg/0.3 mL IJ SOAJ injection Inject 0.3 mg into the muscle as needed for anaphylaxis.     ibuprofen  (ADVIL ) 200 MG tablet Take 400 mg by mouth every 6 (six) hours as needed (Joint pain).     methocarbamol (ROBAXIN) 500 MG tablet Take 500 mg by mouth as needed.     mometasone (NASONEX) 50 MCG/ACT nasal spray Place 1 spray into the nose daily as needed for allergies.     montelukast  (SINGULAIR ) 10 MG tablet TAKE 1 TABLET BY MOUTH ONCE A DAY AT BEDTIME. 30 tablet 0   oxyCODONE  (OXY IR/ROXICODONE ) 5 MG immediate release tablet Take 1 tablet by mouth every 4 (four) hours as needed.     Propylene Glycol (SYSTANE COMPLETE) 0.6 % SOLN Place 1 drop into both eyes daily.     Rimegepant Sulfate (NURTEC) 75 MG TBDP Take 75 mg by mouth daily as needed (migraine).     sertraline (ZOLOFT) 25 MG tablet Take 25 mg by mouth at bedtime.     Vitamin D, Ergocalciferol, (DRISDOL) 1.25 MG (50000 UNIT) CAPS capsule Take 50,000 Units by mouth once a week.     No current facility-administered medications on file prior to visit.    LABS/IMAGING: No results found for this or any previous visit (from the past 48 hours). No results found.  LIPID PANEL: No results found for: CHOL, TRIG, HDL, CHOLHDL, VLDL, LDLCALC, LDLDIRECT  WEIGHTS: Wt Readings from Last 3 Encounters:  12/22/23 223 lb 12.8 oz (101.5 kg)  11/23/23 228 lb (103.4 kg)  10/31/23 219 lb 9.6 oz (99.6 kg)    VITALS: BP 118/86 (BP Location: Left Arm, Patient Position: Sitting,  Cuff Size: Normal)   Pulse 89   Ht 5' 7 (1.702 m)   Wt 223 lb 12.8 oz (101.5 kg)   LMP 11/10/2018   SpO2 97%   BMI 35.05 kg/m   EXAM: Deferred  EKG: Deferred  ASSESSMENT: Mixed dyslipidemia, goal LDL less than 70 Statin intolerant-myalgias/arthralgias Ezetimibe  intolerant Coronary artery disease, mild nonobstructive, CAC score 41.9, 84th percentile (08/2022) Aortic atherosclerosis  PLAN: 1.   Ms. Dlouhy has a mixed dyslipidemia but remains above a target LDL less than 70.  She cannot tolerate statins and recently became intolerant of ezetimibe .  She was referred to evaluate for possible PCSK9 inhibitor therapy.  I think she is a good  candidate for this.  Will pursue prior authorization for Repatha .  I also gave her dietary information to help further with weight loss and dietary changes that may impact her lipids.  I will plan follow-up with repeat lipid NMR and LP(a) in about 3 to 4 months.  Thanks again for the kind referral.  Vinie KYM Maxcy, MD, [facility]    [facility]  Medical Director of the Advanced Lipid Disorders &  Cardiovascular Risk Reduction Clinic Diplomate of the American Board of Clinical Lipidology Attending Cardiologist  Direct Dial: [phone]  Fax: [phone]  Website:  [email or web address] Vinie BROCKS Efraim Vanallen 12/22/2023, 10:59 AM

## 2023-12-22 NOTE — Telephone Encounter (Signed)
-----   Message from Nurse Andriette E sent at 12/22/2023 10:51 AM EST ----- Regarding: please complete PA for Repatha  140 or Praluent 150 Hello   This patient was prescribed PCSK9 by Dr. Mona   Please complete PA for this Either Repatha  140mg /mL OR Praluent 150mg /mL -- whichever is preferred   Statin intolerant x2 Coronary artery calcifications  Thanks

## 2023-12-22 NOTE — Telephone Encounter (Signed)
 Pharmacy Patient Advocate Encounter   Received notification from Physician's Office that prior authorization for repatha  is required/requested.   Insurance verification completed.   The patient is insured through [facility] .   Per test claim: PA required; PA submitted to above mentioned insurance via CoverMyMeds Key/confirmation #/EOC [id number] Status is pending

## 2023-12-22 NOTE — Telephone Encounter (Signed)
 Pharmacy Patient Advocate Encounter  Received notification from [facility] that Prior Authorization for repatha  has been APPROVED from 12/22/23 to 12/20/24. Ran test claim, Copay is $30.00 one month. This test claim was processed through [facility]- copay amounts may vary at other pharmacies due to pharmacy/plan contracts, or as the patient moves through the different stages of their insurance plan.   PA #/Case ID/Reference #: [id number]

## 2023-12-22 NOTE — Telephone Encounter (Signed)
 Update sent to patient in MyChart

## 2023-12-22 NOTE — Telephone Encounter (Signed)
 Rx(s) sent to pharmacy electronically.

## 2023-12-22 NOTE — Patient Instructions (Signed)
 Medication Instructions:  Dr. Mona recommends Repatha  Sureclick 140mg /mL OR Praluent 150mg /mL (PCSK9). This is an injectable cholesterol medication self-administered once every 14 days. This medication will likely need prior approval with your insurance company, which we will work on. If the medication is not approved initially, we may need to do an appeal with your insurance.   Administer medication in area of fatty tissue such as abdomen, outer thigh, back of upper arm - and rotate site with each injection Store medication in refrigerator until ready to administer - allow to sit at room temp for 30 mins - 1 hour prior to injection Dispose of medication in a SHARPS container - your pharmacy should be able to direct you on this and proper disposal   If you need a co-pay card for Repatha : https://www.repatha .com/repatha -cost If you need a co-pay card for Praluent: [email or web address]://[email or web address]  Patient Assistance:    These foundations have funds at various times.   The PAN Foundation: https://www.panfoundation.org/disease-funds/hypercholesterolemia/ -- can sign up for wait list  The [facility] offers assistance to help pay for medication copays.  They will cover copays for all cholesterol lowering meds, including statins, fibrates, omega-3 fish oils like Vascepa, ezetimibe , Repatha , Praluent, Nexletol, Nexlizet.  The cards are usually good for $2,500 or 12 months, whichever comes first. Our fax # is [phone] (you will need this to apply) Go to healthwellfoundation.org Click on "Apply Now" Answer questions as to whom is applying (patient or representative) Your disease fund will be "hypercholesterolemia - Medicare access" They will ask questions about finances and which medications you are taking for cholesterol When you submit, the approval is usually within minutes.  You will need to print the card information from the site You will need to show this information  to your pharmacy, they will bill your Medicare Part D plan first -then bill Health Well --for the copay.   You can also call them at [phone], although the hold times can be quite long.     *If you need a refill on your cardiac medications before your next appointment, please call your pharmacy*   Lab Work: FASTING lab work to check cholesterol in 3-4 months ** complete about one week before next appointment  If you have labs (blood work) drawn today and your tests are completely normal, you will receive your results only by: MyChart Message (if you have MyChart) OR A paper copy in the mail If you have any lab test that is abnormal or we need to change your treatment, we will call you to review the results.   Follow-Up: At [facility], you and your health needs are our priority.  As part of our continuing mission to provide you with exceptional heart care, we have created designated Provider Care Teams.  These Care Teams include your primary Cardiologist (physician) and Advanced Practice Providers (APPs -  Physician Assistants and Nurse Practitioners) who all work together to provide you with the care you need, when you need it.  We recommend signing up for the patient portal called MyChart.  Sign up information is provided on this After Visit Summary.  MyChart is used to connect with patients for Virtual Visits (Telemedicine).  Patients are able to view lab/test results, encounter notes, upcoming appointments, etc.  Non-urgent messages can be sent to your provider as well.   To learn more about what you can do with MyChart, go to [email or web address].    Your next appointment:    3-4 months with Dr. Mona (lipid)

## 2023-12-22 NOTE — Addendum Note (Signed)
 Addended by: Lindell Spar on: 12/22/2023 01:48 PM   Modules accepted: Orders

## 2024-01-09 ENCOUNTER — Other Ambulatory Visit: Payer: Self-pay | Admitting: Pharmacist

## 2024-01-09 MED ORDER — REPATHA SURECLICK 140 MG/ML ~~LOC~~ SOAJ: 1.0000 mL | SUBCUTANEOUS | 3 refills | Status: DC

## 2024-02-09 ENCOUNTER — Ambulatory Visit (HOSPITAL_BASED_OUTPATIENT_CLINIC_OR_DEPARTMENT_OTHER): Payer: BC Managed Care – PPO | Admitting: Obstetrics & Gynecology

## 2024-02-21 ENCOUNTER — Ambulatory Visit: Payer: BC Managed Care – PPO | Admitting: Cardiology

## 2024-04-03 ENCOUNTER — Ambulatory Visit (HOSPITAL_BASED_OUTPATIENT_CLINIC_OR_DEPARTMENT_OTHER): Payer: 59 | Admitting: Obstetrics & Gynecology

## 2024-04-11 LAB — NMR, LIPOPROFILE
Cholesterol, Total: 134 mg/dL (ref 100–199)
HDL Particle Number: 38.3 umol/L (ref >=30.5)
HDL-C: 59 mg/dL (ref >39)
LDL Particle Number: 777 nmol/L (ref <1000)
LDL Size: 21 nm (ref >20.5)
LDL-C (NIH Calc): 60 mg/dL (ref 0–99)
LP-IR Score: 37 (ref <=45)
Small LDL Particle Number: 437 nmol/L (ref <=527)
Triglycerides: 75 mg/dL (ref 0–149)

## 2024-04-11 LAB — LIPOPROTEIN A (LPA): Lipoprotein (a): 205.9 nmol/L — ABNORMAL HIGH (ref <75.0)

## 2024-04-17 ENCOUNTER — Ambulatory Visit: Payer: 59 | Attending: Internal Medicine | Admitting: Internal Medicine

## 2024-04-17 ENCOUNTER — Encounter: Payer: Self-pay | Admitting: Internal Medicine

## 2024-04-17 VITALS — BP 128/80 | HR 80 | Ht 67.0 in | Wt 233.6 lb

## 2024-04-17 DIAGNOSIS — E785 Hyperlipidemia, unspecified: Secondary | ICD-10-CM | POA: Diagnosis not present

## 2024-04-17 NOTE — Progress Notes (Unsigned)
 LIPID CLINIC CONSULT NOTE  Chief Complaint:  Manage dyslipidemia  Primary Care Physician: Omie Bickers, MD  Primary Cardiologist:  Armida Lander, MD  HPI:  Brandi Farley is a 63 y.o. female who is being seen today for the evaluation of dyslipidemia at the request of Omie Bickers, MD. this a pleasant 63 year old female patient of Dr. Armida Lander with a history of mild nonobstructive coronary disease however a calcium  score that was age advanced based on a coronary CT in September 2023.  Her family history is not clearly known since she was adopted but she does report a biologic half brother who had cardiovascular disease of early onset.  In the past she has been on statin therapy but has not tolerated atorvastatin  and pravastatin .  Subsequently she was switched to ezetimibe  and this also caused side effects including arthralgias and myalgias.  She noted when she ran out of his medicine after a few months that her symptoms improved.  Based on that she was referred for evaluation and possible PCSK9 inhibitor therapy.  She also has some concerns about diet and notes that she has had to change her diet because of an alpha gal allergy.  Her most recent lipid profile showed total cholesterol 188, HDL 54, triglycerides 81 and LDL 119 in November 2024.  PMHx:  Past Medical History:  Diagnosis Date   Acid reflux    Anxiety    Arthritis    severe in knees and back and hands   Complication of anesthesia    slow to awaken at times   Depression    Fibroid    Breast   Hearing deficit    Heart murmur    Hormone disorder    Lyme disease spring 2019   N&V (nausea and vomiting) 02/16/2023   Pre-diabetes    Seasonal allergies     Past Surgical History:  Procedure Laterality Date   APPENDECTOMY  age 73   CHOLECYSTECTOMY N/A 02/10/2022   Procedure: LAPAROSCOPIC CHOLECYSTECTOMY;  Surgeon: Awilda Bogus, MD;  Location: [facility];  Service: General;  Laterality: N/A;   COCHLEAR IMPLANT  Right [place]   COLONOSCOPY WITH PROPOFOL  N/A 12/17/2022   Procedure: COLONOSCOPY WITH PROPOFOL ;  Surgeon: Vinetta Greening, DO;  [facility];  Service: Endoscopy;  Laterality: N/A;  11:00am, asa 2   DILATATION & CURETTAGE/HYSTEROSCOPY WITH MYOSURE N/A 12/01/2018   Procedure: DILATATION & CURETTAGE/HYSTEROSCOPY WITH MYOSURE: BIOPSY OF LABIAL MAJORA;  Surgeon: Lillian Rein, MD;  Location: [facility] [place];  Service: Gynecology;  Laterality: N/A;   ESOPHAGOGASTRODUODENOSCOPY N/A 02/06/2014   Procedure: ESOPHAGOGASTRODUODENOSCOPY (EGD);  Surgeon: Ruby Corporal, MD;  [facility];  Service: Endoscopy;  Laterality: N/A;  1200   KNEE SURGERY Right 2018   meniscus repair   REPLACEMENT TOTAL KNEE Right 05/23/2023   TUBAL LIGATION  1999    FAMHx:  Family History  Adopted: Yes  Problem Relation Age of Onset   Breast cancer Mother        Late 34's-40's, brain tumor   Lupus Maternal Aunt     SOCHx:   reports that she has quit smoking. Her smoking use included cigarettes. She has a 50 pack-year smoking history. She has never been exposed to tobacco smoke. She has never used smokeless tobacco. She reports that she does not drink alcohol and does not use drugs.  ALLERGIES:  Allergies  Allergen Reactions   Atorvastatin  Hives, Itching, Shortness Of Breath and Swelling  Bactrim [Sulfamethoxazole-Trimethoprim] Shortness Of Breath and Other (See Comments)    Cramping   Doryx [Doxycycline] Shortness Of Breath   Keflex [Cephalexin] Shortness Of Breath   Alpha-Gal Hives    Can't do pork or beef or lamb, can handle items   Iodinated Contrast Media Other (See Comments)   Other Hives and Itching    Hida scan/ at injection site raised up and really red. Happen day after the scan   Pravastatin      Bad reaction to all statins   Wound Dressing Adhesive     Adhesive from the sterile drapes used in surgery.   Sincalide  Hives, Itching and Rash     ROS: Pertinent items noted in HPI and remainder of comprehensive ROS otherwise negative.  HOME MEDS: Current Outpatient Medications on File Prior to Visit  Medication Sig Dispense Refill   aspirin-acetaminophen -caffeine (EXCEDRIN MIGRAINE) 250-250-65 MG tablet Take 2 tablets by mouth every 6 (six) hours as needed for migraine.     celecoxib (CELEBREX) 200 MG capsule      cetirizine (ZYRTEC) 10 MG tablet Take 10 mg by mouth at bedtime.     EPINEPHrine  0.3 mg/0.3 mL IJ SOAJ injection Inject 0.3 mg into the muscle as needed for anaphylaxis.     Evolocumab  (REPATHA  SURECLICK) 140 MG/ML SOAJ Inject 140 mg into the skin every 14 (fourteen) days. 6 mL 3   ibuprofen  (ADVIL ) 200 MG tablet Take 400 mg by mouth every 6 (six) hours as needed (Joint pain).     methocarbamol (ROBAXIN) 500 MG tablet Take 500 mg by mouth as needed.     mometasone (NASONEX) 50 MCG/ACT nasal spray Place 1 spray into the nose daily as needed for allergies.     montelukast  (SINGULAIR ) 10 MG tablet TAKE 1 TABLET BY MOUTH ONCE A DAY AT BEDTIME. 30 tablet 0   Propylene Glycol (SYSTANE COMPLETE) 0.6 % SOLN Place 1 drop into both eyes daily.     Rimegepant Sulfate (NURTEC) 75 MG TBDP Take 75 mg by mouth daily as needed (migraine).     sertraline (ZOLOFT) 25 MG tablet Take 25 mg by mouth at bedtime.     Vitamin D, Ergocalciferol, (DRISDOL) 1.25 MG (50000 UNIT) CAPS capsule Take 50,000 Units by mouth once a week.     oxyCODONE  (OXY IR/ROXICODONE ) 5 MG immediate release tablet Take 1 tablet by mouth every 4 (four) hours as needed.     No current facility-administered medications on file prior to visit.    LABS/IMAGING: No results found for this or any previous visit (from the past 48 hours). No results found.  LIPID PANEL: No results found for: "CHOL", "TRIG", "HDL", "CHOLHDL", "VLDL", "LDLCALC", "LDLDIRECT"  WEIGHTS: Wt Readings from Last 3 Encounters:  04/17/24 233 lb 9.6 oz (106 kg)  12/22/23 223 lb 12.8 oz (101.5 kg)   11/23/23 228 lb (103.4 kg)    VITALS: BP 128/80 (BP Location: Left Arm, Patient Position: Sitting, Cuff Size: Normal)   Pulse 80   Ht 5\' 7"  (1.702 m)   Wt 233 lb 9.6 oz (106 kg)   LMP 11/10/2018   SpO2 93%   BMI 36.59 kg/m   EXAM: Deferred  EKG: Deferred  ASSESSMENT: Mixed dyslipidemia, goal LDL less than 70 Statin intolerant-myalgias/arthralgias Ezetimibe  intolerant Coronary artery disease, mild nonobstructive, CAC score 41.9, 84th percentile (08/2022) Aortic atherosclerosis  PLAN: 1.   Ms. Barrozo has a mixed dyslipidemia but remains above a target LDL less than 70.  She cannot tolerate statins and recently became  intolerant of ezetimibe .  She was referred to evaluate for possible PCSK9 inhibitor therapy.  I think she is a good candidate for this.  Will pursue prior authorization for Repatha .  I also gave her dietary information to help further with weight loss and dietary changes that may impact her lipids.  I will plan follow-up with repeat lipid NMR and LP(a) in about 3 to 4 months.  Thanks again for the kind referral. mn Hazle Lites, MD, [facility]  [place]  [facility]  Medical Director of the Advanced Lipid Disorders &  Cardiovascular Risk Reduction Clinic Diplomate of the American Board of Clinical Lipidology Attending Cardiologist  Direct Dial: [phone]  Fax: [phone]  Website:  [email or web address] [place] Lisandra Mathisen 04/17/2024, 4:14 PM

## 2024-04-17 NOTE — Patient Instructions (Addendum)
 Medication Instructions:  NO CHANGES  *If you need a refill on your cardiac medications before your next appointment, please call your pharmacy*  Lab Work: FASTING lab work in 1 year   If you have labs (blood work) drawn today and your tests are completely normal, you will receive your results only by: MyChart Message (if you have MyChart) OR A paper copy in the mail If you have any lab test that is abnormal or we need to change your treatment, we will call you to review the results.   Follow-Up: At [facility], you and your health needs are our priority.  As part of our continuing mission to provide you with exceptional heart care, our providers are all part of one team.  This team includes your primary Cardiologist (physician) and Advanced Practice Providers or APPs (Physician Assistants and Nurse Practitioners) who all work together to provide you with the care you need, when you need it.  Your next appointment:    AS NEEDED with Dr. Maximo Spar   Routine appointments with Dr. Amanda Jungling   We recommend signing up for the patient portal called "MyChart".  Sign up information is provided on this After Visit Summary.  MyChart is used to connect with patients for Virtual Visits (Telemedicine).  Patients are able to view lab/test results, encounter notes, upcoming appointments, etc.  Non-urgent messages can be sent to your provider as well.   To learn more about what you can do with MyChart, go to [email or web address].

## 2024-04-24 ENCOUNTER — Ambulatory Visit: Admitting: Cardiology

## 2024-04-24 ENCOUNTER — Ambulatory Visit: Attending: Cardiology | Admitting: Cardiology

## 2024-04-24 ENCOUNTER — Encounter: Payer: Self-pay | Admitting: Cardiology

## 2024-04-24 VITALS — BP 120/75 | HR 56 | Ht 67.0 in | Wt 234.4 lb

## 2024-04-24 DIAGNOSIS — R0602 Shortness of breath: Secondary | ICD-10-CM | POA: Diagnosis not present

## 2024-04-24 DIAGNOSIS — R0609 Other forms of dyspnea: Secondary | ICD-10-CM | POA: Diagnosis not present

## 2024-04-24 DIAGNOSIS — I2510 Atherosclerotic heart disease of native coronary artery without angina pectoris: Secondary | ICD-10-CM | POA: Diagnosis not present

## 2024-04-24 DIAGNOSIS — E782 Mixed hyperlipidemia: Secondary | ICD-10-CM | POA: Diagnosis not present

## 2024-04-24 MED ORDER — ASPIRIN 81 MG PO TBEC: 81.0000 mg | DELAYED_RELEASE_TABLET | Freq: Every day | ORAL | Status: AC

## 2024-04-24 NOTE — Progress Notes (Signed)
 Clinical Summary Brandi Farley is a 63 y.o.female seen today for follow up of the following medical problems.   1. Chest pain CAD risk factors: prediabetes, former smoker x 23 years. Brother with CAD blockage early 67s   -07/2018 nuclear stress: Very small and mild distal anterior wall defect that is reverisble. Possibly differences in breast placement, cannot exclude very mild ischemia. Either finding supports low risk. -06/2018 echo: LVEF 60-65%, no WMAs, normal diastolic fxn.      08/2022 coronary CTA: calcium  score 42, mild CAD  - rare infrequent nonexertional chest pains a ttimes.  - DOE walking up inclines, can occur when walking from the parking lot into Brandi Farley. More SOB in warmer temperatures - some LE edema  at times. No orthopnea.  - no recent chest pains.   2.HLD - intolerant to statins and zetia . Tried atorvastatin , pravastatin .  - joint pains reported on zetia  - referrer do lipid clinic, started on repatha   03/2024 TC 134 HDL 59 TG 75 LDL 60     Past Medical History:  Diagnosis Date   Acid reflux    Anxiety    Arthritis    severe in knees and back and hands   Complication of anesthesia    slow to awaken at times   Depression    Fibroid    Breast   Hearing deficit    Heart murmur    Hormone disorder    Lyme disease spring 2019   N&V (nausea and vomiting) 02/16/2023   Pre-diabetes    Seasonal allergies      Allergies  Allergen Reactions   Atorvastatin  Hives, Itching, Shortness Of Breath and Swelling   Bactrim [Sulfamethoxazole-Trimethoprim] Shortness Of Breath and Other (See Comments)    Cramping   Doryx [Doxycycline] Shortness Of Breath   Keflex [Cephalexin] Shortness Of Breath   Alpha-Gal Hives    Can't do pork or beef or lamb, can handle items   Iodinated Contrast Media Other (See Comments)   Other Hives and Itching    Hida scan/ at injection site raised up and really red. Happen day after the scan   Pravastatin      Bad reaction to all  statins   Wound Dressing Adhesive     Adhesive from the sterile drapes used in surgery.   Sincalide  Hives, Itching and Rash     Current Outpatient Medications  Medication Sig Dispense Refill   aspirin-acetaminophen -caffeine (EXCEDRIN MIGRAINE) 250-250-65 MG tablet Take 2 tablets by mouth every 6 (six) hours as needed for migraine.     celecoxib (CELEBREX) 200 MG capsule      cetirizine (ZYRTEC) 10 MG tablet Take 10 mg by mouth at bedtime.     EPINEPHrine  0.3 mg/0.3 mL IJ SOAJ injection Inject 0.3 mg into the muscle as needed for anaphylaxis.     Evolocumab  (REPATHA  SURECLICK) 140 MG/ML SOAJ Inject 140 mg into the skin every 14 (fourteen) days. 6 mL 3   ibuprofen  (ADVIL ) 200 MG tablet Take 400 mg by mouth every 6 (six) hours as needed (Joint pain).     methocarbamol (ROBAXIN) 500 MG tablet Take 500 mg by mouth as needed.     mometasone (NASONEX) 50 MCG/ACT nasal spray Place 1 spray into the nose daily as needed for allergies.     montelukast  (SINGULAIR ) 10 MG tablet TAKE 1 TABLET BY MOUTH ONCE A DAY AT BEDTIME. 30 tablet 0   Propylene Glycol (SYSTANE COMPLETE) 0.6 % SOLN Place 1 drop into both  eyes daily.     Rimegepant Sulfate (NURTEC) 75 MG TBDP Take 75 mg by mouth daily as needed (migraine).     sertraline (ZOLOFT) 25 MG tablet Take 25 mg by mouth at bedtime.     Vitamin D, Ergocalciferol, (DRISDOL) 1.25 MG (50000 UNIT) CAPS capsule Take 50,000 Units by mouth once a week.     oxyCODONE  (OXY IR/ROXICODONE ) 5 MG immediate release tablet Take 1 tablet by mouth every 4 (four) hours as needed.     No current facility-administered medications for this visit.     Past Surgical History:  Procedure Laterality Date   APPENDECTOMY  age 82   CHOLECYSTECTOMY N/A 02/10/2022   Procedure: LAPAROSCOPIC CHOLECYSTECTOMY;  Surgeon: Awilda Bogus, MD;  Location: [facility];  Service: General;  Laterality: N/A;   COCHLEAR IMPLANT Right 2016 [facility]   COLONOSCOPY WITH PROPOFOL  N/A 12/17/2022    Procedure: COLONOSCOPY WITH PROPOFOL ;  Surgeon: Vinetta Greening, DO;  Location: [facility];  Service: Endoscopy;  Laterality: N/A;  11:00am, asa 2   DILATATION & CURETTAGE/HYSTEROSCOPY WITH MYOSURE N/A 12/01/2018   Procedure: DILATATION & CURETTAGE/HYSTEROSCOPY WITH MYOSURE: BIOPSY OF LABIAL MAJORA;  Surgeon: Lillian Rein, MD;  Location: [facility];  Service: Gynecology;  Laterality: N/A;   ESOPHAGOGASTRODUODENOSCOPY N/A 02/06/2014   Procedure: ESOPHAGOGASTRODUODENOSCOPY (EGD);  Surgeon: Ruby Corporal, MD;  Location: [facility];  Service: Endoscopy;  Laterality: N/A;  1200   KNEE SURGERY Right 2018   meniscus repair   REPLACEMENT TOTAL KNEE Right 05/23/2023   TUBAL LIGATION  1999     Allergies  Allergen Reactions   Atorvastatin  Hives, Itching, Shortness Of Breath and Swelling   Bactrim [Sulfamethoxazole-Trimethoprim] Shortness Of Breath and Other (See Comments)    Cramping   Doryx [Doxycycline] Shortness Of Breath   Keflex [Cephalexin] Shortness Of Breath   Alpha-Gal Hives    Can't do pork or beef or lamb, can handle items   Iodinated Contrast Media Other (See Comments)   Other Hives and Itching    Hida scan/ at injection site raised up and really red. Happen day after the scan   Pravastatin      Bad reaction to all statins   Wound Dressing Adhesive     Adhesive from the sterile drapes used in surgery.   Sincalide  Hives, Itching and Rash      Family History  Adopted: Yes  Problem Relation Age of Onset   Breast cancer Mother        Late 26's-40's, brain tumor   Lupus Maternal Aunt      Social History Brandi Farley reports that she has quit smoking. Her smoking use included cigarettes. She has a 50 pack-year smoking history. She has never been exposed to tobacco smoke. She has never used smokeless tobacco. Brandi Farley reports no history of alcohol use.     Physical Examination Today's Vitals   04/24/24 1131 04/24/24 1159  BP: 138/80 120/75   Pulse: (!) 56   SpO2: 95%   Weight: 234 lb 6.4 oz (106.3 kg)   Height: 5\' 7"  (1.702 m)    Body mass index is 36.71 kg/m.  Gen: resting comfortably, no acute distress HEENT: no scleral icterus, pupils equal round and reactive, no palptable cervical adenopathy,  CV: RRR, no m/rg, no jvd Resp: Clear to auscultation bilaterally GI: abdomen is soft, non-tender, non-distended, normal bowel sounds, no hepatosplenomegaly MSK: extremities are warm, no edema.  Skin: warm, no rash Neuro:  no focal deficits Psych:  appropriate affect   Diagnostic Studies  06/2018 echo Study Conclusions   - Left ventricle: The cavity size was normal. Wall thickness was    normal. Systolic function was normal. The estimated ejection    fraction was in the range of 60% to 65%. Wall motion was normal;    there were no regional wall motion abnormalities. Left    ventricular diastolic function parameters were normal for the    patient&'s age.  - Aortic valve: Mildly calcified annulus. Trileaflet.  - Right atrium: Central venous pressure (est): 3 mm Hg.  - Atrial septum: No defect or patent foramen ovale was identified.  - Tricuspid valve: There was trivial regurgitation.  - Pulmonary arteries: Systolic pressure could not be accurately    estimated.  - Pericardium, extracardiac: There was no pericardial effusion.      07/2018 nuclear stress There was no ST segment deviation noted during stress. This is a low risk study. The left ventricular ejection fraction is hyperdynamic (>65%). Very small and mild distal anterior wall defect that is reverisble. Possibly differences in breast placement, cannot exclude very mild ischemia. Either finding supports low risk.   08/2022 coronary CTA IMPRESSION: 1. Coronary artery calcium  score 41.9 Agatston units. This places the patient in the 84th percentile for age and gender, suggesting high risk for future cardiac events.   2.  Mild nonobstructive  CAD.    Assessment and Plan   1. Chest pain/coronary atherosclerosis -prior coronary CTA with just mild plaque - denies significant chest pains but has had some recent DOE - obtain echo for DOE. If benign consider PFTs - add ASA for coronary atherosclerosis, intolerant to statins and zetia  she is on repatha  - EKG today shows SR, RBBB, no acute ischemic changes  2. HLD - intolerant to statins and zetia  - doing well on repatha , lipids are at goal - continue current meds   F/u 6 monhts     Laurann Pollock, M.D.

## 2024-04-24 NOTE — Patient Instructions (Signed)
 Medication Instructions:   Begin Aspirin 81mg  daily  Continue all other medications.     Labwork:  none  Testing/Procedures:  Your physician has requested that you have an echocardiogram. Echocardiography is a painless test that uses sound waves to create images of your heart. It provides your doctor with information about the size and shape of your heart and how well your heart's chambers and valves are working. This procedure takes approximately one hour. There are no restrictions for this procedure. Please do NOT wear cologne, perfume, aftershave, or lotions (deodorant is allowed). Please arrive 15 minutes prior to your appointment time.  Please note: We ask at that you not bring children with you during ultrasound (echo/ vascular) testing. Due to room size and safety concerns, children are not allowed in the ultrasound rooms during exams. Our front office staff cannot provide observation of children in our lobby area while testing is being conducted. An adult accompanying a patient to their appointment will only be allowed in the ultrasound room at the discretion of the ultrasound technician under special circumstances. We apologize for any inconvenience. Office will contact with results via phone, letter or mychart.     Follow-Up:  6 months   Any Other Special Instructions Will Be Listed Below (If Applicable).   If you need a refill on your cardiac medications before your next appointment, please call your pharmacy.

## 2024-05-09 ENCOUNTER — Ambulatory Visit: Attending: Cardiology

## 2024-05-09 DIAGNOSIS — R0602 Shortness of breath: Secondary | ICD-10-CM

## 2024-05-10 ENCOUNTER — Other Ambulatory Visit (HOSPITAL_COMMUNITY): Payer: Self-pay | Admitting: Obstetrics & Gynecology

## 2024-05-10 DIAGNOSIS — Z1231 Encounter for screening mammogram for malignant neoplasm of breast: Secondary | ICD-10-CM

## 2024-05-10 LAB — ECHOCARDIOGRAM COMPLETE
AR max vel: 3.1 cm2
AV Area VTI: 2.48 cm2
AV Area mean vel: 3.04 cm2
AV Mean grad: 4 mmHg
AV Peak grad: 7.7 mmHg
Ao pk vel: 1.39 m/s
Area-P 1/2: 3.65 cm2
MV VTI: 1.92 cm2
S' Lateral: 3.2 cm
Single Plane A4C EF: 60 %

## 2024-05-17 ENCOUNTER — Encounter (HOSPITAL_COMMUNITY): Payer: Self-pay

## 2024-05-17 ENCOUNTER — Ambulatory Visit (HOSPITAL_COMMUNITY)
Admission: RE | Admit: 2024-05-17 | Discharge: 2024-05-17 | Disposition: A | Discharge status: Home / Self Care | Source: Ambulatory Visit | Attending: Obstetrics & Gynecology | Admitting: Obstetrics & Gynecology

## 2024-05-17 DIAGNOSIS — Z1231 Encounter for screening mammogram for malignant neoplasm of breast: Secondary | ICD-10-CM | POA: Diagnosis present

## 2024-05-24 ENCOUNTER — Encounter (HOSPITAL_BASED_OUTPATIENT_CLINIC_OR_DEPARTMENT_OTHER): Payer: Self-pay | Admitting: Obstetrics & Gynecology

## 2024-05-24 ENCOUNTER — Ambulatory Visit (INDEPENDENT_AMBULATORY_CARE_PROVIDER_SITE_OTHER): Payer: 59 | Admitting: Obstetrics & Gynecology

## 2024-05-24 VITALS — BP 141/86 | HR 55 | Ht 67.0 in | Wt 230.0 lb

## 2024-05-24 DIAGNOSIS — N6081 Other benign mammary dysplasias of right breast: Secondary | ICD-10-CM

## 2024-05-24 DIAGNOSIS — L509 Urticaria, unspecified: Secondary | ICD-10-CM

## 2024-05-24 DIAGNOSIS — Z01411 Encounter for gynecological examination (general) (routine) with abnormal findings: Secondary | ICD-10-CM

## 2024-05-24 DIAGNOSIS — G43709 Chronic migraine without aura, not intractable, without status migrainosus: Secondary | ICD-10-CM

## 2024-05-24 DIAGNOSIS — N7011 Chronic salpingitis: Secondary | ICD-10-CM

## 2024-05-24 DIAGNOSIS — Z01419 Encounter for gynecological examination (general) (routine) without abnormal findings: Secondary | ICD-10-CM

## 2024-05-24 DIAGNOSIS — D251 Intramural leiomyoma of uterus: Secondary | ICD-10-CM

## 2024-05-24 MED ORDER — PREDNISONE 10 MG (21) PO TBPK: ORAL_TABLET | ORAL | 0 refills | Status: DC

## 2024-05-24 NOTE — Progress Notes (Addendum)
 ANNUAL EXAM Patient name: ZENOBIA KUENNEN MRN [id number]  Date of birth: [birth year] Chief Complaint:   AEX  History of Present Illness:   Brandi Farley is a 63 y.o. G90P1001 Caucasian female being seen today for a routine annual exam.  H/o hydrosalpinx on right adnexa.  Left ovary has cystic lesion.    Has used zyrtec for years and decided to stop it five days ago.  Is really wanting to be off this.  Has hives now.  Is nervous about going into the weekend.  Has used prednisone  taper in the past.  Would like this now if possible.  Rx will be sent to pharmacy on file.  If symptoms return after stopping, she will reach out to her PCP.    Denies vaginal bleeding.     Patient's last menstrual period was 11/10/2018.    Last pap  01/27/2022. Results were: NILM w/ HRHPV negative. H/O abnormal pap: no Last mammogram: 05/17/2024 . Results were: normal. Family h/o breast cancer: yes mother Last colonoscopy: 12/17/2022 . Results were: normal. Family h/o colorectal cancer: no     05/24/2024    3:27 PM 11/23/2023    2:59 PM 10/31/2023    9:07 AM 01/27/2022    2:51 PM  Depression screen PHQ 2/9  Decreased Interest 0 0 0 0  Down, Depressed, Hopeless 0 0 0 0  PHQ - 2 Score 0 0 0 0      Review of Systems:   Pertinent items are noted in HPI Denies any pelvic pain, bowel or bladder changes Pertinent History Reviewed:  Reviewed past medical,surgical, social and family history.  Reviewed problem list, medications and allergies. Physical Assessment:   Vitals:   05/24/24 1524  BP: (!) 141/86  Pulse: (!) 55  Weight: 230 lb (104.3 kg)  Height: 5' 7 (1.702 m)  Body mass index is 36.02 kg/m.        Physical Examination:   General appearance - well appearing, and in no distress  Mental status - alert, oriented to person, place, and time  Psych:  She has a normal mood and affect  Skin - warm and dry, normal color, no suspicious lesions noted  Chest - effort normal, all lung fields clear to  auscultation bilaterally  Heart - normal rate and regular rhythm  Neck:  midline trachea, no thyromegaly or nodules  Breasts - right breast with cystic lesion about 5cm, fluctuance center, no suspicious masses, no skin or nipple changes or axillary nodes  Abdomen - soft, nontender, nondistended, no masses or organomegaly  Pelvic - VULVA: normal appearing vulva with no masses, tenderness or lesions   VAGINA: normal appearing vagina with normal color and discharge, no lesions   CERVIX: normal appearing cervix without discharge or lesions, no CMT  Thin prep pap is not done   UTERUS: uterus is felt to be normal size, shape, consistency and nontender   ADNEXA: No adnexal masses or tenderness noted.  Rectal - normal rectal, good sphincter tone, no masses felt.   Extremities:  No swelling or varicosities noted  Chaperone present for exam  Assessment & Plan:  1. Well woman exam with routine gynecological exam (Primary) - Pap smear 2023.  Due next year. - Mammogram 05/17/2024 - Colonoscopy 12/17/2022 - Bone mineral density discussed and will order next year - lab work done with PCP, Dr. Del Favia - vaccines reviewed/updated  2. Hydrosalpinx - she will return in December for repeat u/s - US  PELVIS TRANSVAGINAL NON-OB (TV ONLY);  Future  3. Hives - prednisone  taper to pharmacy.  If doesn't resolve symptoms, she knows she will need follow up with PCP  4. Intramural leiomyoma of uterus  5. Chronic migraine without aura without status migrainosus, not intractable - stable   6. Sebaceous cyst of breast, right - Ambulatory referral to General Surgery   Orders Placed This Encounter  Procedures   US  PELVIS TRANSVAGINAL NON-OB (TV ONLY)   Ambulatory referral to General Surgery    Meds:  Meds ordered this encounter  Medications   predniSONE  (STERAPRED UNI-PAK 21 TAB) 10 MG (21) TBPK tablet    Sig: Take as directed on package    Dispense:  21 tablet    Refill:  0    Follow-up: Return in  about 1 year (around 05/24/2025).  Lillian Rein, MD 05/24/2024 4:48 PM

## 2024-05-31 ENCOUNTER — Telehealth (HOSPITAL_BASED_OUTPATIENT_CLINIC_OR_DEPARTMENT_OTHER): Payer: Self-pay | Admitting: *Deleted

## 2024-05-31 NOTE — Telephone Encounter (Signed)
LMOVM for pt to call office to schedule appt.

## 2024-05-31 NOTE — Telephone Encounter (Signed)
-----   Message from Lillian Rein sent at 05/24/2024  4:49 PM EDT ----- Can you call this pt.  She needs u/s in December to recheck hydrosalpinx.

## 2024-06-02 ENCOUNTER — Encounter (HOSPITAL_BASED_OUTPATIENT_CLINIC_OR_DEPARTMENT_OTHER): Payer: Self-pay | Admitting: Obstetrics & Gynecology

## 2024-07-09 ENCOUNTER — Ambulatory Visit: Payer: Self-pay | Admitting: Cardiology

## 2024-07-09 ENCOUNTER — Encounter: Payer: Self-pay | Admitting: *Deleted

## 2024-10-03 ENCOUNTER — Encounter (INDEPENDENT_AMBULATORY_CARE_PROVIDER_SITE_OTHER): Payer: Self-pay | Admitting: Gastroenterology

## 2024-10-15 ENCOUNTER — Ambulatory Visit: Admitting: Gastroenterology

## 2024-10-15 VITALS — BP 111/74 | HR 62 | Temp 98.6°F | Ht 67.0 in | Wt 229.0 lb

## 2024-10-15 DIAGNOSIS — K5900 Constipation, unspecified: Secondary | ICD-10-CM | POA: Diagnosis not present

## 2024-10-15 NOTE — Progress Notes (Signed)
 GI Office Note    Referring Provider: Shona Norleen PEDLAR, MD Primary Care Physician:  Shona Norleen PEDLAR, MD  Primary Gastroenterologist: Carlin POUR. Cindie, DO   Chief Complaint   Chief Complaint  Patient presents with   Follow-up    Lower abd pain with gas and pressure x a week. Pt had some nausea today but it has cleared up. Pt states it was upper abd pain earlier this week. Pt states that passing gas helps relieves the pressure    History of Present Illness   Brandi Farley is a 63 y.o. female presenting today for same day appt. Last seen 09/2023. H/o alpha gal, diverticulitis, IBS.    Discussed the use of AI scribe software for clinical note transcription with the patient, who gave verbal consent to proceed.  History of Present Illness   Brandi Farley is a 63 year old female with diverticulitis and IBS who presents with nausea and abdominal pain.  She has a history of diverticulitis and IBS. Last week, she experienced discomfort and initially attributed it to IBS due to stress at work. However, she later suspected the pain was related to diverticulitis. Pain starts out in the RUQ like previous episodes of diverticulitis. She started a low fiber diet, actually consuming mostly clears, but by Saturday, she experienced pain under her ribs, constipation, and difficulty passing gas. She took Gas-X on Sunday, which provided some relief. She experienced nausea after breakfast this morning, which persisted throughout the morning. Some relief was found after eating a sandwich, chips, and a cookie at lunch.  Her current medications include gabapentin, Prilosec, and Celebrex 100 mg once daily. She previously used Nexium but switched to Prilosec due to ineffectiveness. She has stopped taking ibuprofen , Robaxin, Zyrtec, and Excedrin Migraine. She experienced severe acid reflux and vomiting after taking Aleve several months ago, which resolved after discontinuing the medication.  She describes  her normal bowel pattern as having one to two small bowel movements per day, which has been stable for the past year. However, in the last couple of weeks, she has experienced constipation and a sensation of blockage. Her last bowel movement was solid and occurred at 2:30 PM today. No blood in stool or fever.     Prior Data   Colonoscopy 11/2022: -nonbleeding internal hemorrhoids -diverticulosis, sigmoid, trv, ascending colon -stool in entire colon, lavage with copious sterile water  resulting in clearance with fiar visualization -next colonoscopy 10 years   EGD February 2015: -Small sliding hiatal hernia. -Erosive antral gastritis but no evidence of peptic ulcer disease.  -H. pylori serologies negative.   Medications   Current Outpatient Medications  Medication Sig Dispense Refill   aspirin  EC 81 MG tablet Take 1 tablet (81 mg total) by mouth daily. Swallow whole.     celecoxib (CELEBREX) 200 MG capsule      EPINEPHrine  0.3 mg/0.3 mL IJ SOAJ injection Inject 0.3 mg into the muscle as needed for anaphylaxis.     Evolocumab  (REPATHA  SURECLICK) 140 MG/ML SOAJ Inject 140 mg into the skin every 14 (fourteen) days. 6 mL 3   gabapentin (NEURONTIN) 100 MG capsule Take 100 mg by mouth daily.     mometasone (NASONEX) 50 MCG/ACT nasal spray Place 1 spray into the nose daily as needed for allergies.     montelukast  (SINGULAIR ) 10 MG tablet TAKE 1 TABLET BY MOUTH ONCE A DAY AT BEDTIME. 30 tablet 0   omeprazole (PRILOSEC OTC) 20 MG tablet Take 20 mg by  mouth daily.     Rimegepant Sulfate (NURTEC) 75 MG TBDP Take 75 mg by mouth daily as needed (migraine).     sertraline (ZOLOFT) 25 MG tablet Take 25 mg by mouth at bedtime.     Vitamin D, Ergocalciferol, (DRISDOL) 1.25 MG (50000 UNIT) CAPS capsule Take 50,000 Units by mouth once a week.     No current facility-administered medications for this visit.    Allergies   Allergies as of 10/15/2024 - Review Complete 05/24/2024  Allergen Reaction  Noted   Atorvastatin  Hives, Itching, Shortness Of Breath, and Swelling 10/20/2022   Bactrim [sulfamethoxazole-trimethoprim] Shortness Of Breath and Other (See Comments) 10/05/2013   Doryx [doxycycline] Shortness Of Breath 10/05/2013   Keflex [cephalexin] Shortness Of Breath 10/05/2013   Alpha-gal Hives 05/24/2018   Iodinated contrast media Other (See Comments) 12/14/2022   Other Hives and Itching 02/05/2022   Pravastatin   12/17/2022   Wound dressing adhesive  02/13/2022   Sincalide  Hives, Itching, and Rash 01/26/2022     Review of Systems   General: Negative for anorexia, weight loss, fever, chills, fatigue, weakness. ENT: Negative for hoarseness, difficulty swallowing , nasal congestion. Cochlear implant CV: Negative for chest pain, angina, palpitations, dyspnea on exertion, peripheral edema.  Respiratory: Negative for dyspnea at rest, dyspnea on exertion, cough, sputum, wheezing.  GI: See history of present illness. GU:  Negative for dysuria, hematuria, urinary incontinence, urinary frequency, nocturnal urination.  Endo: Negative for unusual weight change.     Physical Exam   BP 111/74   Pulse 62   Temp 98.6 F (37 C)   Ht 5' 7 (1.702 m)   Wt 229 lb (103.9 kg)   LMP 11/10/2018   BMI 35.87 kg/m    General: Well-nourished, well-developed in no acute distress.  Eyes: No icterus. Mouth: Oropharyngeal mucosa moist and pink   Lungs: Clear to auscultation bilaterally.  Heart: Regular rate and rhythm, no murmurs rubs or gallops.  Abdomen: Bowel sounds are normal, nontender, nondistended, no hepatosplenomegaly or masses,  no abdominal bruits or hernia , no rebound or guarding.  Rectal: not performed Extremities: No lower extremity edema. No clubbing or deformities. Neuro: Alert and oriented x 4   Skin: Warm and dry, no jaundice.   Psych: Alert and cooperative, normal mood and affect.  Labs     Tried [facility] system and system was down. Latest scanned labs 04/2024: CMET, CBC,  A1C all normal Imaging Studies   No results found.  Assessment/Plan:    Abdominal pain: Possible diverticulitis with associated bloating,  constipation. Symptoms improving and abdominal exam benign today. Add bowel regimen for now.  Continue low fiber foods.  - Advise use of Miralax, two doses tonight and continue until looser stools are achieved. - If symptoms do not improve by Wednesday, consider ordering a CT scan to rule out diverticulitis.    Sonny RAMAN. Ezzard, MHS, PA-C [facility]

## 2024-10-15 NOTE — Patient Instructions (Signed)
 Try adding two packets of miralax to 8-12 ounces of water  daily until you have soft/looser stool, then cut back to one packet daily.  If your bloating/gas does not improve OR if your symptoms worsen, then we will need to consider next steps such as CT scan.   You can add back soft foods, but would continue low fiber diet for now.

## 2024-10-20 ENCOUNTER — Encounter: Payer: Self-pay | Admitting: Gastroenterology

## 2024-10-20 DIAGNOSIS — K5900 Constipation, unspecified: Secondary | ICD-10-CM | POA: Insufficient documentation

## 2024-10-31 ENCOUNTER — Encounter: Payer: Self-pay | Admitting: Internal Medicine

## 2024-11-21 ENCOUNTER — Encounter (HOSPITAL_BASED_OUTPATIENT_CLINIC_OR_DEPARTMENT_OTHER): Payer: Self-pay | Admitting: Obstetrics & Gynecology

## 2024-11-21 ENCOUNTER — Ambulatory Visit (INDEPENDENT_AMBULATORY_CARE_PROVIDER_SITE_OTHER)

## 2024-11-21 ENCOUNTER — Ambulatory Visit (HOSPITAL_BASED_OUTPATIENT_CLINIC_OR_DEPARTMENT_OTHER): Admitting: Obstetrics & Gynecology

## 2024-11-21 VITALS — BP 128/81 | HR 77 | Wt 227.8 lb

## 2024-11-21 DIAGNOSIS — N83202 Unspecified ovarian cyst, left side: Secondary | ICD-10-CM | POA: Diagnosis not present

## 2024-11-21 DIAGNOSIS — D251 Intramural leiomyoma of uterus: Secondary | ICD-10-CM

## 2024-11-21 DIAGNOSIS — N83201 Unspecified ovarian cyst, right side: Secondary | ICD-10-CM | POA: Diagnosis not present

## 2024-11-21 DIAGNOSIS — N7011 Chronic salpingitis: Secondary | ICD-10-CM

## 2024-11-21 DIAGNOSIS — D271 Benign neoplasm of left ovary: Secondary | ICD-10-CM

## 2024-11-21 NOTE — Progress Notes (Unsigned)
   Ultrasound f/u Patient name: Brandi Farley MRN [id number]  Date of birth: 13-Jan-1961 Chief Complaint:   Follow-up  History of Present Illness:   Brandi Farley is a 63 y.o. G25P1001 Caucasian female being seen today for discussion of ultrasound findings.  H/o right hydrosalpinx and left ovarian cyst so here for repeat imaging.  Today on ultrasound, uterus measures 7.2 x 4.8 x 2.7 cm with a 9 mm anterior intramural fibroid and a 1.3 cm posterior intramural fibroid.  Endometrium is 3 mm.  It is thin and symmetric.  Right ovary not well-visualized but the tubular structure cyst is still present and measures 3 point 0.4 x 1.3 cm.  The left ovary contains a 3.6 x 3.0 cm simple ovarian cyst.  Both these findings have been present as far back as 2019 however the cyst has increased in size from around 2.7 cm to the 3.6 cm noted today.  We discussed this is consistent with a benign process.  She did have an surgical excision in the past.  For the time being I feel it is reasonable to consider stopping ultrasounds but she would prefer to at least continue once yearly.  She does still contemplate surgery as well but is not interested in this right now.  Patient's last menstrual period was 11/10/2018.   Last pap 01/27/2022. Results were: Negative with negative high-risk HPV.   Review of Systems:   Pertinent items are noted in HPI Pertinent History Reviewed:  Reviewed past medical,surgical, social and family history.  Reviewed problem list, medications and allergies. Physical Assessment:   Vitals:   11/21/24 0959  BP: 128/81  Pulse: 77  SpO2: 98%  Weight: 227 lb 12.8 oz (103.3 kg)  Body mass index is 35.68 kg/m.        Physical Examination:   General appearance - well appearing, and in no distress  Mental status - alert, oriented to person, place, and time  Psych:  She has a normal mood and affect  Assessment & Plan:  1. Ovarian benign neoplasm, left (Primary) -She will return in about 6  months for annual exam.  This is not scheduled right now and she does not want to schedule at this time.  She will call. -Today we will recheck CA125. Consider repeat ultrasound in 6 to 12 months. - CA 125  2. Left ovarian cyst    Orders Placed This Encounter  Procedures   CA 125    Time with patient, 18 minutes, additional time for documentation 5 minutes.  Total time 23 minutes.  Ronal GORMAN Pinal, MD 11/22/2024 10:43 AM GYNECOLOGY  VISIT

## 2024-11-22 ENCOUNTER — Ambulatory Visit (HOSPITAL_BASED_OUTPATIENT_CLINIC_OR_DEPARTMENT_OTHER): Payer: Self-pay | Admitting: Obstetrics & Gynecology

## 2024-11-22 LAB — CA 125: Cancer Antigen (CA) 125: 7.5 U/mL (ref 0.0–38.1)

## 2024-11-23 ENCOUNTER — Other Ambulatory Visit: Payer: Self-pay | Admitting: Internal Medicine

## 2024-11-30 ENCOUNTER — Encounter: Payer: Self-pay | Admitting: Cardiology

## 2024-12-05 ENCOUNTER — Telehealth: Payer: Self-pay

## 2024-12-05 ENCOUNTER — Other Ambulatory Visit (HOSPITAL_COMMUNITY): Payer: Self-pay

## 2024-12-05 NOTE — Telephone Encounter (Signed)
 Pharmacy Patient Advocate Encounter   Received notification from Fax that prior authorization for REPATHA  is required/requested.   Insurance verification completed.   The patient is insured through [employer] [facility] .   Per test claim: PA required; PA submitted to above mentioned insurance via Fax Key/confirmation #/[id number] Status is pending

## 2024-12-10 NOTE — Telephone Encounter (Signed)
 Received form with provider signature via fax, faxing to plan.

## 2024-12-18 ENCOUNTER — Other Ambulatory Visit (HOSPITAL_COMMUNITY): Payer: Self-pay

## 2024-12-18 NOTE — Telephone Encounter (Signed)
 Pharmacy Patient Advocate Encounter  Received notification from [employer] [facility] that Prior Authorization for REPATHA  has been APPROVED from 12/10/24 to 12/10/25

## 2025-01-18 ENCOUNTER — Encounter (HOSPITAL_COMMUNITY): Payer: Self-pay

## 2025-01-18 ENCOUNTER — Emergency Department (HOSPITAL_COMMUNITY)
Admission: EM | Admit: 2025-01-18 | Discharge: 2025-01-18 | Disposition: A | Discharge status: Home / Self Care | Attending: Emergency Medicine | Admitting: Emergency Medicine

## 2025-01-18 ENCOUNTER — Other Ambulatory Visit: Payer: Self-pay

## 2025-01-18 ENCOUNTER — Emergency Department (HOSPITAL_COMMUNITY)

## 2025-01-18 DIAGNOSIS — Z7982 Long term (current) use of aspirin: Secondary | ICD-10-CM | POA: Insufficient documentation

## 2025-01-18 DIAGNOSIS — K5792 Diverticulitis of intestine, part unspecified, without perforation or abscess without bleeding: Secondary | ICD-10-CM

## 2025-01-18 DIAGNOSIS — R1032 Left lower quadrant pain: Secondary | ICD-10-CM | POA: Diagnosis present

## 2025-01-18 DIAGNOSIS — Z7984 Long term (current) use of oral hypoglycemic drugs: Secondary | ICD-10-CM | POA: Insufficient documentation

## 2025-01-18 DIAGNOSIS — E119 Type 2 diabetes mellitus without complications: Secondary | ICD-10-CM | POA: Insufficient documentation

## 2025-01-18 DIAGNOSIS — K5732 Diverticulitis of large intestine without perforation or abscess without bleeding: Secondary | ICD-10-CM | POA: Insufficient documentation

## 2025-01-18 LAB — CBC
HCT: 41.6 % (ref 36.0–46.0)
Hemoglobin: 13.8 g/dL (ref 12.0–15.0)
MCH: 28.1 pg (ref 26.0–34.0)
MCHC: 33.2 g/dL (ref 30.0–36.0)
MCV: 84.7 fL (ref 80.0–100.0)
Platelets: 342 10*3/uL (ref 150–400)
RBC: 4.91 MIL/uL (ref 3.87–5.11)
RDW: 14.2 % (ref 11.5–15.5)
WBC: 13.1 10*3/uL — ABNORMAL HIGH (ref 4.0–10.5)
nRBC: 0 % (ref 0.0–0.2)

## 2025-01-18 LAB — COMPREHENSIVE METABOLIC PANEL WITH GFR
ALT: 25 U/L (ref 0–44)
AST: 37 U/L (ref 15–41)
Albumin: 4.3 g/dL (ref 3.5–5.0)
Alkaline Phosphatase: 98 U/L (ref 38–126)
Anion gap: 14 (ref 5–15)
BUN: 7 mg/dL — ABNORMAL LOW (ref 8–23)
CO2: 23 mmol/L (ref 22–32)
Calcium: 9 mg/dL (ref 8.9–10.3)
Chloride: 104 mmol/L (ref 98–111)
Creatinine, Ser: 0.57 mg/dL (ref 0.44–1.00)
GFR, Estimated: >60 mL/min
Glucose, Bld: 103 mg/dL — ABNORMAL HIGH (ref 70–99)
Potassium: 3.9 mmol/L (ref 3.5–5.1)
Sodium: 141 mmol/L (ref 135–145)
Total Bilirubin: 0.7 mg/dL (ref 0.0–1.2)
Total Protein: 7.3 g/dL (ref 6.5–8.1)

## 2025-01-18 LAB — URINALYSIS, ROUTINE W REFLEX MICROSCOPIC
Bilirubin Urine: NEGATIVE
Glucose, UA: NEGATIVE mg/dL
Hgb urine dipstick: NEGATIVE
Ketones, ur: 5 mg/dL — AB
Leukocytes,Ua: NEGATIVE
Nitrite: NEGATIVE
Protein, ur: NEGATIVE mg/dL
Specific Gravity, Urine: 1.017 (ref 1.005–1.030)
pH: 6 (ref 5.0–8.0)

## 2025-01-18 LAB — LIPASE, BLOOD: Lipase: 29 U/L (ref 11–51)

## 2025-01-18 MED ORDER — MORPHINE SULFATE (PF) 4 MG/ML IV SOLN
4.0000 mg | Freq: Once | INTRAVENOUS | Status: AC
  Administered 2025-01-18: 4 mg via INTRAVENOUS
  Filled 2025-01-18: qty 1

## 2025-01-18 MED ORDER — AMOXICILLIN-POT CLAVULANATE 875-125 MG PO TABS: 1.0000 | ORAL_TABLET | Freq: Once | ORAL | Status: DC

## 2025-01-18 MED ORDER — ONDANSETRON HCL 4 MG/2ML IJ SOLN
4.0000 mg | Freq: Once | INTRAMUSCULAR | Status: AC
  Administered 2025-01-18: 4 mg via INTRAVENOUS
  Filled 2025-01-18: qty 2

## 2025-01-18 MED ORDER — AMOXICILLIN-POT CLAVULANATE 875-125 MG PO TABS: 1.0000 | ORAL_TABLET | Freq: Two times a day (BID) | ORAL | 0 refills | Status: AC

## 2025-01-18 MED ORDER — AMOXICILLIN-POT CLAVULANATE 875-125 MG PO TABS
1.0000 | ORAL_TABLET | Freq: Once | ORAL | Status: AC
  Administered 2025-01-18: 1 via ORAL
  Filled 2025-01-18: qty 1

## 2025-01-18 MED ORDER — ONDANSETRON HCL 4 MG PO TABS: 4.0000 mg | ORAL_TABLET | Freq: Four times a day (QID) | ORAL | 0 refills | Status: AC

## 2025-01-18 NOTE — ED Triage Notes (Signed)
 Pt reports she has had abdominal pain that started yesterday afternoon in the right and left lower quadrant. Pt states pain felt like she was unable to have a bm or pass gas. Pt states she feels she is able to pass gass today and it felt wonderful. Pt states loose stools this am. Pt reports nausea no vomiting. Pt states any po intake is painful.

## 2025-01-18 NOTE — ED Notes (Signed)
 Pt taken to CT at this time.

## 2025-01-18 NOTE — ED Provider Notes (Signed)
 "  EMERGENCY DEPARTMENT AT [facility] Provider Note   CSN: [id number] Arrival date & time: 01/18/25  9152     Patient presents with: Abdominal Pain   Brandi Farley is a 64 y.o. female with a history of type 2 diabetes, GERD, IBS, history of diverticulitis and history of ovarian cysts, surgical history significant for appendectomy and cholecystectomy presenting with suspected diverticulitis.  She describes abdominal pain in her left lower abdomen which started yesterday afternoon.  She has a full sensation there and feels like she needs to have a bowel movement or pass gas, she notes having loose stools this morning but has had no significant improvement in her symptoms.  She has had some nausea, no vomiting, has had poor p.o. intake and food intake does increase pain.  She has had no fevers or chills, no vomiting.   The history is provided by the patient.       Prior to Admission medications  Medication Sig Start Date End Date Taking? Authorizing Provider  amoxicillin -clavulanate (AUGMENTIN ) 875-125 MG tablet Take 1 tablet by mouth every 12 (twelve) hours. 01/18/25  Yes Martino Tompson, Mliss, PA-C  ondansetron  (ZOFRAN ) 4 MG tablet Take 1 tablet (4 mg total) by mouth every 6 (six) hours. 01/18/25  Yes Ras Kollman, PA-C  aspirin  EC 81 MG tablet Take 1 tablet (81 mg total) by mouth daily. Swallow whole. 04/24/24   Alvan Dorn FALCON, MD  celecoxib (CELEBREX) 200 MG capsule     [provider]  EPINEPHrine  0.3 mg/0.3 mL IJ SOAJ injection Inject 0.3 mg into the muscle as needed for anaphylaxis.    [provider]  Evolocumab  (REPATHA  SURECLICK) 140 MG/ML SOAJ Inject 140 mg into the skin every 14 (fourteen) days. 11/23/24   Court Dorn PARAS, MD  gabapentin (NEURONTIN) 100 MG capsule Take 100 mg by mouth daily.    [provider]  mometasone (NASONEX) 50 MCG/ACT nasal spray Place 1 spray into the nose daily as needed for allergies. 02/03/22   [provider]  montelukast  (SINGULAIR ) 10 MG tablet TAKE 1 TABLET BY MOUTH ONCE A DAY AT BEDTIME. 03/11/20   Iva Marty Saltness, MD  omeprazole (PRILOSEC OTC) 20 MG tablet Take 20 mg by mouth daily.    [provider]  Rimegepant Sulfate (NURTEC) 75 MG TBDP Take 75 mg by mouth daily as needed (migraine).    [provider]  sertraline (ZOLOFT) 25 MG tablet Take 25 mg by mouth at bedtime.    [provider]  Vitamin D, Ergocalciferol, (DRISDOL) 1.25 MG (50000 UNIT) CAPS capsule Take 50,000 Units by mouth once a week. 07/16/20   [provider]    Allergies: Atorvastatin , Bactrim [sulfamethoxazole-trimethoprim], Doryx [doxycycline], Keflex [cephalexin], Alpha-gal, Iodinated contrast media, Other, Pravastatin , Wound dressing adhesive, and Sincalide     Review of Systems  Constitutional:  Negative for chills and fever.  HENT:  Negative for congestion and sore throat.   Eyes: Negative.   Respiratory:  Negative for chest tightness and shortness of breath.   Cardiovascular:  Negative for chest pain.  Gastrointestinal:  Positive for abdominal pain and nausea. Negative for vomiting.  Genitourinary: Negative.   Musculoskeletal:  Negative for arthralgias, joint swelling and neck pain.  Skin: Negative.  Negative for rash and wound.  Neurological:  Negative for dizziness, weakness, light-headedness, numbness and headaches.  Psychiatric/Behavioral: Negative.      Updated Vital Signs BP 122/75   Pulse 79   Temp 98.1 F (36.7 C) (Oral)  Resp 19   Ht 5' 7 (1.702 m)   Wt 102.1 kg   LMP 11/10/2018   SpO2 96%   BMI 35.24 kg/m   Physical Exam Vitals and nursing note reviewed.  Constitutional:      Appearance: She is well-developed.  HENT:     Head: Normocephalic and atraumatic.  Eyes:     Conjunctiva/sclera: Conjunctivae normal.  Cardiovascular:     Rate and Rhythm: Normal rate and regular rhythm.     Heart sounds: Normal heart sounds.  Pulmonary:      Effort: Pulmonary effort is normal.     Breath sounds: Normal breath sounds. No wheezing.  Abdominal:     General: Bowel sounds are normal.     Palpations: Abdomen is soft.     Tenderness: There is abdominal tenderness in the left lower quadrant. There is no guarding.  Musculoskeletal:        General: Normal range of motion.     Cervical back: Normal range of motion.  Skin:    General: Skin is warm and dry.  Neurological:     Mental Status: She is alert.     (all labs ordered are listed, but only abnormal results are displayed) Labs Reviewed  COMPREHENSIVE METABOLIC PANEL WITH GFR - Abnormal; Notable for the following components:      Result Value   Glucose, Bld 103 (*)    BUN 7 (*)    All other components within normal limits  CBC - Abnormal; Notable for the following components:   WBC 13.1 (*)    All other components within normal limits  URINALYSIS, ROUTINE W REFLEX MICROSCOPIC - Abnormal; Notable for the following components:   APPearance HAZY (*)    Ketones, ur 5 (*)    All other components within normal limits  LIPASE, BLOOD    EKG: EKG Interpretation Date/Time:  Friday January 18 2025 09:17:13 EST Ventricular Rate:  73 PR Interval:  153 QRS Duration:  140 QT Interval:  427 QTC Calculation: 471 R Axis:   15  Text Interpretation: Sinus rhythm Right bundle branch block Confirmed by Darra Chew [phone]) on 01/19/2025 5:51:49 PM  Radiology: CT ABDOMEN PELVIS WO CONTRAST Result Date: 01/18/2025 CLINICAL DATA:  Left lower quadrant abdominal pain EXAM: CT ABDOMEN AND PELVIS WITHOUT CONTRAST TECHNIQUE: Multidetector CT imaging of the abdomen and pelvis was performed following the standard protocol without IV contrast. RADIATION DOSE REDUCTION: This exam was performed according to the departmental dose-optimization program which includes automated exposure control, adjustment of the mA and/or kV according to patient size and/or use of iterative reconstruction technique.  COMPARISON:  CT abdomen and pelvis dated 02/17/2023 FINDINGS: Lower chest: No focal consolidation or pulmonary nodule in the lung bases. No pleural effusion or pneumothorax demonstrated. Partially imaged heart size is normal. Hepatobiliary: No focal hepatic lesions. No intra or extrahepatic biliary ductal dilation. Cholecystectomy. Pancreas: No focal lesions or main ductal dilation. Spleen: Normal in size without focal abnormality. Adrenals/Urinary Tract: No adrenal nodules. No suspicious renal mass or hydronephrosis. Punctate nonobstructing left upper pole renal stone. No focal bladder wall thickening. Stomach/Bowel: Small hiatal hernia. Normal appearance of the stomach. Sigmoid diverticulitis on a background of extensive colonic diverticulosis. Appendix is not discretely seen. Vascular/Lymphatic: Aortic atherosclerosis. No enlarged abdominal or pelvic lymph nodes. Reproductive: Simple left adnexal cyst measures 3.5 cm (2:72). No specific follow-up imaging recommended. No right adnexal mass. Other: Trace pericolonic mesenteric stranding in the left lower quadrant. Mild thickening of the adjacent left lower quadrant  peritoneum. No free air or fluid collection. Musculoskeletal: No acute or abnormal lytic or blastic osseous lesions. IMPRESSION: 1. Acute uncomplicated sigmoid diverticulitis. 2. Punctate nonobstructing left upper pole renal stone. 3. Aortic Atherosclerosis (ICD10-I70.0). Electronically Signed   By: Limin  Xu M.D.   On: 01/18/2025 10:31     Procedures   Medications Ordered in the ED  ondansetron  (ZOFRAN ) injection 4 mg (4 mg Intravenous Given 01/18/25 0942)  morphine  (PF) 4 MG/ML injection 4 mg (4 mg Intravenous Given 01/18/25 0942)  amoxicillin -clavulanate (AUGMENTIN ) 875-125 MG per tablet 1 tablet (1 tablet Oral Given 01/18/25 1437)                                    Medical Decision Making Patient presenting with left lower quadrant abdominal pain, known history of diverticulitis, this  would be most likely source of her symptoms, other differential would include constipation, small bowel obstruction, abscess, ovarian cyst or torsion, kidney stone, acute cystitis, gastroenteritis, IBS exacerbation.  Labs and imaging as outlined below, patient confirmed diverticulitis without perforation or abscess.  She is started on Augmentin , Zofran  was given for nausea as well.  Home care was also discussed with patient, clear liquid diet initially, increasing as tolerated.  Close follow-up with her PCP for recheck, return here for any worsening symptoms, return precautions was discussed.  Amount and/or Complexity of Data Reviewed Labs: ordered.    Details: Labs reviewed, including urinalysis, c-Met which is unremarkable, she has a glucose of 103, her lipase is normal she does have a leukocytosis at 13.1 Radiology: ordered.    Details: CT scan confirming uncomplicated sigmoid diverticulitis.  Risk Prescription drug management.        Final diagnoses:  Diverticulitis    ED Discharge Orders          Ordered    amoxicillin -clavulanate (AUGMENTIN ) 875-125 MG tablet  Every 12 hours        01/18/25 1516    ondansetron  (ZOFRAN ) 4 MG tablet  Every 6 hours        01/18/25 1516               Afrika Brick, PA-C 01/19/25 2112  "

## 2025-01-18 NOTE — ED Notes (Signed)
 Pt reports she has a lot of pressure and gas. Provider made aware.

## 2025-01-18 NOTE — Discharge Instructions (Signed)
 Take the entire course of the antibiotics prescribed.  As discussed I recommend a clear liquid diet for at least the next 24 hours, if your symptoms are improving you can slowly increase your diet, make sure you are drinking plenty of fluids to stay hydrated.  It will be very important for you to return here if you have worsening pain, fever or uncontrolled vomiting, these can be symptoms of your infection getting worse rather than getting better.

## 2025-01-18 NOTE — ED Notes (Signed)
 Pt provided with fluids for PO challenge. See note above for updated result.
